# Patient Record
Sex: Female | Born: 1980
Health system: Southern US, Community
[De-identification: ages and names within clinical notes are randomized; demographics above are authoritative.]

## PROBLEM LIST (undated history)

## (undated) ENCOUNTER — Inpatient Hospital Stay (HOSPITAL_COMMUNITY): Payer: Self-pay

## (undated) DIAGNOSIS — J189 Pneumonia, unspecified organism: Secondary | ICD-10-CM

## (undated) DIAGNOSIS — T7840XA Allergy, unspecified, initial encounter: Secondary | ICD-10-CM

## (undated) DIAGNOSIS — J45909 Unspecified asthma, uncomplicated: Secondary | ICD-10-CM

## (undated) DIAGNOSIS — B009 Herpesviral infection, unspecified: Secondary | ICD-10-CM

## (undated) DIAGNOSIS — Z91030 Bee allergy status: Secondary | ICD-10-CM

## (undated) DIAGNOSIS — F419 Anxiety disorder, unspecified: Secondary | ICD-10-CM

## (undated) DIAGNOSIS — K219 Gastro-esophageal reflux disease without esophagitis: Secondary | ICD-10-CM

## (undated) DIAGNOSIS — Z8619 Personal history of other infectious and parasitic diseases: Secondary | ICD-10-CM

## (undated) HISTORY — DX: Gastro-esophageal reflux disease without esophagitis: K21.9

## (undated) HISTORY — DX: Herpesviral infection, unspecified: B00.9

## (undated) HISTORY — DX: Bee allergy status: Z91.030

## (undated) HISTORY — PX: GASTRIC BYPASS: SHX52

## (undated) HISTORY — DX: Anxiety disorder, unspecified: F41.9

## (undated) HISTORY — DX: Allergy, unspecified, initial encounter: T78.40XA

## (undated) HISTORY — PX: ABDOMINOPLASTY: SUR9

## (undated) HISTORY — DX: Unspecified asthma, uncomplicated: J45.909

## (undated) HISTORY — DX: Personal history of other infectious and parasitic diseases: Z86.19

---

## 2008-05-07 HISTORY — PX: KNEE ARTHROSCOPY: SHX127

## 2009-05-07 HISTORY — PX: ANKLE SURGERY: SHX546

## 2010-08-04 DIAGNOSIS — N926 Irregular menstruation, unspecified: Secondary | ICD-10-CM | POA: Insufficient documentation

## 2013-05-26 DIAGNOSIS — D251 Intramural leiomyoma of uterus: Secondary | ICD-10-CM | POA: Insufficient documentation

## 2015-07-06 HISTORY — PX: GASTRIC BYPASS: SHX52

## 2017-02-21 ENCOUNTER — Ambulatory Visit (INDEPENDENT_AMBULATORY_CARE_PROVIDER_SITE_OTHER): Payer: Managed Care, Other (non HMO) | Admitting: Physician Assistant

## 2017-02-21 ENCOUNTER — Encounter: Payer: Self-pay | Admitting: Physician Assistant

## 2017-02-21 DIAGNOSIS — J019 Acute sinusitis, unspecified: Secondary | ICD-10-CM | POA: Insufficient documentation

## 2017-02-21 DIAGNOSIS — B9689 Other specified bacterial agents as the cause of diseases classified elsewhere: Secondary | ICD-10-CM | POA: Insufficient documentation

## 2017-02-21 MED ORDER — AMOXICILLIN-POT CLAVULANATE 875-125 MG PO TABS: 1.0000 | ORAL_TABLET | Freq: Two times a day (BID) | ORAL | 0 refills | Status: DC

## 2017-02-21 NOTE — Progress Notes (Signed)
Pre visit review using our clinic review tool, if applicable. No additional management support is needed unless otherwise documented below in the visit note. 

## 2017-02-21 NOTE — Assessment & Plan Note (Signed)
Rx Augmentin.  Increase fluids.  Rest.  Saline nasal spray.  Probiotic.  Mucinex as directed.  Humidifier in bedroom. Allegra-D as instructed.  Call or return to clinic if symptoms are not improving.

## 2017-02-21 NOTE — Progress Notes (Signed)
Patient presents to clinic today to establish care.  Acute Concerns: Patient c/o 3 weeks of sinus pressure, nasal congestion, sinus pain, sinus headache, bilateral ear pain and occasional dizziness. Denies fever, chills, chest pain or SOB. Denies chest congestion, chest pain or SOB.  Is taking Allegra and Tylenol for sinus symptoms. Is limited due to history of Gastric Bypass.   Chronic Issues: Seasonal Allergies -- Allegra twice daily with relief of symptoms. Is followed by Allergy.   Health Maintenance: Immunizations -- Declines flu shot. States she never gets. Is unsure of last Tetanus. Will need records. Declines updated immunizations. PAP -- Patient endorses having in August of this year with GYN. Normal per patient.   Past Medical History:  Diagnosis Date  . Allergy    Seasonal  . Asthma   . Bee sting allergy   . History of chickenpox     Past Surgical History:  Procedure Laterality Date  . ANKLE SURGERY Right 2011  . GASTRIC BYPASS  07/2015  . KNEE ARTHROSCOPY Left 2010    No current outpatient prescriptions on file prior to visit.   No current facility-administered medications on file prior to visit.     Allergies  Allergen Reactions  . Naproxen Other (See Comments)    Hematuria when taken in large doses  . Tape Rash    Family History  Problem Relation Age of Onset  . Alcohol abuse Mother   . Heart attack Father 62  . Asthma Brother   . Diabetes Brother     Social History   Social History  . Marital status: Married    Spouse name: N/A  . Number of children: 2  . Years of education: 16   Occupational History  . Administration    Social History Main Topics  . Smoking status: Never Smoker  . Smokeless tobacco: Never Used  . Alcohol use Yes     Comment: Maybe 1 drink per week.  . Drug use: No  . Sexual activity: Yes    Birth control/ protection: Inserts     Comment: Nuvaring   Other Topics Concern  . Not on file   Social History  Narrative  . No narrative on file   Review of Systems  Constitutional: Negative for fever and weight loss.  HENT: Positive for congestion, nosebleeds and sinus pain. Negative for ear discharge, ear pain, hearing loss and tinnitus.   Eyes: Negative for blurred vision, double vision, photophobia and pain.  Respiratory: Positive for cough. Negative for shortness of breath.   Cardiovascular: Negative for chest pain and palpitations.  Gastrointestinal: Negative for abdominal pain, blood in stool, constipation, diarrhea, heartburn, melena, nausea and vomiting.  Genitourinary: Negative for dysuria, flank pain, frequency, hematuria and urgency.  Musculoskeletal: Negative for falls.  Neurological: Positive for dizziness and headaches. Negative for loss of consciousness.  Endo/Heme/Allergies: Negative for environmental allergies.  Psychiatric/Behavioral: Negative for depression, hallucinations, substance abuse and suicidal ideas. The patient is not nervous/anxious and does not have insomnia.    BP 120/86   Pulse 62   Temp 98.4 F (36.9 C) (Oral)   Resp 14   Ht 5' 5.75" (1.67 m)   Wt 226 lb (102.5 kg)   SpO2 98%   BMI 36.76 kg/m   Physical Exam  Constitutional: She is oriented to person, place, and time and well-developed, well-nourished, and in no distress.  HENT:  Head: Normocephalic and atraumatic.  Right Ear: A middle ear effusion is present.  Left Ear: A middle ear  effusion is present.  Nose: Mucosal edema and rhinorrhea present. Right sinus exhibits maxillary sinus tenderness. Left sinus exhibits maxillary sinus tenderness.  Mouth/Throat: Uvula is midline, oropharynx is clear and moist and mucous membranes are normal.  Neck: Neck supple.  Cardiovascular: Normal rate, regular rhythm, normal heart sounds and intact distal pulses.   Pulmonary/Chest: Effort normal and breath sounds normal. No respiratory distress. She has no wheezes. She has no rales. She exhibits no tenderness.    Lymphadenopathy:    She has no cervical adenopathy.  Neurological: She is alert and oriented to person, place, and time.  Skin: Skin is warm and dry. No rash noted.  Psychiatric: Affect normal.  Vitals reviewed.  Assessment/Plan: Acute bacterial sinusitis Rx Augmentin.  Increase fluids.  Rest.  Saline nasal spray.  Probiotic.  Mucinex as directed.  Humidifier in bedroom. Allegra-D as instructed.  Call or return to clinic if symptoms are not improving.     Leeanne Rio, PA-C

## 2017-02-21 NOTE — Patient Instructions (Signed)
Please take antibiotic as directed.  Increase fluid intake.  Use Saline nasal spray.  Take a daily multivitamin. Switch to Allegra-D for now.  Place a humidifier in the bedroom.  Please call or return clinic if symptoms are not improving.  Sinusitis Sinusitis is redness, soreness, and swelling (inflammation) of the paranasal sinuses. Paranasal sinuses are air pockets within the bones of your face (beneath the eyes, the middle of the forehead, or above the eyes). In healthy paranasal sinuses, mucus is able to drain out, and air is able to circulate through them by way of your nose. However, when your paranasal sinuses are inflamed, mucus and air can become trapped. This can allow bacteria and other germs to grow and cause infection. Sinusitis can develop quickly and last only a short time (acute) or continue over a long period (chronic). Sinusitis that lasts for more than 12 weeks is considered chronic.  CAUSES  Causes of sinusitis include:  Allergies.  Structural abnormalities, such as displacement of the cartilage that separates your nostrils (deviated septum), which can decrease the air flow through your nose and sinuses and affect sinus drainage.  Functional abnormalities, such as when the small hairs (cilia) that line your sinuses and help remove mucus do not work properly or are not present. SYMPTOMS  Symptoms of acute and chronic sinusitis are the same. The primary symptoms are pain and pressure around the affected sinuses. Other symptoms include:  Upper toothache.  Earache.  Headache.  Bad breath.  Decreased sense of smell and taste.  A cough, which worsens when you are lying flat.  Fatigue.  Fever.  Thick drainage from your nose, which often is green and may contain pus (purulent).  Swelling and warmth over the affected sinuses. DIAGNOSIS  Your caregiver will perform a physical exam. During the exam, your caregiver may:  Look in your nose for signs of abnormal growths in  your nostrils (nasal polyps).  Tap over the affected sinus to check for signs of infection.  View the inside of your sinuses (endoscopy) with a special imaging device with a light attached (endoscope), which is inserted into your sinuses. If your caregiver suspects that you have chronic sinusitis, one or more of the following tests may be recommended:  Allergy tests.  Nasal culture A sample of mucus is taken from your nose and sent to a lab and screened for bacteria.  Nasal cytology A sample of mucus is taken from your nose and examined by your caregiver to determine if your sinusitis is related to an allergy. TREATMENT  Most cases of acute sinusitis are related to a viral infection and will resolve on their own within 10 days. Sometimes medicines are prescribed to help relieve symptoms (pain medicine, decongestants, nasal steroid sprays, or saline sprays).  However, for sinusitis related to a bacterial infection, your caregiver will prescribe antibiotic medicines. These are medicines that will help kill the bacteria causing the infection.  Rarely, sinusitis is caused by a fungal infection. In theses cases, your caregiver will prescribe antifungal medicine. For some cases of chronic sinusitis, surgery is needed. Generally, these are cases in which sinusitis recurs more than 3 times per year, despite other treatments. HOME CARE INSTRUCTIONS   Drink plenty of water. Water helps thin the mucus so your sinuses can drain more easily.  Use a humidifier.  Inhale steam 3 to 4 times a day (for example, sit in the bathroom with the shower running).  Apply a warm, moist washcloth to your face 3  to 4 times a day, or as directed by your caregiver.  Use saline nasal sprays to help moisten and clean your sinuses.  Take over-the-counter or prescription medicines for pain, discomfort, or fever only as directed by your caregiver. SEEK IMMEDIATE MEDICAL CARE IF:  You have increasing pain or severe  headaches.  You have nausea, vomiting, or drowsiness.  You have swelling around your face.  You have vision problems.  You have a stiff neck.  You have difficulty breathing. MAKE SURE YOU:   Understand these instructions.  Will watch your condition.  Will get help right away if you are not doing well or get worse. Document Released: 04/23/2005 Document Revised: 07/16/2011 Document Reviewed: 05/08/2011 [facility] Patient Information 2014 [place], [place].

## 2017-04-22 ENCOUNTER — Telehealth: Payer: Self-pay | Admitting: Physician Assistant

## 2017-04-22 NOTE — Telephone Encounter (Signed)
Copied from [place] [id number]. Topic: Quick Communication - See Telephone Encounter >> Apr 22, 2017  4:42 PM Boyd Kerbs wrote: CRM for notification. See Telephone encounter for:  Patient called saying she is still having ear pain, eyes, nose pain, headaches, drainage.   [id number] - [place], [place] - [address] [place] [address] Phone: [phone] Fax: [phone]  Patient is waiting for callback.  Not sure if needs to come back in or if can call in another prescription   04/22/17.

## 2017-04-22 NOTE — Telephone Encounter (Signed)
Patient wanted to be seen tomorrow.   Scheduled her an acute appt with Dr. Jonni Sanger tomorrow since PCP is not available.

## 2017-04-23 ENCOUNTER — Ambulatory Visit: Payer: Managed Care, Other (non HMO) | Admitting: Family Medicine

## 2017-04-23 ENCOUNTER — Encounter: Payer: Self-pay | Admitting: Family Medicine

## 2017-04-23 VITALS — BP 120/80 | HR 67 | Temp 98.8°F | Ht 65.75 in | Wt 228.5 lb

## 2017-04-23 DIAGNOSIS — H1013 Acute atopic conjunctivitis, bilateral: Secondary | ICD-10-CM | POA: Diagnosis not present

## 2017-04-23 DIAGNOSIS — J329 Chronic sinusitis, unspecified: Secondary | ICD-10-CM | POA: Diagnosis not present

## 2017-04-23 DIAGNOSIS — J3089 Other allergic rhinitis: Secondary | ICD-10-CM | POA: Diagnosis not present

## 2017-04-23 MED ORDER — OLOPATADINE HCL 0.1 % OP SOLN: 1.0000 [drp] | Freq: Two times a day (BID) | OPHTHALMIC | 1 refills | Status: DC

## 2017-04-23 MED ORDER — DOXYCYCLINE HYCLATE 100 MG PO TABS
100.0000 mg | ORAL_TABLET | Freq: Two times a day (BID) | ORAL | 0 refills | Status: AC
Start: 2017-04-23 — End: 2017-04-30

## 2017-04-23 MED ORDER — PREDNISONE 10 MG PO TABS: ORAL_TABLET | ORAL | 0 refills | Status: DC

## 2017-04-23 NOTE — Progress Notes (Signed)
Subjective   CC:  Chief Complaint  Patient presents with  . Sinus Problem  . BIlateral ear pain  . Nasal Congestion    draining  . Headache    HPI: Zoe Soto is a 36 y.o. female who presents to the office today to address the problems listed above in the chief complaint.  Patient reports sinus congestion and pressure with thick drainage, mild nonproductive cough, ear pressure without pain, and mild malaise.  Symptoms have been present since oct 18th; she was treated at that time with augmentin for an acute sinus infection, however reports NO significant improvement in her sxs. She c/o right sinu pain since yesterday w/o dental pain or purulent drainage.  She denies high fevers, GI symptoms, shortness of breath. Shehas had sinus infections in the past and this feels similar. She has moderate to severe allergies; formerly had immunotherapy. Has never been evaluated by ENT and reports taking 5-6 rounds of abx per year for "sinus infections"  Patient is a non-smoker.  No active asthma sxs. Eyes are itching as well. She is on multiple allergy medications  I reviewed the patients updated PMH, FH, and SocHx.    Patient Active Problem List   Diagnosis Date Noted  . Acute bacterial sinusitis 02/21/2017  . Intramural leiomyoma of uterus 05/26/2013  . Irregular menstruation 08/04/2010   Current Meds  Medication Sig  . albuterol (PROVENTIL HFA;VENTOLIN HFA) 108 (90 Base) MCG/ACT inhaler Inhale 2 puffs into the lungs as needed.  . Biotin 1 MG CAPS Take 2 capsules by mouth daily.  . budesonide-formoterol (SYMBICORT) 160-4.5 MCG/ACT inhaler Inhale 2 puffs into the lungs as needed.   . Calcium Carbonate-Vitamin D (CALCIUM 600+D) 600-200 MG-UNIT TABS Take 1 tablet by mouth daily.  . cyanocobalamin (V-R VITAMIN B-12) 500 MCG tablet Take 1 tablet by mouth daily.  Marland Kitchen EPINEPHrine 0.3 mg/0.3 mL IJ SOAJ injection Inject 0.3 mg by intramuscular injection one time only.  . ferrous sulfate 325 (65 FE)  MG tablet Take 1 tablet by mouth daily.  . fexofenadine (ALLEGRA) 180 MG tablet TAKE 2 TABLETS BY MOUTH TWICE DAILY  . Multiple Vitamin (MULTI-VITAMINS) TABS Take 1 Tab by mouth daily.  Marland Kitchen NUVARING 0.12-0.015 MG/24HR vaginal ring INSERT 1 RING VAGINALLY FOR 3 WEEKS, THEN REMOVE FOR 1 WEEK, THEN REPEAT    Review of Systems: Cardiovascular: negative for chest pain Respiratory: negative for SOB or persistent cough Gastrointestinal: negative for abdominal pain Genitourinary: negative for dysuria or gross hematuria  Objective  Vitals: BP 120/80 (BP Location: Left Arm, Patient Position: Sitting, Cuff Size: Large)   Pulse 67   Temp 98.8 F (37.1 C) (Oral)   Ht 5' 5.75" (1.67 m)   Wt 228 lb 8 oz (103.6 kg)   LMP 03/31/2017   SpO2 97%   BMI 37.16 kg/m  General: no acute distress  Psych:  Alert and oriented, normal mood and affect HEENT:  Normocephalic, atraumatic, conjunctiva are clear bilaterally, TMs with serous effusions or retraction w/o erythema, nasal mucosa is pink without purulent drainage, tender maxillary sinus present, OP mild erythematous w/o eudate, supple neck without LAD Cardiovascular:  RRR without murmur or gallop. no peripheral edema Respiratory:  Good breath sounds bilaterally, CTAB with normal respiratory effort Skin:  Warm, no rashes   Assessment  1. Recurrent sinus infections   2. Non-seasonal allergic rhinitis, unspecified trigger   3. Acute atopic conjunctivitis of both eyes      Plan   Sinusitis: History and exam is most consistent  with allergies +/- a recurrent bacterial sinus infection.  Etiology and prognosis discussed with patient. It is difficult to discern at this point.  Recommend antibiotics as ordered below.  Patient to complete course of antibiotics, use supportive medications like mucolytics and decongestants as needed.  Add prednisone and refer to ENT for further evaluation. I recommend reconsidering allergy treatments if ENT eval is negative. May use  Tylenol or Advil if needed.    Follow up: as needed    Commons side effects, risks, benefits, and alternatives for medications and treatment plan prescribed today were discussed, and the patient expressed understanding of the given instructions. Patient is instructed to call or message via MyChart if he/she has any questions or concerns regarding our treatment plan. No barriers to understanding were identified. We discussed Red Flag symptoms and signs in detail. Patient expressed understanding regarding what to do in case of urgent or emergency type symptoms.   Medication list was reconciled, printed and provided to the patient in AVS. Patient instructions and summary information was reviewed with the patient as documented in the AVS. This note was prepared with assistance of Dragon voice recognition software. Occasional wrong-word or sound-a-like substitutions may have occurred due to the inherent limitations of voice recognition software  Orders Placed This Encounter  Procedures  . Ambulatory referral to ENT   Meds ordered this encounter  Medications  . doxycycline (VIBRA-TABS) 100 MG tablet    Sig: Take 1 tablet (100 mg total) by mouth 2 (two) times daily for 7 days.    Dispense:  14 tablet    Refill:  0  . predniSONE (DELTASONE) 10 MG tablet    Sig: Take 4 tabs qd x 2 days, 3 qd x 2 days, 2 qd x 2d, 1qd x 3 days    Dispense:  21 tablet    Refill:  0  . olopatadine (PATANOL) 0.1 % ophthalmic solution    Sig: Place 1 drop into both eyes 2 (two) times daily.    Dispense:  5 mL    Refill:  1

## 2017-04-23 NOTE — Patient Instructions (Signed)
We will call you with information regarding your referral appointment. ENT specialist  Consider getting back to an allergist as well.

## 2017-05-31 ENCOUNTER — Ambulatory Visit: Payer: Managed Care, Other (non HMO) | Admitting: Physician Assistant

## 2017-05-31 ENCOUNTER — Encounter: Payer: Self-pay | Admitting: Physician Assistant

## 2017-05-31 ENCOUNTER — Other Ambulatory Visit: Payer: Self-pay

## 2017-05-31 VITALS — BP 110/80 | HR 62 | Temp 98.3°F | Resp 14 | Ht 66.0 in | Wt 224.0 lb

## 2017-05-31 DIAGNOSIS — J069 Acute upper respiratory infection, unspecified: Secondary | ICD-10-CM | POA: Diagnosis not present

## 2017-05-31 LAB — POC INFLUENZA A&B (BINAX/QUICKVUE)
INFLUENZA A, POC: NEGATIVE
INFLUENZA B, POC: NEGATIVE

## 2017-05-31 NOTE — Progress Notes (Signed)
Patient presents to clinic today c/o 2 days of sinus pressure, nasal congestion and fatigue. Endorses dry cough. Denies fever, chills, sinus pain, ear pain or tooth pain. Denies chest pain or SOB. Has not taken anything for symptoms.  Past Medical History:  Diagnosis Date  . Allergy    Seasonal  . Asthma   . Bee sting allergy   . History of chickenpox     Current Outpatient Medications on File Prior to Visit  Medication Sig Dispense Refill  . albuterol (PROVENTIL HFA;VENTOLIN HFA) 108 (90 Base) MCG/ACT inhaler Inhale 2 puffs into the lungs as needed.    . Biotin 1 MG CAPS Take 2 capsules by mouth daily.    . budesonide-formoterol (SYMBICORT) 160-4.5 MCG/ACT inhaler Inhale 2 puffs into the lungs as needed.     . Calcium Carbonate-Vitamin D (CALCIUM 600+D) 600-200 MG-UNIT TABS Take 1 tablet by mouth daily.    . cyanocobalamin (V-R VITAMIN B-12) 500 MCG tablet Take 1 tablet by mouth daily.    Marland Kitchen EPINEPHrine 0.3 mg/0.3 mL IJ SOAJ injection Inject 0.3 mg by intramuscular injection one time only.    . ferrous sulfate 325 (65 FE) MG tablet Take 1 tablet by mouth daily.    . fexofenadine (ALLEGRA) 180 MG tablet TAKE 2 TABLETS BY MOUTH TWICE DAILY    . Multiple Vitamin (MULTI-VITAMINS) TABS Take 1 Tab by mouth daily.    Marland Kitchen NUVARING 0.12-0.015 MG/24HR vaginal ring INSERT 1 RING VAGINALLY FOR 3 WEEKS, THEN REMOVE FOR 1 WEEK, THEN REPEAT  11  . olopatadine (PATANOL) 0.1 % ophthalmic solution Place 1 drop into both eyes 2 (two) times daily. 5 mL 1   No current facility-administered medications on file prior to visit.     Allergies  Allergen Reactions  . Naproxen Other (See Comments)    Hematuria when taken in large doses  . Tape Rash    Family History  Problem Relation Age of Onset  . Alcohol abuse Mother   . Heart attack Father 3  . Asthma Brother   . Diabetes Brother     Social History   Socioeconomic History  . Marital status: Married    Spouse name: None  . Number of  children: 2  . Years of education: 65  . Highest education level: None  Social Needs  . Financial resource strain: None  . Food insecurity - worry: None  . Food insecurity - inability: None  . Transportation needs - medical: None  . Transportation needs - non-medical: None  Occupational History  . Occupation: Administration  Tobacco Use  . Smoking status: Never Smoker  . Smokeless tobacco: Never Used  Substance and Sexual Activity  . Alcohol use: Yes    Comment: Maybe 1 drink per week.  . Drug use: No  . Sexual activity: Yes    Birth control/protection: Inserts    Comment: Nuvaring  Other Topics Concern  . None  Social History Narrative  . None   Review of Systems - See HPI.  All other ROS are negative.  BP 110/80   Pulse 62   Temp 98.3 F (36.8 C) (Oral)   Resp 14   Ht 5\' 6"  (1.676 m)   Wt 224 lb (101.6 kg)   SpO2 98%   BMI 36.15 kg/m   Physical Exam  Constitutional: She is oriented to person, place, and time and well-developed, well-nourished, and in no distress.  HENT:  Head: Normocephalic and atraumatic.  Right Ear: External ear normal.  Left  Ear: External ear normal.  Nose: Nose normal.  Mouth/Throat: Oropharynx is clear and moist. No oropharyngeal exudate.  TM within normal limits bilaterally.   Eyes: Conjunctivae are normal.  Cardiovascular: Normal rate, regular rhythm, normal heart sounds and intact distal pulses.  Pulmonary/Chest: Effort normal and breath sounds normal. No respiratory distress. She has no wheezes. She has no rales. She exhibits no tenderness.  Neurological: She is alert and oriented to person, place, and time.  Skin: Skin is warm and dry. No rash noted.  Vitals reviewed.  Assessment/Plan: 1. Viral URI Flu swab negative. Reviewed supportive measures and OTC medications. Follow-up discussed. - POC Influenza A&B(BINAX/QUICKVUE)   Leeanne Rio, PA-C

## 2017-05-31 NOTE — Patient Instructions (Signed)
Please stay well-hydrated and get plenty of rest.  Continue chronic allergy medications adding on Flonase. Tylenol sinus if needed for headache and congestion.  If symptoms are not improving or if anything worsens, or new symptoms develop, please give [place] a call.

## 2017-07-12 ENCOUNTER — Other Ambulatory Visit: Payer: Self-pay

## 2017-07-12 ENCOUNTER — Encounter: Payer: Self-pay | Admitting: Physician Assistant

## 2017-07-12 ENCOUNTER — Ambulatory Visit: Payer: Managed Care, Other (non HMO) | Admitting: Physician Assistant

## 2017-07-12 ENCOUNTER — Other Ambulatory Visit: Payer: Self-pay | Admitting: Physician Assistant

## 2017-07-12 DIAGNOSIS — B9689 Other specified bacterial agents as the cause of diseases classified elsewhere: Secondary | ICD-10-CM | POA: Diagnosis not present

## 2017-07-12 DIAGNOSIS — J019 Acute sinusitis, unspecified: Secondary | ICD-10-CM | POA: Diagnosis not present

## 2017-07-12 MED ORDER — DOXYCYCLINE HYCLATE 100 MG PO CAPS
100.0000 mg | ORAL_CAPSULE | Freq: Two times a day (BID) | ORAL | 0 refills | Status: DC
Start: 2017-07-12 — End: 2017-07-16

## 2017-07-12 MED ORDER — AZELASTINE HCL 0.1 % NA SOLN: 2.0000 | Freq: Two times a day (BID) | NASAL | 12 refills | Status: DC

## 2017-07-12 NOTE — Patient Instructions (Signed)
Please take antibiotic as directed.  Increase fluid intake.  Use Saline nasal spray or Neti Pot with distilled water. Take a daily multivitamin. Restart Allegra twice daily. I have sent in a prescription for Astelin spray to use as directed.  Place a humidifier in the bedroom.  Please call or return clinic if symptoms are not improving.  Sinusitis Sinusitis is redness, soreness, and swelling (inflammation) of the paranasal sinuses. Paranasal sinuses are air pockets within the bones of your face (beneath the eyes, the middle of the forehead, or above the eyes). In healthy paranasal sinuses, mucus is able to drain out, and air is able to circulate through them by way of your nose. However, when your paranasal sinuses are inflamed, mucus and air can become trapped. This can allow bacteria and other germs to grow and cause infection. Sinusitis can develop quickly and last only a short time (acute) or continue over a long period (chronic). Sinusitis that lasts for more than 12 weeks is considered chronic.  CAUSES  Causes of sinusitis include:  Allergies.  Structural abnormalities, such as displacement of the cartilage that separates your nostrils (deviated septum), which can decrease the air flow through your nose and sinuses and affect sinus drainage.  Functional abnormalities, such as when the small hairs (cilia) that line your sinuses and help remove mucus do not work properly or are not present. SYMPTOMS  Symptoms of acute and chronic sinusitis are the same. The primary symptoms are pain and pressure around the affected sinuses. Other symptoms include:  Upper toothache.  Earache.  Headache.  Bad breath.  Decreased sense of smell and taste.  A cough, which worsens when you are lying flat.  Fatigue.  Fever.  Thick drainage from your nose, which often is green and may contain pus (purulent).  Swelling and warmth over the affected sinuses. DIAGNOSIS  Your caregiver will perform a  physical exam. During the exam, your caregiver may:  Look in your nose for signs of abnormal growths in your nostrils (nasal polyps).  Tap over the affected sinus to check for signs of infection.  View the inside of your sinuses (endoscopy) with a special imaging device with a light attached (endoscope), which is inserted into your sinuses. If your caregiver suspects that you have chronic sinusitis, one or more of the following tests may be recommended:  Allergy tests.  Nasal culture A sample of mucus is taken from your nose and sent to a lab and screened for bacteria.  Nasal cytology A sample of mucus is taken from your nose and examined by your caregiver to determine if your sinusitis is related to an allergy. TREATMENT  Most cases of acute sinusitis are related to a viral infection and will resolve on their own within 10 days. Sometimes medicines are prescribed to help relieve symptoms (pain medicine, decongestants, nasal steroid sprays, or saline sprays).  However, for sinusitis related to a bacterial infection, your caregiver will prescribe antibiotic medicines. These are medicines that will help kill the bacteria causing the infection.  Rarely, sinusitis is caused by a fungal infection. In theses cases, your caregiver will prescribe antifungal medicine. For some cases of chronic sinusitis, surgery is needed. Generally, these are cases in which sinusitis recurs more than 3 times per year, despite other treatments. HOME CARE INSTRUCTIONS   Drink plenty of water. Water helps thin the mucus so your sinuses can drain more easily.  Use a humidifier.  Inhale steam 3 to 4 times a day (for example, sit  in the bathroom with the shower running).  Apply a warm, moist washcloth to your face 3 to 4 times a day, or as directed by your caregiver.  Use saline nasal sprays to help moisten and clean your sinuses.  Take over-the-counter or prescription medicines for pain, discomfort, or fever only  as directed by your caregiver. SEEK IMMEDIATE MEDICAL CARE IF:  You have increasing pain or severe headaches.  You have nausea, vomiting, or drowsiness.  You have swelling around your face.  You have vision problems.  You have a stiff neck.  You have difficulty breathing. MAKE SURE YOU:   Understand these instructions.  Will watch your condition.  Will get help right away if you are not doing well or get worse. Document Released: 04/23/2005 Document Revised: 07/16/2011 Document Reviewed: 05/08/2011 [facility] Patient Information 2014 [place], [place].

## 2017-07-12 NOTE — Assessment & Plan Note (Signed)
Rx Doxycycline.  Increase fluids.  Rest.  Saline nasal spray.  Probiotic.  Mucinex as directed.  Humidifier in bedroom. Restart Allegra BID.  Call or return to clinic if symptoms are not improving as ENT may be needed.

## 2017-07-12 NOTE — Progress Notes (Signed)
Patient presents to clinic today c/o several weeks of sinus pressure/sinus pain, maxillary pain, sore throat, headache. Denies fever, but notes chills and some fatigue. Denies chest pain or SOB. Has year-round allergies, currently on Allegra. Is supposed to take BID but only takes once daily.   Past Medical History:  Diagnosis Date  . Allergy    Seasonal  . Asthma   . Bee sting allergy   . History of chickenpox     Current Outpatient Medications on File Prior to Visit  Medication Sig Dispense Refill  . albuterol (PROVENTIL HFA;VENTOLIN HFA) 108 (90 Base) MCG/ACT inhaler Inhale 2 puffs into the lungs as needed.    . Biotin 1 MG CAPS Take 2 capsules by mouth daily.    . budesonide-formoterol (SYMBICORT) 160-4.5 MCG/ACT inhaler Inhale 2 puffs into the lungs as needed.     . Calcium Carbonate-Vitamin D (CALCIUM 600+D) 600-200 MG-UNIT TABS Take 1 tablet by mouth daily.    . cyanocobalamin (V-R VITAMIN B-12) 500 MCG tablet Take 1 tablet by mouth daily.    Marland Kitchen EPINEPHrine 0.3 mg/0.3 mL IJ SOAJ injection Inject 0.3 mg by intramuscular injection one time only.    . ferrous sulfate 325 (65 FE) MG tablet Take 1 tablet by mouth daily.    . fexofenadine (ALLEGRA) 180 MG tablet TAKE 2 TABLETS BY MOUTH TWICE DAILY    . Multiple Vitamin (MULTI-VITAMINS) TABS Take 1 Tab by mouth daily.    Marland Kitchen NUVARING 0.12-0.015 MG/24HR vaginal ring INSERT 1 RING VAGINALLY FOR 3 WEEKS, THEN REMOVE FOR 1 WEEK, THEN REPEAT  11  . olopatadine (PATANOL) 0.1 % ophthalmic solution Place 1 drop into both eyes 2 (two) times daily. 5 mL 1   No current facility-administered medications on file prior to visit.     Allergies  Allergen Reactions  . Naproxen Other (See Comments)    Hematuria when taken in large doses  . Tape Rash    Family History  Problem Relation Age of Onset  . Alcohol abuse Mother   . Heart attack Father 86  . Asthma Brother   . Diabetes Brother     Social History   Socioeconomic History  .  Marital status: Married    Spouse name: None  . Number of children: 2  . Years of education: 63  . Highest education level: None  Social Needs  . Financial resource strain: None  . Food insecurity - worry: None  . Food insecurity - inability: None  . Transportation needs - medical: None  . Transportation needs - non-medical: None  Occupational History  . Occupation: Administration  Tobacco Use  . Smoking status: Never Smoker  . Smokeless tobacco: Never Used  Substance and Sexual Activity  . Alcohol use: Yes    Comment: Maybe 1 drink per week.  . Drug use: No  . Sexual activity: Yes    Birth control/protection: Inserts    Comment: Nuvaring  Other Topics Concern  . None  Social History Narrative  . None   Review of Systems - See HPI.  All other ROS are negative.  BP 120/84   Pulse 62   Temp 97.6 F (36.4 C) (Oral)   Resp 16   Ht 5\' 6"  (1.676 m)   Wt 229 lb (103.9 kg)   SpO2 99%   BMI 36.96 kg/m   Physical Exam  Constitutional: She is oriented to person, place, and time and well-developed, well-nourished, and in no distress.  HENT:  Head: Normocephalic and atraumatic.  Right Ear: Tympanic membrane normal.  Left Ear: Tympanic membrane normal.  Nose: Mucosal edema and rhinorrhea present. Right sinus exhibits maxillary sinus tenderness. Right sinus exhibits no frontal sinus tenderness. Left sinus exhibits maxillary sinus tenderness. Left sinus exhibits no frontal sinus tenderness.  Mouth/Throat: Uvula is midline, oropharynx is clear and moist and mucous membranes are normal.  Eyes: Conjunctivae are normal.  Neck: Neck supple.  Cardiovascular: Normal rate, regular rhythm, normal heart sounds and intact distal pulses.  Pulmonary/Chest: Effort normal and breath sounds normal. No respiratory distress. She has no wheezes. She has no rales. She exhibits no tenderness.  Lymphadenopathy:    She has no cervical adenopathy.  Neurological: She is alert and oriented to person,  place, and time.  Skin: Skin is warm and dry. No rash noted.  Psychiatric: Affect normal.  Vitals reviewed.   Recent Results (from the past 2160 hour(s))  POC Influenza A&B(BINAX/QUICKVUE)     Status: Normal   Collection Time: 05/31/17 11:47 AM  Result Value Ref Range   Influenza A, POC Negative Negative   Influenza B, POC Negative Negative    Assessment/Plan: Acute bacterial sinusitis Rx Doxycycline.  Increase fluids.  Rest.  Saline nasal spray.  Probiotic.  Mucinex as directed.  Humidifier in bedroom. Restart Allegra BID.  Call or return to clinic if symptoms are not improving as ENT may be needed.     Leeanne Rio, PA-C

## 2017-07-16 ENCOUNTER — Ambulatory Visit: Payer: Self-pay

## 2017-07-16 MED ORDER — AMOXICILLIN 875 MG PO TABS: 875.0000 mg | ORAL_TABLET | Freq: Two times a day (BID) | ORAL | 0 refills | Status: DC

## 2017-07-16 NOTE — Addendum Note (Signed)
Addended by: Katina Dung on: 07/16/2017 10:10 AM   Modules accepted: Orders

## 2017-07-16 NOTE — Telephone Encounter (Signed)
Medication has been called in. Patient is aware to stop Doxycycline and start Amoxicillin.  Aware to call back if sxs not improved.

## 2017-07-16 NOTE — Telephone Encounter (Signed)
Ret'd call to pt.  Reported she cannot tolerate Doxycycline.  C/o nausea, and abdominal cramping that goes around to her back.  Stated the cramping is severe at times.  Reported her symptoms correlate with each dose of the antibiotic.  Also, reported residual shooting pain in her abdomen.  Denied any vomiting; felt like vomiting, but has not. Reported she has taken 4 doses of Doxycycline; last dose taken PM of 3/11.  Advised will make Zoe Soto aware.  Agrees with plan.       Reason for Disposition . Caller has NON-URGENT medication question about med that PCP prescribed and triager unable to answer question  Answer Assessment - Initial Assessment Questions 1. SYMPTOMS: "Do you have any symptoms?"     C/o nausea, stomach cramps, back pain, increased stools, but not diarrhea 2. SEVERITY: If symptoms are present, ask "Are they mild, moderate or severe?"     severe  Protocols used: MEDICATION QUESTION CALL-A-AH

## 2017-07-16 NOTE — Telephone Encounter (Signed)
Stop the Doxycycline.  Start Amoxicillin 875 mg BID x 7 days.  If abdominal symptoms are not improving with cessation of the Doxycycline, she needs to be seen in office.

## 2017-08-05 DIAGNOSIS — J189 Pneumonia, unspecified organism: Secondary | ICD-10-CM

## 2017-08-05 HISTORY — DX: Pneumonia, unspecified organism: J18.9

## 2017-08-10 ENCOUNTER — Other Ambulatory Visit: Payer: Self-pay | Admitting: Physician Assistant

## 2017-08-12 ENCOUNTER — Telehealth: Payer: Self-pay | Admitting: Physician Assistant

## 2017-08-12 NOTE — Telephone Encounter (Signed)
Last OV 07/12/2017  Last filled today, #120 with 0 refills (TAKE 2 TABLETS BY MOUTH TWICE DAILY)  Please advise.

## 2017-08-12 NOTE — Telephone Encounter (Signed)
Tell [facility] to cancel Rx. Patient will have to get from her Allergist who has previously prescribed.

## 2017-08-12 NOTE — Telephone Encounter (Signed)
Called and spoke to [place] at [facility] [place] and she is cancelling the order from [place] for the [place]. Gave her message from [place].

## 2017-08-12 NOTE — Telephone Encounter (Signed)
Copied from [place] [id number]. Topic: Quick Communication - See Telephone Encounter >> Aug 12, 2017 10:00 AM Ether Griffins B wrote: CRM for notification. See Telephone encounter for: 08/12/17. [facility] calling to verify fexofenadine (ALLEGRA) 180 MG tablet. Reccommended daily dose is 180 mg and the pt would be taking 720 mg daily. Call back number for [facility] [phone]

## 2017-08-26 ENCOUNTER — Other Ambulatory Visit: Payer: Self-pay

## 2017-08-26 ENCOUNTER — Ambulatory Visit: Payer: Managed Care, Other (non HMO) | Admitting: Physician Assistant

## 2017-08-26 ENCOUNTER — Encounter: Payer: Self-pay | Admitting: Physician Assistant

## 2017-08-26 VITALS — BP 138/90 | HR 78 | Temp 98.5°F | Resp 18 | Ht 66.0 in | Wt 224.0 lb

## 2017-08-26 DIAGNOSIS — J189 Pneumonia, unspecified organism: Secondary | ICD-10-CM

## 2017-08-26 DIAGNOSIS — J181 Lobar pneumonia, unspecified organism: Secondary | ICD-10-CM | POA: Diagnosis not present

## 2017-08-26 MED ORDER — FLUTICASONE PROPIONATE 50 MCG/ACT NA SUSP: 2.0000 | Freq: Every day | NASAL | 0 refills | Status: DC

## 2017-08-26 MED ORDER — BENZONATATE 100 MG PO CAPS: 100.0000 mg | ORAL_CAPSULE | Freq: Two times a day (BID) | ORAL | 0 refills | Status: DC | PRN

## 2017-08-26 NOTE — Patient Instructions (Signed)
Please continue the Azithromycin and Prednisone as directed. Start Flonase (a prescription has been sent) to help with nasal inflammation and rhinorrhea.  Saline nasal rinses will also help. Use the Tessalon as directed for cough and start Plain Mucinex. Continue breathing medications as directed.  We will be calling you Wed morning to check up on you. Call or return to clinic if symptoms are worsening.  This will take some time to get better but it sounds like symptoms overall are starting to improve.

## 2017-08-26 NOTE — Progress Notes (Signed)
Patient presents to clinic today c/o continued cough and chest congestion. Patient was seen at [facility] two days ago where she was diagnosed with a right lower lobe pneumonia. States she was given prednisone and azithromycin to take as directed. Has taken and noted improved in congestion. Sarita Haver is still persistent but less productive. Notes resolution of fever. Notes significant fatigue. Notes sinus pressure that is new. Denies ear pain, sinus pain or tooth pain.    Past Medical History:  Diagnosis Date  . Allergy    Seasonal  . Asthma   . Bee sting allergy   . History of chickenpox     Current Outpatient Medications on File Prior to Visit  Medication Sig Dispense Refill  . albuterol (PROVENTIL HFA;VENTOLIN HFA) 108 (90 Base) MCG/ACT inhaler Inhale 2 puffs into the lungs as needed.    Marland Kitchen azelastine (ASTELIN) 0.1 % nasal spray Place 2 sprays into both nostrils 2 (two) times daily. Use in each nostril as directed 30 mL 12  . azithromycin (ZITHROMAX) 250 MG tablet Take 250 mg by mouth as directed.     . Biotin 1 MG CAPS Take 2 capsules by mouth daily.    . budesonide-formoterol (SYMBICORT) 160-4.5 MCG/ACT inhaler Inhale 2 puffs into the lungs as needed.     . Calcium Carbonate-Vitamin D (CALCIUM 600+D) 600-200 MG-UNIT TABS Take 1 tablet by mouth daily.    . cyanocobalamin (V-R VITAMIN B-12) 500 MCG tablet Take 1 tablet by mouth daily.    Marland Kitchen EPINEPHrine 0.3 mg/0.3 mL IJ SOAJ injection Inject 0.3 mg by intramuscular injection one time only.    . ferrous sulfate 325 (65 FE) MG tablet Take 1 tablet by mouth daily.    . fexofenadine (ALLEGRA) 180 MG tablet TAKE 2 TABLETS BY MOUTH TWICE DAILY 120 tablet 0  . Multiple Vitamin (MULTI-VITAMINS) TABS Take 1 Tab by mouth daily.    Marland Kitchen NUVARING 0.12-0.015 MG/24HR vaginal ring INSERT 1 RING VAGINALLY FOR 3 WEEKS, THEN REMOVE FOR 1 WEEK, THEN REPEAT  11  . olopatadine (PATANOL) 0.1 % ophthalmic solution Place 1 drop into both eyes 2 (two) times daily. 5 mL 1  .  predniSONE (DELTASONE) 5 MG tablet Take 5 mg by mouth as directed.      No current facility-administered medications on file prior to visit.     Allergies  Allergen Reactions  . Naproxen Other (See Comments)    Hematuria when taken in large doses  . Tape Rash    Family History  Problem Relation Age of Onset  . Alcohol abuse Mother   . Heart attack Father 87  . Asthma Brother   . Diabetes Brother     Social History   Socioeconomic History  . Marital status: Married    Spouse name: Not on file  . Number of children: 2  . Years of education: 75  . Highest education level: Not on file  Occupational History  . Occupation: Administration  Social Needs  . Financial resource strain: Not on file  . Food insecurity:    Worry: Not on file    Inability: Not on file  . Transportation needs:    Medical: Not on file    Non-medical: Not on file  Tobacco Use  . Smoking status: Never Smoker  . Smokeless tobacco: Never Used  Substance and Sexual Activity  . Alcohol use: Yes    Comment: Maybe 1 drink per week.  . Drug use: No  . Sexual activity: Yes  Birth control/protection: Inserts    Comment: Nuvaring  Lifestyle  . Physical activity:    Days per week: Not on file    Minutes per session: Not on file  . Stress: Not on file  Relationships  . Social connections:    Talks on phone: Not on file    Gets together: Not on file    Attends religious service: Not on file    Active member of club or organization: Not on file    Attends meetings of clubs or organizations: Not on file    Relationship status: Not on file  Other Topics Concern  . Not on file  Social History Narrative  . Not on file   Review of Systems - See HPI.  All other ROS are negative.  BP 138/90   Pulse 78   Temp 98.5 F (36.9 C) (Oral)   Resp 18   Ht 5\' 6"  (1.676 m)   Wt 224 lb (101.6 kg)   SpO2 97%   BMI 36.15 kg/m   Physical Exam  Constitutional: She appears well-developed and well-nourished.    HENT:  Head: Normocephalic and atraumatic.  Right Ear: External ear normal. A middle ear effusion (serous) is present.  Left Ear: External ear normal.  No middle ear effusion.  Nose: Mucosal edema and rhinorrhea present. Right sinus exhibits no maxillary sinus tenderness and no frontal sinus tenderness. Left sinus exhibits no maxillary sinus tenderness and no frontal sinus tenderness.  Mouth/Throat: Oropharynx is clear and moist.  Eyes: Conjunctivae are normal.  Neck: Normal range of motion.  Cardiovascular: Normal rate, regular rhythm and normal heart sounds.  Pulmonary/Chest: Effort normal. No stridor. No respiratory distress. She has no wheezes. She has no rales. She exhibits tenderness.  Decreased sounds in RLL consistent with diagnosis of CAP  Skin: Skin is warm.  Psychiatric: She has a normal mood and affect.   Recent Results (from the past 2160 hour(s))  POC Influenza A&B(BINAX/QUICKVUE)     Status: Normal   Collection Time: 05/31/17 11:47 AM  Result Value Ref Range   Influenza A, POC Negative Negative   Influenza B, POC Negative Negative    Assessment/Plan: 1. Community acquired pneumonia of right lower lobe of lung ([place]) Diagnosed 2 days ago via CXR at [facility]. Will obtain records for review. Vitals are stable today. Patient has only had 2 doses of antibiotic. She is to continue as directed along with prednisone burst. Increase fluids. Start Flonase. Rx Tessalon for cough. Close follow-up and strict return precautions discussed.    Leeanne Rio, PA-C

## 2017-08-29 ENCOUNTER — Telehealth: Payer: Self-pay

## 2017-08-29 NOTE — Telephone Encounter (Signed)
Called patient and she stated that she is not as bad as she was in the beginning but she still has a lot of chest congestion and has only taken 1 Mucinex daily. I advised to read the box to make sure she can take 2 X's daily as her chest congestion is worse at night.  Patient still has a non productive cough and has not taken much of the Tessalon because she feels like she is on too much medication but she was advised to take to help with her cough as she does still have back pain when her cough is bad. Patient stated that she has not had to use the Flonase much and she does not feel like she has a fever. Patient did state that she feels like she is not improving as fast as she thought she would and feels like she is at a stand still.

## 2017-08-29 NOTE — Telephone Encounter (Signed)
Unfortunately with a pneumonia it will take some time for things to resolve. Things should improve slowly but head in the right direction each subsequent day. Continue care discussed at visit. Follow-up early next week in office if symptoms are not much improved/resolving at that time.

## 2017-08-30 NOTE — Telephone Encounter (Signed)
Patient is aware to call us back and make an appointment if her symptoms do not improve. We will check on her next week to make sure she is having improvement.

## 2017-09-17 ENCOUNTER — Other Ambulatory Visit: Payer: Self-pay | Admitting: Physician Assistant

## 2017-11-19 ENCOUNTER — Encounter (HOSPITAL_COMMUNITY): Payer: Self-pay | Admitting: *Deleted

## 2017-11-19 ENCOUNTER — Inpatient Hospital Stay (HOSPITAL_COMMUNITY)
Admission: AD | Admit: 2017-11-19 | Discharge: 2017-11-20 | Disposition: A | Discharge status: Home / Self Care | Payer: Managed Care, Other (non HMO) | Source: Ambulatory Visit | Attending: Obstetrics and Gynecology | Admitting: Obstetrics and Gynecology

## 2017-11-19 DIAGNOSIS — Z3A14 14 weeks gestation of pregnancy: Secondary | ICD-10-CM | POA: Insufficient documentation

## 2017-11-19 DIAGNOSIS — X58XXXA Exposure to other specified factors, initial encounter: Secondary | ICD-10-CM | POA: Insufficient documentation

## 2017-11-19 DIAGNOSIS — Z888 Allergy status to other drugs, medicaments and biological substances status: Secondary | ICD-10-CM | POA: Insufficient documentation

## 2017-11-19 DIAGNOSIS — O9A212 Injury, poisoning and certain other consequences of external causes complicating pregnancy, second trimester: Secondary | ICD-10-CM | POA: Diagnosis not present

## 2017-11-19 DIAGNOSIS — Z886 Allergy status to analgesic agent status: Secondary | ICD-10-CM | POA: Insufficient documentation

## 2017-11-19 DIAGNOSIS — J45909 Unspecified asthma, uncomplicated: Secondary | ICD-10-CM | POA: Diagnosis not present

## 2017-11-19 DIAGNOSIS — Z79899 Other long term (current) drug therapy: Secondary | ICD-10-CM | POA: Insufficient documentation

## 2017-11-19 DIAGNOSIS — O9989 Other specified diseases and conditions complicating pregnancy, childbirth and the puerperium: Secondary | ICD-10-CM | POA: Diagnosis not present

## 2017-11-19 DIAGNOSIS — O99512 Diseases of the respiratory system complicating pregnancy, second trimester: Secondary | ICD-10-CM | POA: Diagnosis not present

## 2017-11-19 DIAGNOSIS — L509 Urticaria, unspecified: Secondary | ICD-10-CM

## 2017-11-19 DIAGNOSIS — Z7951 Long term (current) use of inhaled steroids: Secondary | ICD-10-CM | POA: Diagnosis not present

## 2017-11-19 DIAGNOSIS — T783XXA Angioneurotic edema, initial encounter: Secondary | ICD-10-CM | POA: Insufficient documentation

## 2017-11-19 DIAGNOSIS — O99842 Bariatric surgery status complicating pregnancy, second trimester: Secondary | ICD-10-CM | POA: Diagnosis not present

## 2017-11-19 MED ORDER — LACTATED RINGERS IV BOLUS
1000.0000 mL | Freq: Once | INTRAVENOUS | Status: AC
  Administered 2017-11-19: 1000 mL via INTRAVENOUS

## 2017-11-19 MED ORDER — DIPHENHYDRAMINE HCL 25 MG PO CAPS
25.0000 mg | ORAL_CAPSULE | Freq: Once | ORAL | Status: AC
  Administered 2017-11-20: 25 mg via ORAL
  Filled 2017-11-19: qty 1

## 2017-11-19 MED ORDER — DEXAMETHASONE SODIUM PHOSPHATE 10 MG/ML IJ SOLN
10.0000 mg | Freq: Once | INTRAMUSCULAR | Status: AC
Start: 2017-11-19 — End: 2017-11-19
  Administered 2017-11-19: 10 mg via INTRAVENOUS
  Filled 2017-11-19: qty 1

## 2017-11-19 MED ORDER — DIPHENHYDRAMINE HCL 25 MG PO CAPS
25.0000 mg | ORAL_CAPSULE | Freq: Once | ORAL | Status: AC
  Administered 2017-11-19: 25 mg via ORAL
  Filled 2017-11-19: qty 1

## 2017-11-19 NOTE — MAU Note (Signed)
Pt presents to MAU c/o an allergic reaction to a unknown substance. Pt states her reaction started around 2315 pt is unsure of what the reaction is to but was prescribed an eip pen due to a similar reaction. Pt gave herself a does of Epi at 2243. Provider at bedside 2243.

## 2017-11-19 NOTE — MAU Provider Note (Addendum)
Chief Complaint:  No chief complaint on file.   First Provider Initiated Contact with Patient 11/19/17 2254     HPI: Zoe Soto is a 37 y.o. at 109w2d who presents to maternity admissions reporting sudden onset of hives and severe itching all over body, starting about a half hour ago.  Did not call her doctor.  Came here because she didn't know if it was safe to use her EpiPen.  Has had one similar episode in past requiring EpiPen.  Was eating out, not sure what she reacted to.  Ate same thing for dinner as lunch today.  Thinks might have been an insect bite, as she felt sharp stings on leg.  . She denies LOF, vaginal bleeding, vaginal itching/burning, urinary symptoms, h/a, dizziness, n/v, diarrhea, constipation or fever/chills.   Allergic Reaction  This is a new problem. The current episode started today. The problem occurs constantly. The problem has been gradually worsening since onset. It is unknown what she was exposed to. The time of exposure is unknown. The exposure occurred at home. Associated symptoms include hyperventilation, itching and a rash. Pertinent negatives include no abdominal pain, chest pressure, coughing, diarrhea, difficulty breathing, stridor, trouble swallowing, vomiting or wheezing. Swelling is present on the lips and eyes. Past treatments include nothing (but brought EpiPen with her). Her past medical history is significant for asthma and seasonal allergies (beestings also).    RN Note: Pt presents to MAU c/o an allergic reaction to a unknown substance. Pt states her reaction started around 2315 pt is unsure of what the reaction is to but was prescribed an eip pen due to a similar reaction. Pt gave herself a does of Epi at 2243. Provider at bedside 2243.     Past Medical History: Past Medical History:  Diagnosis Date  . Allergy    Seasonal  . Asthma   . Bee sting allergy   . History of chickenpox     Past obstetric history: OB History  No data available     Past Surgical History: Past Surgical History:  Procedure Laterality Date  . ANKLE SURGERY Right 2011  . GASTRIC BYPASS  07/2015  . KNEE ARTHROSCOPY Left 2010    Family History: Family History  Problem Relation Age of Onset  . Alcohol abuse Mother   . Heart attack Father 83  . Asthma Brother   . Diabetes Brother     Social History: Social History   Tobacco Use  . Smoking status: Never Smoker  . Smokeless tobacco: Never Used  Substance Use Topics  . Alcohol use: Yes    Comment: Maybe 1 drink per week.  . Drug use: No    Allergies:  Allergies  Allergen Reactions  . Naproxen Other (See Comments)    Hematuria when taken in large doses  . Tape Rash    Meds:  Medications Prior to Admission  Medication Sig Dispense Refill Last Dose  . albuterol (PROVENTIL HFA;VENTOLIN HFA) 108 (90 Base) MCG/ACT inhaler Inhale 2 puffs into the lungs as needed.   Taking  . azelastine (ASTELIN) 0.1 % nasal spray Place 2 sprays into both nostrils 2 (two) times daily. Use in each nostril as directed 30 mL 12 Taking  . benzonatate (TESSALON) 100 MG capsule Take 1 capsule (100 mg total) by mouth 2 (two) times daily as needed for cough. 20 capsule 0   . Biotin 1 MG CAPS Take 2 capsules by mouth daily.   Taking  . budesonide-formoterol (SYMBICORT) 160-4.5 MCG/ACT inhaler Inhale  2 puffs into the lungs as needed.    Taking  . Calcium Carbonate-Vitamin D (CALCIUM 600+D) 600-200 MG-UNIT TABS Take 1 tablet by mouth daily.   Taking  . cyanocobalamin (V-R VITAMIN B-12) 500 MCG tablet Take 1 tablet by mouth daily.   Taking  . EPINEPHrine 0.3 mg/0.3 mL IJ SOAJ injection Inject 0.3 mg by intramuscular injection one time only.   Taking  . ferrous sulfate 325 (65 FE) MG tablet Take 1 tablet by mouth daily.   Taking  . fexofenadine (ALLEGRA) 180 MG tablet TAKE 2 TABLETS BY MOUTH TWICE DAILY 120 tablet 0 Taking  . fluticasone (FLONASE) 50 MCG/ACT nasal spray SPRAY 2 SPRAYS INTO EACH NOSTRIL EVERY DAY 16 g 11    . Multiple Vitamin (MULTI-VITAMINS) TABS Take 1 Tab by mouth daily.   Taking  . NUVARING 0.12-0.015 MG/24HR vaginal ring INSERT 1 RING VAGINALLY FOR 3 WEEKS, THEN REMOVE FOR 1 WEEK, THEN REPEAT  11 Taking  . olopatadine (PATANOL) 0.1 % ophthalmic solution Place 1 drop into both eyes 2 (two) times daily. 5 mL 1 Taking    I have reviewed patient's Past Medical Hx, Surgical Hx, Family Hx, Social Hx, medications and allergies.   ROS:  Review of Systems  HENT: Negative for trouble swallowing.   Respiratory: Negative for cough, wheezing and stridor.   Gastrointestinal: Negative for abdominal pain, diarrhea and vomiting.  Skin: Positive for itching and rash.   Other systems negative  Physical Exam   Vitals:   11/19/17 2257 11/20/17 0136  BP:  (!) 92/51  Pulse: 88   Resp: 20   Temp: 97.9 F (36.6 C)   TempSrc: Oral      Constitutional: Well-developed, well-nourished female in no acute distress.  Cardiovascular: normal rate and rhythm Respiratory: normal effort, clear to auscultation bilaterally  No wheezing or tightness.  Oxygen saturation remained normal throughout.  GI: Abd soft, non-tender, gravid appropriate for gestational age.   No rebound or guarding. MS: Extremities nontender, no edema, normal ROM Neurologic: Alert and oriented x 4.  GU: Neg CVAT. Skin:   Scattered erethema throughout. .  Some hives noted.    PELVIC EXAM:  deferred  FHT:   150s by bedside US because RN unable to get FHR with doppler             Fetus active, norma appearance  Labs: No results found for this or any previous visit (from the past 24 hour(s)).  Imaging:  No results found.  MAU Course/MDM: Consult Dr Corinna Capra with presentation, exam findings and test results.  Treatments in MAU included EpiPen.  We also gave her a dose of Benadryl for continued itching which patient described as severe.  She did get relief from this.  Over time, there erethema and itching improved.     Assessment: Single intrauterine pregnancy at [redacted]w[redacted]d Allergic reaction to unknown substance Full body hives - Plan: Discharge patient  Allergic angioedema, initial encounter - Plan: Discharge patient    Plan: Discharge home Benadryl PRN at home Supportive card  Follow up in Office for prenatal visits , needs to call in AM to get referral to allergy specialist  Encouraged to return here or to other Urgent Care/ED if she develops worsening of symptoms, increase in pain, fever, or other concerning symptoms.   Pt stable at time of discharge.  Hansel Feinstein CNM, MSN Certified Nurse-Midwife 11/19/2017 10:55 PM

## 2017-11-20 MED ORDER — EPINEPHRINE 0.3 MG/0.3ML IJ SOAJ: 0.3000 mg | INTRAMUSCULAR | 1 refills | Status: DC | PRN

## 2017-11-20 NOTE — Discharge Instructions (Signed)
Angioedema Angioedema is the sudden swelling of tissue in the body. Angioedema can affect any part of the body, but it most often affects the deeper parts of the skin, causing red, itchy patches (hives) to appear over the affected area. It often begins during the night and is found in the morning. Depending on the cause, angioedema may happen:  Only once.  Several times. It may come back in unpredictable patterns.  Repeatedly for several years. Over time, it may gradually stop coming back.  Angioedema can be life-threatening if it affects the air passages that you breathe through. What are the causes? This condition may be caused by:  Foods, such as milk, eggs, shellfish, wheat, or nuts.  Certain medicines, such as ACE inhibitors, antibiotics, nonsteroidal anti-inflammatory drugs, birth control pills, or dyes used in X-rays.  Insect stings.  Infections.  Angioedema can be inherited, and episodes can be triggered by:  Mild injury.  Dental work.  Surgery.  Stress.  Sudden changes in temperature.  Exercise.  In some cases, the cause of this condition is not known. What are the signs or symptoms? Symptoms of this condition depend on where the swelling happens. Symptoms may include:  Swollen skin.  Red, itchy patches of skin (hives).  Redness in the affected area.  Pain in the affected area.  Swollen lips or tongue.  Wheezing.  Breathing problems.  If your internal organs are involved, symptoms may also include:  Nausea.  Abdominal pain.  Vomiting.  Difficulty swallowing.  Difficulty passing urine.  How is this diagnosed? This condition may be diagnosed based on:  An exam of the affected area.  Your medical history.  Whether anyone in your family has had this condition before.  A review of any medicines you have been taking.  Tests, including: ? Allergy skin tests to see if the condition was caused by an allergic reaction. ? Blood tests to see  if the condition was caused by a gene. ? Tests to check for underlying diseases that could cause the condition.  How is this treated? Treatment for this condition depends on the cause. It may involve any of the following:  If something triggered the condition, making changes to keep it from triggering the condition again.  If the condition affects your breathing, having tubes placed in your airway to keep it open.  Taking medicines to treat symptoms or prevent future episodes. These may include: ? Antihistamines. ? Epinephrine injections. ? Steroids.  If your condition is severe, you may need to be treated at the hospital. Angioedema usually gets better in 24-48 hours. Follow these instructions at home:  Take over-the-counter and prescription medicines only as told by your health care provider.  If you were given medicines for emergency allergy treatment, always carry them with you.  Wear a medical bracelet as told by your health care provider.  If something triggers your condition, avoid the trigger, if possible.  If your condition is inherited and you are thinking about having children, talk to your health care provider. It is important to discuss the risks of passing on the condition to your children. Contact a health care provider if:  You have repeated episodes of angioedema.  Episodes of angioedema start to happen more often than they used to, even after you take steps to prevent them.  You have episodes of angioedema that are more severe than they have been before, even after you take steps to prevent them.  You are thinking about having children. Get  help right away if:  You have severe swelling of your mouth, tongue, or lips.  You have trouble breathing.  You have trouble swallowing. You faint.  Allergies, Adult An allergy is when your body's defense system (immune system) overreacts to an otherwise harmless substance (allergen) that you breathe in or eat or  something that touches your skin. When you come into contact with something that you are allergic to, your immune system produces certain proteins (antibodies). These proteins cause cells to release chemicals (histamines) that trigger the symptoms of an allergic reaction. Allergies often affect the nasal passages (allergic rhinitis), eyes (allergic conjunctivitis), skin (atopic dermatitis), and stomach. Allergies can be mild or severe. Allergies cannot spread from person to person (are not contagious). They can develop at any age and may be outgrown. What increases the risk? You may be at greater risk of allergies if other people in your family have allergies. What are the signs or symptoms? Symptoms depend on what type of allergy you have. They may include:  Runny, stuffy nose.  Sneezing.  Itchy mouth, ears, or throat.  Postnasal drip.  Sore throat.  Itchy, red, watery, or puffy eyes.  Skin rash or hives.  Stomach pain.  Vomiting.  Diarrhea.  Bloating.  Wheezing or coughing.  People with a severe allergy to food, medicine, or an insect bite may have a life-threatening allergic reaction (anaphylaxis). Symptoms of anaphylaxis include:  Hives.  Itching.  Flushed face.  Swollen lips, tongue, or mouth.  Tight or swollen throat.  Chest pain or tightness in the chest.  Trouble breathing or shortness of breath.  Rapid heartbeat.  Dizziness or fainting.  Vomiting.  Diarrhea.  Pain in the abdomen.  How is this diagnosed? This condition is diagnosed based on:  Your symptoms.  Your family and medical history.  A physical exam.  You may need to see a health care provider who specializes in treating allergies (allergist). You may also have tests, including:  Skin tests to see which allergens are causing your symptoms, such as: ? Skin prick test. In this test, your skin is pricked with a tiny needle and exposed to small amounts of possible allergens to see if  your skin reacts. ? Intradermal skin test. In this test, a small amount of allergen is injected under your skin to see if your skin reacts. ? Patch test. In this test, a small amount of allergen is placed on your skin and then your skin is covered with a bandage. Your health care provider will check your skin after a couple of days to see if a rash has developed.  Blood tests.  Challenges tests. In this test, you inhale a small amount of allergen by mouth to see if you have an allergic reaction.  You may also be asked to:  Keep a food diary. A food diary is a record of all the foods and drinks you have in a day and any symptoms you experience.  Practice an elimination diet. An elimination diet involves eliminating specific foods from your diet and then adding them back in one by one to find out if a certain food causes an allergic reaction.  How is this treated? Treatment for allergies depends on your symptoms. Treatment may include:  Cold compresses to soothe itching and swelling.  Eye drops.  Nasal sprays.  Using a saline spray or container (neti pot) to flush out the nose (nasal irrigation). These methods can help clear away mucus and keep the nasal passages  moist.  Using a humidifier.  Oral antihistamines or other medicines to block allergic reaction and inflammation.  Skin creams to treat rashes or itching.  Diet changes to eliminate food allergy triggers.  Repeated exposure to tiny amounts of allergens to build up a tolerance and prevent future allergic reactions (immunotherapy). These include: ? Allergy shots. ? Oral treatment. This involves taking small doses of an allergen under the tongue (sublingual immunotherapy).  Emergency epinephrine injection (auto-injector) in case of an allergic emergency. This is a self-injectable, pre-measured medicine that must be given within the first few minutes of a serious allergic reaction.  Follow these instructions at home:  Avoid  known allergens whenever possible.  If you suffer from airborne allergens, wash out your nose daily. You can do this with a saline spray or a neti pot to flush out your nose (nasal irrigation).  Take over-the-counter and prescription medicines only as told by your health care provider.  Keep all follow-up visits as told by your health care provider. This is important.  If you are at risk of a severe allergic reaction (anaphylaxis), keep your auto-injector with you at all times.  If you have ever had anaphylaxis, wear a medical alert bracelet or necklace that states you have a severe allergy. Contact a health care provider if:  Your symptoms do not improve with treatment. Get help right away if:  You have symptoms of anaphylaxis, such as: ? Swollen mouth, tongue, or throat. ? Pain or tightness in your chest. ? Trouble breathing or shortness of breath. ? Dizziness or fainting. ? Severe abdominal pain, vomiting, or diarrhea. This information is not intended to replace advice given to you by your health care provider. Make sure you discuss any questions you have with your health care provider. Document Released: 07/17/2002 Document Revised: 08/22/2016 Document Reviewed: 11/09/2015 Elsevier Interactive Patient Education  2018 [employer].  Epinephrine Injection What is epinephrine? Epinephrine is a medicine that is given as a shot (injection) to temporarily treat a life-threatening allergic reaction. It may also be used to treat severe asthma attacks, other lung problems, and other emergency conditions. Epinephrine works by relaxing the muscles in the airways and tightening the blood vessels. Epinephrine comes in many forms, including what is commonly called an auto-injector "pen" (pre-filled automatic epinephrine injection device). You may hear other names that mean the same thing, including epinephrine injection, epinephrine auto-injector pen, epinephrine pen, and automatic injection  device. Why do I need epinephrine? You need epinephrine if you experience a severe asthma attack or a life-threatening allergic reaction (anaphylaxis). This injection can be lifesaving. You should always carry an auto-injector pen with you if you are at risk for anaphylaxis. When should I use my auto-injector pen? Use your auto-injector pen as soon as you think you are experiencing anaphylaxis or a severe asthmatic attack, as told by your health care provider. Anaphylaxis is very dangerous if it is not treated right away. Signs and symptoms of anaphylaxis may include:  Nasal congestion.  Tingling in the mouth.  An itchy, red rash.  Swelling of the eyes, lips, face, or tongue.  Swelling of the back of the mouth and the throat.  Wheezing.  A hoarse voice.  Itchy, red, swollen areas of skin (hives).  Dizziness or light-headedness.  Fainting.  Anxiety or confusion.  Abdominal pain.  Difficulty breathing, speaking, or swallowing.  Chest tightness.  Fast or irregular heartbeats (palpitations).  Vomiting.  Diarrhea.  These symptoms may represent a serious problem that is an  emergency. Do not wait to see if the symptoms will go away. Use your auto-injector pen as you have been instructed, and get medical help right away. Call your local emergency services (911 in the [place] Do not drive yourself to the hospital. How do I use an auto-injector pen?  Use epinephrine exactly as told by your health care provider. Do not inject it more often or in greater or smaller doses than your health care provider prescribed. Most auto-injector pens contain one dose of epinephrine. Some contain two doses.  You may use your auto-injector pen to give an injection under your skin or into your muscle on the outer side of your thigh. Do not give yourself an injection into your buttocks or any other part of your body. ? In an emergency, you can use your auto-injector pen through your  clothing. ? After you inject a dose of epinephrine, some liquid may remain in your auto-injector pen. This is normal.  If you need to give yourself a second dose of epinephrine, give the second injection in another location on your outer thigh. Do not give two injections in exactly the same place on your body. This can lead to tissue damage.  Talk with your pharmacist or health care provider if you have questions about how to inject epinephrine correctly. When should I seek immediate medical care? Seek emergency medical treatment immediately after you inject epinephrine. You may need additional medical care, and you may be monitored for side effects of epinephrine, such as:  Difficulty breathing.  Fast or irregular heartbeat.  Nausea or vomiting.  Sweating.  Dizziness.  Nervousness or anxiety.  Weakness.  Pale skin.  Headache.  Uncontrollable shaking.  This information is not intended to replace advice given to you by your health care provider. Make sure you discuss any questions you have with your health care provider. Document Released: 04/20/2000 Document Revised: 04/06/2016 Document Reviewed: 11/10/2014 Elsevier Interactive Patient Education  2017 [employer].   This information is not intended to replace advice given to you by your health care provider. Make sure you discuss any questions you have with your health care provider. Document Released: 07/02/2001 Document Revised: 11/19/2015 Document Reviewed: 11/01/2015 Elsevier Interactive Patient Education  [employer].

## 2017-11-21 ENCOUNTER — Other Ambulatory Visit: Payer: Self-pay

## 2017-11-21 ENCOUNTER — Encounter: Payer: Self-pay | Admitting: Family Medicine

## 2017-11-21 ENCOUNTER — Ambulatory Visit: Payer: Managed Care, Other (non HMO) | Admitting: Family Medicine

## 2017-11-21 VITALS — BP 124/81 | HR 74 | Temp 98.0°F | Resp 16 | Ht 66.0 in | Wt 240.1 lb

## 2017-11-21 DIAGNOSIS — L03115 Cellulitis of right lower limb: Secondary | ICD-10-CM

## 2017-11-21 DIAGNOSIS — Z91038 Other insect allergy status: Secondary | ICD-10-CM

## 2017-11-21 MED ORDER — CEPHALEXIN 500 MG PO CAPS: 500.0000 mg | ORAL_CAPSULE | Freq: Three times a day (TID) | ORAL | 0 refills | Status: AC

## 2017-11-21 NOTE — Progress Notes (Signed)
   Subjective:    Patient ID: Zoe Soto, female    DOB: March 02, 1981, 37 y.o.   MRN: [id number]  HPI Bug bite- pt went to ER on 7/16 for allergic rxn (whole body hives).  She was given EpiPen and Benadryl.  Pt w/ area of faint redness, swelling, and TTP over R lateral lower leg (site of bite)   Review of Systems For ROS see HPI     Objective:   Physical Exam  Constitutional: She appears well-developed and well-nourished. No distress.  Cardiovascular: Intact distal pulses.  Musculoskeletal: She exhibits edema (induration of R lateral lower leg and some induration of 2nd bite on anterior R lower leg) and tenderness (TTP over lateral and anterior R lower leg bite sites).  Skin: Skin is warm and dry. There is erythema (7 cm circle of redness, induration, and TTP over R lateral lower leg and 3.5 cm circle of redness, induration and TTP).  Psychiatric: She has a normal mood and affect. Her behavior is normal. Thought content normal.  Vitals reviewed.         Assessment & Plan:  Allergic rxn- new to provider, recurrent for pt.  She has required use of EpiPen previously and required this again 2 days ago when she developed head to toe hives, lip swelling, and oral hives after an ant bite.  She previously had an allergist and is asking for a local referral.  Referral placed  Cellulitis- new. Appears pt has mild cellulitis forming around the 2 bite sites on her R lower leg.  Start Keflex (safe in pregnancy).  Encouraged daily antihistamine (claritin or Zyrtec but not allegra) and to take Benadryl as needed for itching.  Reviewed supportive care and red flags that should prompt return.  Pt expressed understanding and is in agreement w/ plan.

## 2017-11-21 NOTE — Patient Instructions (Signed)
Follow up as needed or as scheduled START the Cephalexin 3x/day w/ food Apply warm or cool compresses (whichever feels better) Benadryl for nighttime itching Claritin (Loratidine) or Zyrtec (Cetirizine) for daytime itching- don't use Fexofenadine Call with any questions or concerns Hang in there!!!

## 2018-02-21 DIAGNOSIS — B009 Herpesviral infection, unspecified: Secondary | ICD-10-CM | POA: Insufficient documentation

## 2018-05-06 MED ORDER — CARBOPROST TROMETHAMINE 250 MCG/ML IM SOLN
250.00 | INTRAMUSCULAR | Status: DC
Start: ? — End: 2018-05-06

## 2018-05-06 MED ORDER — MISOPROSTOL 200 MCG PO TABS
800.00 | ORAL_TABLET | ORAL | Status: DC
Start: ? — End: 2018-05-06

## 2018-05-06 MED ORDER — DOCUSATE SODIUM 100 MG PO CAPS
100.00 | ORAL_CAPSULE | ORAL | Status: DC
Start: 2018-05-06 — End: 2018-05-06

## 2018-05-06 MED ORDER — OXYTOCIN-SODIUM CHLORIDE 20-0.9 UT/500ML-% IV SOLN
10.00 | INTRAVENOUS | Status: DC
Start: ? — End: 2018-05-06

## 2018-05-06 MED ORDER — PNV PRENATAL PLUS MULTIVITAMIN 27-1 MG PO TABS
1.00 | ORAL_TABLET | ORAL | Status: DC
Start: 2018-05-07 — End: 2018-05-06

## 2018-05-06 MED ORDER — METHYLERGONOVINE MALEATE 0.2 MG/ML IJ SOLN
0.20 | INTRAMUSCULAR | Status: DC
Start: ? — End: 2018-05-06

## 2018-05-06 MED ORDER — BENZOCAINE 20 % EX AERO
1.00 | INHALATION_SPRAY | CUTANEOUS | Status: DC
Start: ? — End: 2018-05-06

## 2018-05-06 MED ORDER — WITCH HAZEL-GLYCERIN EX PADS
1.00 | MEDICATED_PAD | CUTANEOUS | Status: DC
Start: ? — End: 2018-05-06

## 2018-05-06 MED ORDER — ACETAMINOPHEN 325 MG PO TABS
650.00 | ORAL_TABLET | ORAL | Status: DC
Start: 2018-05-06 — End: 2018-05-06

## 2018-05-06 MED ORDER — ONDANSETRON HCL 4 MG/2ML IJ SOLN
4.00 | INTRAMUSCULAR | Status: DC
Start: ? — End: 2018-05-06

## 2018-05-06 MED ORDER — LIDOCAINE 5 % EX PTCH
1.00 | MEDICATED_PATCH | CUTANEOUS | Status: DC
Start: 2018-05-07 — End: 2018-05-06

## 2018-05-06 MED ORDER — DIPHENOXYLATE-ATROPINE 2.5-0.025 MG PO TABS
2.00 | ORAL_TABLET | ORAL | Status: DC
Start: ? — End: 2018-05-06

## 2018-05-06 MED ORDER — SIMETHICONE 80 MG PO CHEW
80.00 | CHEWABLE_TABLET | ORAL | Status: DC
Start: ? — End: 2018-05-06

## 2018-05-06 MED ORDER — ALUM & MAG HYDROXIDE-SIMETH 400-400-40 MG/5ML PO SUSP
15.00 | ORAL | Status: DC
Start: ? — End: 2018-05-06

## 2018-06-04 DIAGNOSIS — D649 Anemia, unspecified: Secondary | ICD-10-CM | POA: Insufficient documentation

## 2018-06-04 DIAGNOSIS — D509 Iron deficiency anemia, unspecified: Secondary | ICD-10-CM | POA: Insufficient documentation

## 2018-06-19 NOTE — H&P (Addendum)
Zoe Soto is an 38 y.o. female. Presenting for scheduled [facility] BTL with [place] D&C and Novasure endometrial ablation.  Pertinent Gynecological History: Menses: flow is moderate Bleeding: dysfunctional uterine bleeding outside of nuvaring use Contraception: NuvaRing vaginal inserts DES exposure: denies Blood transfusions: none Sexually transmitted diseases: HSV Previous GYN Procedures: None  Last mammogram: n/a  Last pap: normal Date: 2019 OB History: G3, P2012    Past Medical History:  Diagnosis Date  . Allergy    Seasonal  . Asthma   . Bee sting allergy   . History of chickenpox     Past Surgical History:  Procedure Laterality Date  . ANKLE SURGERY Right 2011  . GASTRIC BYPASS  07/2015  . KNEE ARTHROSCOPY Left 2010    Family History  Problem Relation Age of Onset  . Alcohol abuse Mother   . Heart attack Father 33  . Asthma Brother   . Diabetes Brother     Social History:  reports that she has never smoked. She has never used smokeless tobacco. She reports previous alcohol use. She reports that she does not use drugs.   Allergies:  Allergies  Allergen Reactions  . Naproxen Other (See Comments)    Hematuria when taken in large doses  . Tape Rash    No medications prior to admission.    ROS  Last menstrual period 08/21/2017. Physical Exam  Gen: well appearing, NAD CV: Reg rate Pulm: NWOB Abd: soft, nondistended, nontender, no masses. Obese. Prior [place] by passs scars well healed GYN: uterus 10 week size, no adnexa ttp/CMT Ext: No edema b/l   No results found for this or any previous visit (from the past 24 hour(s)).  No results found.  TVUS w/9cm uterus  Assessment/Plan: 38 yo [id number] presenting for scheduled surgical sterilization, [place] with D&C, and endometrial ablation. She has heavy, regularly timed menstrual cycles.  Risks discussed including infection, bleeding, damage to surrounding structures, need for additional procedures, postoperative DVT,  failure, regret, ectopic pregnancy, future endometrial cancer, unexpected pathology and postablation tubal syndrome. All questions answered. Consent signed in the office.    Tyson Dense 06/19/2018, 9:52 AM    No updates to above H&P. Patient arrived NPO and was consented in PACU. Risks again discussed, all questions answered, and consent signed. Proceed with above surgery.    Lucillie Garfinkel MD

## 2018-06-23 ENCOUNTER — Other Ambulatory Visit: Payer: Self-pay

## 2018-06-23 ENCOUNTER — Encounter (HOSPITAL_BASED_OUTPATIENT_CLINIC_OR_DEPARTMENT_OTHER): Payer: Self-pay | Admitting: *Deleted

## 2018-06-25 NOTE — Progress Notes (Signed)
Spoke with patient by phone all instructions left on voicemail understood, cannot come for pre op due to at [place] with sick son

## 2018-06-25 NOTE — Progress Notes (Signed)
Called patient back 06-24-2018 for her to write instructions down x 2, voicemail only Called patient back 2-19 2020 and left instructions on voicemail and asked patient to call me back and confirm instructions received Spoke with Zoe Soto on 06-23-2018 to do medical history Npo after midnight food, clear liquids from midnight until 430 am then npo No meds to take Spouse jeffrey driver Needs type and screen and urine pregnancy Has surgery orders in epic

## 2018-06-27 ENCOUNTER — Encounter (HOSPITAL_BASED_OUTPATIENT_CLINIC_OR_DEPARTMENT_OTHER): Payer: Self-pay | Admitting: *Deleted

## 2018-06-27 ENCOUNTER — Ambulatory Visit (HOSPITAL_BASED_OUTPATIENT_CLINIC_OR_DEPARTMENT_OTHER): Payer: Managed Care, Other (non HMO) | Admitting: Anesthesiology

## 2018-06-27 ENCOUNTER — Observation Stay (HOSPITAL_BASED_OUTPATIENT_CLINIC_OR_DEPARTMENT_OTHER)
Admission: RE | Admit: 2018-06-27 | Discharge: 2018-06-28 | Disposition: A | Discharge status: Home / Self Care | Payer: Managed Care, Other (non HMO) | Attending: Obstetrics and Gynecology | Admitting: Obstetrics and Gynecology

## 2018-06-27 ENCOUNTER — Other Ambulatory Visit: Payer: Self-pay

## 2018-06-27 ENCOUNTER — Encounter (HOSPITAL_BASED_OUTPATIENT_CLINIC_OR_DEPARTMENT_OTHER)
Admission: RE | Disposition: A | Discharge status: Home / Self Care | Payer: Self-pay | Source: Home / Self Care | Attending: Obstetrics and Gynecology

## 2018-06-27 DIAGNOSIS — Z9884 Bariatric surgery status: Secondary | ICD-10-CM | POA: Diagnosis not present

## 2018-06-27 DIAGNOSIS — Z886 Allergy status to analgesic agent status: Secondary | ICD-10-CM | POA: Insufficient documentation

## 2018-06-27 DIAGNOSIS — Z9889 Other specified postprocedural states: Secondary | ICD-10-CM

## 2018-06-27 DIAGNOSIS — Z302 Encounter for sterilization: Principal | ICD-10-CM | POA: Insufficient documentation

## 2018-06-27 DIAGNOSIS — N939 Abnormal uterine and vaginal bleeding, unspecified: Secondary | ICD-10-CM | POA: Insufficient documentation

## 2018-06-27 DIAGNOSIS — J45909 Unspecified asthma, uncomplicated: Secondary | ICD-10-CM | POA: Diagnosis not present

## 2018-06-27 DIAGNOSIS — Y92530 Ambulatory surgery center as the place of occurrence of the external cause: Secondary | ICD-10-CM | POA: Insufficient documentation

## 2018-06-27 DIAGNOSIS — Y763 Surgical instruments, materials and obstetric and gynecological devices (including sutures) associated with adverse incidents: Secondary | ICD-10-CM | POA: Diagnosis not present

## 2018-06-27 DIAGNOSIS — T282XXA Burn of other parts of alimentary tract, initial encounter: Secondary | ICD-10-CM | POA: Insufficient documentation

## 2018-06-27 HISTORY — DX: Pneumonia, unspecified organism: J18.9

## 2018-06-27 HISTORY — PX: LAPAROSCOPIC TUBAL LIGATION: SHX1937

## 2018-06-27 HISTORY — PX: DILITATION & CURRETTAGE/HYSTROSCOPY WITH NOVASURE ABLATION: SHX5568

## 2018-06-27 HISTORY — PX: SMALL BOWEL REPAIR: SHX6447

## 2018-06-27 LAB — TYPE AND SCREEN
ABO/RH(D): B POS
Antibody Screen: NEGATIVE

## 2018-06-27 LAB — POCT PREGNANCY, URINE: Preg Test, Ur: NEGATIVE

## 2018-06-27 LAB — ABO/RH: ABO/RH(D): B POS

## 2018-06-27 SURGERY — LIGATION, FALLOPIAN TUBE, LAPAROSCOPIC
Anesthesia: General

## 2018-06-27 MED ORDER — ONDANSETRON HCL 4 MG/2ML IJ SOLN
INTRAMUSCULAR | Status: AC
  Filled 2018-06-27: qty 2

## 2018-06-27 MED ORDER — ESMOLOL HCL 100 MG/10ML IV SOLN
INTRAVENOUS | Status: DC | PRN
  Administered 2018-06-27: 20 mg via INTRAVENOUS

## 2018-06-27 MED ORDER — CEFAZOLIN SODIUM 1 G IJ SOLR
INTRAMUSCULAR | Status: AC
  Filled 2018-06-27: qty 20

## 2018-06-27 MED ORDER — ACETAMINOPHEN 500 MG PO TABS
ORAL_TABLET | ORAL | Status: AC
  Filled 2018-06-27: qty 2

## 2018-06-27 MED ORDER — ACETAMINOPHEN 500 MG PO TABS
1000.0000 mg | ORAL_TABLET | Freq: Once | ORAL | Status: AC
  Administered 2018-06-27: 1000 mg via ORAL
  Filled 2018-06-27: qty 2

## 2018-06-27 MED ORDER — HYDROMORPHONE HCL 1 MG/ML IJ SOLN
INTRAMUSCULAR | Status: AC
  Filled 2018-06-27: qty 1

## 2018-06-27 MED ORDER — OXYCODONE HCL 5 MG PO TABS
5.0000 mg | ORAL_TABLET | ORAL | Status: DC | PRN
  Filled 2018-06-27: qty 2

## 2018-06-27 MED ORDER — DEXAMETHASONE SODIUM PHOSPHATE 10 MG/ML IJ SOLN
INTRAMUSCULAR | Status: DC | PRN
  Administered 2018-06-27: 10 mg via INTRAVENOUS

## 2018-06-27 MED ORDER — FENTANYL CITRATE (PF) 100 MCG/2ML IJ SOLN
INTRAMUSCULAR | Status: DC | PRN
  Administered 2018-06-27 (×5): 50 ug via INTRAVENOUS

## 2018-06-27 MED ORDER — FENTANYL CITRATE (PF) 100 MCG/2ML IJ SOLN
INTRAMUSCULAR | Status: AC
  Filled 2018-06-27: qty 2

## 2018-06-27 MED ORDER — SCOPOLAMINE 1 MG/3DAYS TD PT72
MEDICATED_PATCH | TRANSDERMAL | Status: AC
  Filled 2018-06-27: qty 1

## 2018-06-27 MED ORDER — DEXAMETHASONE SODIUM PHOSPHATE 10 MG/ML IJ SOLN
INTRAMUSCULAR | Status: AC
  Filled 2018-06-27: qty 1

## 2018-06-27 MED ORDER — ACETAMINOPHEN 500 MG PO TABS
1000.0000 mg | ORAL_TABLET | Freq: Four times a day (QID) | ORAL | Status: DC
  Administered 2018-06-27 – 2018-06-28 (×4): 1000 mg via ORAL
  Filled 2018-06-27: qty 2

## 2018-06-27 MED ORDER — DOCUSATE SODIUM 100 MG PO CAPS
100.0000 mg | ORAL_CAPSULE | Freq: Two times a day (BID) | ORAL | Status: DC
  Administered 2018-06-27 (×2): 100 mg via ORAL
  Filled 2018-06-27: qty 1

## 2018-06-27 MED ORDER — LIDOCAINE 2% (20 MG/ML) 5 ML SYRINGE
INTRAMUSCULAR | Status: AC
  Filled 2018-06-27: qty 5

## 2018-06-27 MED ORDER — OXYCODONE HCL 5 MG PO TABS
ORAL_TABLET | ORAL | Status: AC
  Filled 2018-06-27: qty 1

## 2018-06-27 MED ORDER — OXYCODONE HCL 5 MG/5ML PO SOLN
5.0000 mg | Freq: Once | ORAL | Status: DC | PRN
  Filled 2018-06-27: qty 5

## 2018-06-27 MED ORDER — DOCUSATE SODIUM 100 MG PO CAPS
ORAL_CAPSULE | ORAL | Status: AC
  Filled 2018-06-27: qty 1

## 2018-06-27 MED ORDER — SUGAMMADEX SODIUM 200 MG/2ML IV SOLN
INTRAVENOUS | Status: AC
  Filled 2018-06-27: qty 2

## 2018-06-27 MED ORDER — ROCURONIUM BROMIDE 100 MG/10ML IV SOLN
INTRAVENOUS | Status: AC
  Filled 2018-06-27: qty 1

## 2018-06-27 MED ORDER — ROCURONIUM BROMIDE 10 MG/ML (PF) SYRINGE
PREFILLED_SYRINGE | INTRAVENOUS | Status: DC | PRN
  Administered 2018-06-27: 40 mg via INTRAVENOUS
  Administered 2018-06-27 (×2): 10 mg via INTRAVENOUS

## 2018-06-27 MED ORDER — ALBUTEROL SULFATE HFA 108 (90 BASE) MCG/ACT IN AERS
2.0000 | INHALATION_SPRAY | Freq: Four times a day (QID) | RESPIRATORY_TRACT | Status: DC | PRN
  Filled 2018-06-27: qty 6.7

## 2018-06-27 MED ORDER — ONDANSETRON HCL 4 MG PO TABS
4.0000 mg | ORAL_TABLET | Freq: Four times a day (QID) | ORAL | Status: DC | PRN
  Filled 2018-06-27: qty 1

## 2018-06-27 MED ORDER — PROMETHAZINE HCL 25 MG/ML IJ SOLN
6.2500 mg | INTRAMUSCULAR | Status: DC | PRN
  Filled 2018-06-27: qty 1

## 2018-06-27 MED ORDER — LACTATED RINGERS IV SOLN
INTRAVENOUS | Status: DC
  Administered 2018-06-27 (×3): via INTRAVENOUS
  Filled 2018-06-27: qty 1000

## 2018-06-27 MED ORDER — PROPOFOL 10 MG/ML IV BOLUS
INTRAVENOUS | Status: AC
  Filled 2018-06-27: qty 40

## 2018-06-27 MED ORDER — LIDOCAINE 2% (20 MG/ML) 5 ML SYRINGE
INTRAMUSCULAR | Status: DC | PRN
  Administered 2018-06-27: 60 mg via INTRAVENOUS
  Administered 2018-06-27: 40 mg via INTRAVENOUS

## 2018-06-27 MED ORDER — ARTIFICIAL TEARS OPHTHALMIC OINT
TOPICAL_OINTMENT | OPHTHALMIC | Status: AC
  Filled 2018-06-27: qty 3.5

## 2018-06-27 MED ORDER — PROPOFOL 10 MG/ML IV BOLUS
INTRAVENOUS | Status: DC | PRN
  Administered 2018-06-27: 150 mg via INTRAVENOUS

## 2018-06-27 MED ORDER — HYDROMORPHONE HCL 1 MG/ML IJ SOLN
0.2000 mg | INTRAMUSCULAR | Status: DC | PRN
  Administered 2018-06-27: 0.5 mg via INTRAVENOUS
  Administered 2018-06-27: 0.6 mg via INTRAVENOUS
  Filled 2018-06-27: qty 1

## 2018-06-27 MED ORDER — ONDANSETRON HCL 4 MG/2ML IJ SOLN
4.0000 mg | Freq: Four times a day (QID) | INTRAMUSCULAR | Status: DC | PRN
  Filled 2018-06-27: qty 2

## 2018-06-27 MED ORDER — SODIUM CHLORIDE (PF) 0.9 % IJ SOLN
INTRAMUSCULAR | Status: AC
  Filled 2018-06-27: qty 10

## 2018-06-27 MED ORDER — HYDROMORPHONE HCL 1 MG/ML IJ SOLN
0.2500 mg | INTRAMUSCULAR | Status: DC | PRN
  Administered 2018-06-27 (×2): 0.25 mg via INTRAVENOUS
  Filled 2018-06-27: qty 0.5

## 2018-06-27 MED ORDER — ONDANSETRON HCL 4 MG/2ML IJ SOLN
INTRAMUSCULAR | Status: DC | PRN
  Administered 2018-06-27 (×2): 4 mg via INTRAVENOUS

## 2018-06-27 MED ORDER — SCOPOLAMINE 1 MG/3DAYS TD PT72
1.0000 | MEDICATED_PATCH | TRANSDERMAL | Status: DC
  Administered 2018-06-27: 1.5 mg via TRANSDERMAL
  Filled 2018-06-27: qty 1

## 2018-06-27 MED ORDER — FAMOTIDINE 20 MG PO TABS
ORAL_TABLET | ORAL | Status: AC
  Filled 2018-06-27: qty 1

## 2018-06-27 MED ORDER — KETOROLAC TROMETHAMINE 30 MG/ML IJ SOLN
30.0000 mg | Freq: Once | INTRAMUSCULAR | Status: DC | PRN
  Filled 2018-06-27: qty 1

## 2018-06-27 MED ORDER — LACTATED RINGERS IV SOLN
INTRAVENOUS | Status: DC
  Administered 2018-06-27 – 2018-06-28 (×3): via INTRAVENOUS
  Filled 2018-06-27 (×3): qty 1000

## 2018-06-27 MED ORDER — LIDOCAINE HCL 1 % IJ SOLN
INTRAMUSCULAR | Status: DC | PRN
  Administered 2018-06-27: 10 mL

## 2018-06-27 MED ORDER — OXYCODONE HCL 5 MG PO TABS
5.0000 mg | ORAL_TABLET | Freq: Once | ORAL | Status: DC | PRN
  Filled 2018-06-27: qty 1

## 2018-06-27 MED ORDER — CEFAZOLIN SODIUM-DEXTROSE 2-3 GM-%(50ML) IV SOLR
INTRAVENOUS | Status: DC | PRN
  Administered 2018-06-27: 2 g via INTRAVENOUS

## 2018-06-27 MED ORDER — ESMOLOL HCL 100 MG/10ML IV SOLN
INTRAVENOUS | Status: AC
  Filled 2018-06-27: qty 10

## 2018-06-27 MED ORDER — SUGAMMADEX SODIUM 200 MG/2ML IV SOLN
INTRAVENOUS | Status: DC | PRN
  Administered 2018-06-27: 400 mg via INTRAVENOUS

## 2018-06-27 MED ORDER — FAMOTIDINE 20 MG PO TABS
20.0000 mg | ORAL_TABLET | Freq: Once | ORAL | Status: AC
  Administered 2018-06-27: 20 mg via ORAL
  Filled 2018-06-27: qty 1

## 2018-06-27 MED ORDER — MIDAZOLAM HCL 2 MG/2ML IJ SOLN
INTRAMUSCULAR | Status: AC
  Filled 2018-06-27: qty 2

## 2018-06-27 MED ORDER — MIDAZOLAM HCL 2 MG/2ML IJ SOLN
INTRAMUSCULAR | Status: DC | PRN
  Administered 2018-06-27: 2 mg via INTRAVENOUS

## 2018-06-27 SURGICAL SUPPLY — 49 items
ABLATOR SURESOUND NOVASURE (ABLATOR) ×4 IMPLANT
CANISTER SUCT 3000ML PPV (MISCELLANEOUS) ×4 IMPLANT
CATH ROBINSON RED A/P 16FR (CATHETERS) ×4 IMPLANT
CLOTH BEACON ORANGE TIMEOUT ST (SAFETY) ×4 IMPLANT
COUNTER NEEDLE 1200 MAGNETIC (NEEDLE) ×4 IMPLANT
COVER MAYO STAND STRL (DRAPES) ×4 IMPLANT
COVER WAND RF STERILE (DRAPES) ×4 IMPLANT
DERMABOND ADVANCED (GAUZE/BANDAGES/DRESSINGS) ×2
DERMABOND ADVANCED .7 DNX12 (GAUZE/BANDAGES/DRESSINGS) ×2 IMPLANT
DILATOR CANAL MILEX (MISCELLANEOUS) IMPLANT
DRSG OPSITE POSTOP 3X4 (GAUZE/BANDAGES/DRESSINGS) ×4 IMPLANT
DURAPREP 26ML APPLICATOR (WOUND CARE) ×4 IMPLANT
ELECT REM PT RETURN 9FT ADLT (ELECTROSURGICAL)
ELECTRODE REM PT RTRN 9FT ADLT (ELECTROSURGICAL) IMPLANT
GAUZE 4X4 16PLY RFD (DISPOSABLE) ×4 IMPLANT
GAUZE VASELINE 1X8 (GAUZE/BANDAGES/DRESSINGS) IMPLANT
GLOVE BIO SURGEON STRL SZ 6.5 (GLOVE) ×3 IMPLANT
GLOVE BIO SURGEONS STRL SZ 6.5 (GLOVE) ×1
GLOVE BIOGEL PI IND STRL 6.5 (GLOVE) ×4 IMPLANT
GLOVE BIOGEL PI IND STRL 7.0 (GLOVE) ×4 IMPLANT
GLOVE BIOGEL PI INDICATOR 6.5 (GLOVE) ×4
GLOVE BIOGEL PI INDICATOR 7.0 (GLOVE) ×4
GOWN STRL REUS W/ TWL XL LVL3 (GOWN DISPOSABLE) ×2 IMPLANT
GOWN STRL REUS W/TWL LRG LVL3 (GOWN DISPOSABLE) ×8 IMPLANT
GOWN STRL REUS W/TWL XL LVL3 (GOWN DISPOSABLE) ×2
IV NS IRRIG 3000ML ARTHROMATIC (IV SOLUTION) ×4 IMPLANT
KIT PROCEDURE FLUENT (KITS) ×4 IMPLANT
KIT TURNOVER CYSTO (KITS) ×4 IMPLANT
LIGASURE BLUNT TIP 5 LONG 44CM (ELECTROSURGICAL) IMPLANT
NEEDLE INSUFFLATION 120MM (ENDOMECHANICALS) ×4 IMPLANT
NS IRRIG 500ML POUR BTL (IV SOLUTION) IMPLANT
PACK LAPAROSCOPY BASIN (CUSTOM PROCEDURE TRAY) ×4 IMPLANT
PACK TRENDGUARD 450 HYBRID PRO (MISCELLANEOUS) IMPLANT
PACK VAGINAL MINOR WOMEN LF (CUSTOM PROCEDURE TRAY) ×4 IMPLANT
PAD OB MATERNITY 4.3X12.25 (PERSONAL CARE ITEMS) ×4 IMPLANT
PROTECTOR NERVE ULNAR (MISCELLANEOUS) ×8 IMPLANT
SUT NOVA NAB DX-16 0-1 5-0 T12 (SUTURE) ×4 IMPLANT
SUT SILK 3 0 SH CR/8 (SUTURE) ×4 IMPLANT
SUT VIC AB 3-0 SH 8-18 (SUTURE) ×4 IMPLANT
SUT VICRYL 0 UR6 27IN ABS (SUTURE) ×4 IMPLANT
SUT VICRYL RAPIDE 4/0 PS 2 (SUTURE) ×4 IMPLANT
TOWEL OR 17X26 10 PK STRL BLUE (TOWEL DISPOSABLE) ×8 IMPLANT
TRENDGUARD 450 HYBRID PRO PACK (MISCELLANEOUS)
TROCAR XCEL BLUNT TIP 100MML (ENDOMECHANICALS) ×4 IMPLANT
TROCAR XCEL NON-BLD 11X100MML (ENDOMECHANICALS) ×4 IMPLANT
TROCAR XCEL NON-BLD 5MMX100MML (ENDOMECHANICALS) IMPLANT
TUBING EVAC SMOKE HEATED PNEUM (TUBING) ×4 IMPLANT
WARMER LAPAROSCOPE (MISCELLANEOUS) ×4 IMPLANT
WATER STERILE IRR 500ML POUR (IV SOLUTION) ×4 IMPLANT

## 2018-06-27 NOTE — Transfer of Care (Signed)
  Last Vitals:  Vitals Value Taken Time  BP    Temp    Pulse    Resp    SpO2      Last Pain:  Vitals:   06/27/18 0620  TempSrc: Oral  PainSc: 0-No pain      Patients Stated Pain Goal: 7 (06/27/18 2419) Immediate Anesthesia Transfer of Care Note  Patient: Dejai Schubach  Procedure(s) Performed: Procedure(s) (LRB): LAPAROSCOPIC TUBAL LIGATION (Bilateral) DILATATION & CURETTAGE/HYSTEROSCOPY WITH NOVASURE ABLATION (N/A)  Patient Location: PACU  Anesthesia Type: General  Level of Consciousness: awake, alert  and oriented  Airway & Oxygen Therapy: Patient Spontanous Breathing and Patient connected to nasal cannula oxygen  Post-op Assessment: Report given to PACU RN and Post -op Vital signs reviewed and stable  Post vital signs: Reviewed and stable  Complications: No apparent anesthesia complications

## 2018-06-27 NOTE — Anesthesia Postprocedure Evaluation (Signed)
Anesthesia Post Note  Patient: Zoe Soto  Procedure(s) Performed: LAPAROSCOPIC TUBAL LIGATION (Bilateral ) DILATATION & CURETTAGE/HYSTEROSCOPY WITH NOVASURE ABLATION (N/A ) SMALL BOWEL REPAIR (N/A )     Patient location during evaluation: PACU Anesthesia Type: General Level of consciousness: awake and alert Pain management: pain level controlled Vital Signs Assessment: post-procedure vital signs reviewed and stable Respiratory status: spontaneous breathing, nonlabored ventilation, respiratory function stable and patient connected to nasal cannula oxygen Cardiovascular status: blood pressure returned to baseline and stable Postop Assessment: no apparent nausea or vomiting Anesthetic complications: no    Last Vitals:  Vitals:   06/27/18 1115 06/27/18 1215  BP: (!) 142/92 (!) 140/92  Pulse: (!) 58 (!) 56  Resp: 16 18  Temp: 37.2 C 37.1 C  SpO2: 96% 96%    Last Pain:  Vitals:   06/27/18 1300  TempSrc:   PainSc: Asleep                 Ryan P Ellender

## 2018-06-27 NOTE — Anesthesia Procedure Notes (Signed)
Procedure Name: Intubation Date/Time: 06/27/2018 7:37 AM Performed by: Murvin Natal, MD Pre-anesthesia Checklist: Patient identified, Emergency Drugs available, Suction available and Patient being monitored Patient Re-evaluated:Patient Re-evaluated prior to induction Oxygen Delivery Method: Circle system utilized Preoxygenation: Pre-oxygenation with 100% oxygen Induction Type: IV induction Ventilation: Mask ventilation without difficulty Laryngoscope Size: Mac and 3 Tube type: Oral Tube size: 7.0 mm Number of attempts: 1 Airway Equipment and Method: Stylet and Oral airway Placement Confirmation: ETT inserted through vocal cords under direct vision,  positive ETCO2 and breath sounds checked- equal and bilateral Secured at: 21 cm Tube secured with: Tape Dental Injury: Teeth and Oropharynx as per pre-operative assessment

## 2018-06-27 NOTE — Op Note (Signed)
PREOPERATIVE DIAGNOSES: 1. Desires permanent sterility 2. AUB  POSTOPERATIVE DIAGNOSES: 1. Desires permanent sterility 2. AUB 3. Thermal bowel injury  PROCEDURE PERFORMED: Dilation, curretage, polypectomy, hysteroscopy, novasure endometrial ablation and bilateral tubal ligation Small bowel injury repair by Dr. Ninfa Linden  SURGEON: Dr. Gordy Councilman, MD - see separate operative report  ANESTHESIA: General  ESTIMATED BLOOD LOSS: 100cc.  COMPLICATIONS: Small bowel injury  TUBES: None.  DRAINS: None. Bladder drained immediately before procedure.   PATHOLOGY: Endometrial curretings and polyp  FINDINGS: On exam, under anesthesia, normal appearing vulva and vagina, 10 week sized uterus.   Operative findings demonstrated normal endometrial cavity  Procedure: The patient was taken to the operating room where she was properly prepped and draped in sterile manner under general anesthesia. After bimanual examination, the cervix was exposed with a weighted vaginal speculum and the anterior lip of the cervix grasped with a tenaculum.  A paracervical block was administered. The uterus was sounded to 9 mm. And the cervix noted to be approximately 4cm.The endocervical canal was then progressively dilated to 84mm. The hysteroscope was then introduced into the uterine cavity using sterile saline solution as a distending media and with attached video camera. The endometrial cavity was distended with fluids and the cavity with the b/l ostia were visualized. A normal appearing endometrial cavity was noted. Sharp curretage was then performed. The novasure device was tested outside the patient and noted to deploy properly. It was retracted and inserted into the uterus. It was easily deployed per package instructions and the cavity width measured 4cm. The cervical collar was snug. A cavity assessment was performed and passed. An endometrial ablation was done per package instructions with a time  of 30 seconds. The device was removed easily.   Attention was then turned to the abdomen.    An infraumbilical incision was made and the Veress needle was gently advanced taking care to feel for the typical sensation of penetrating the peritoneum. With CO2 infiltration, an opening pressure of 3 mmHg was noted, and following this, a pneumoperitoneum of 15 mmHg was created. A 11 mm trocar was then passed through the same incision and the laparoscope was then inserted through the trocar sleeve.  Visualization of the peritoneal cavity was then obtained and a brief inspection did not reveal any signs of complications from entry. However, the uterus was noted to have a white, blanched appearance in the left side adjacent to the cornua with some slight oozing. This raised suspicion for a full thickness thermal spread. The bowel behind the uterus was inspected and an obvious <2cm area of superficial white discoloration most c/w thermal injury was seen. The rest of the bowel was inspected - both small and large - and no further injuries were noted. GI was called at this time. In the meanwhile, the tubal ligation was performed.     Under direct observation, 90mm suprapubic port was placed taking care to respect anatomical landmarks and vessels. Once the placement of the port was complete, the actual laparoscopic procedure began. Above operative findings noted. A grasper was used to isolate the right fallopian tube following it down to its fimbriated end. Once confirmation of fallopian tube done, ligasure was used to dessicate and completely transect the fallopian tube. The same thing was done on the left fallopian tube.  Hemostasis was noted and a complete inspection confirmed proper cutting bilaterally. The small area of thermal injury to the uterus was noted to not be bleeding.  Dr. Nathaneil Canary then performed  his portion of the case.  All gas removed from abdomen and all instruments were removed. The skin closed with  4'0 vicryl. The sponge and lap counts were correct times 2 at this time.  Jacobs tenaculum was removed.    The patient's procedure was terminated. We then awakened her. She was sent to the Recovery Room in good condition.    Lucillie Garfinkel MD

## 2018-06-27 NOTE — Progress Notes (Signed)
Day of Surgery Procedure(s) (LRB): LAPAROSCOPIC TUBAL LIGATION (Bilateral) DILATATION & CURETTAGE/HYSTEROSCOPY WITH NOVASURE ABLATION (N/A) SMALL BOWEL REPAIR (N/A)  Subjective: Patient reports incisional pain.  No complaints - just mild soreness  Objective: I have reviewed patient's vital signs, intake and output and medications.  General: alert, cooperative and appears stated age Resp: clear to auscultation bilaterally Cardio: regular rate and rhythm, S1, S2 normal, no murmur, click, rub or gallop GI: soft, non-tender; bowel sounds normal; no masses,  no organomegaly and incision: clean, dry and intact Extremities: extremities normal, atraumatic, no cyanosis or edema Vaginal Bleeding: none  Assessment: s/p Procedure(s): LAPAROSCOPIC TUBAL LIGATION (Bilateral) DILATATION & CURETTAGE/HYSTEROSCOPY WITH NOVASURE ABLATION (N/A) SMALL BOWEL REPAIR (N/A): stable and progressing well  Plan: Encourage ambulation Advance to PO medication Surgery dw patient and husband in detail. All questions answered.   LOS: 0 days    Tyson Dense 06/27/2018, 2:11 PM

## 2018-06-27 NOTE — Op Note (Signed)
   Zoe Soto 06/27/2018   Pre-op Diagnosis: small bowel cautery injury     Post-op Diagnosis: same  Procedure(s): PRIMARY REPAIR OF SMALL BOWEL CAUTERY INJURY  Surgeon(s): Tyson Dense, MD Coralie Keens, MD  Anesthesia: General  Staff:  Circulator: Josem Kaufmann, RN Scrub Person: Nathen May, CST  Estimated Blood Loss: Minimal               Indications: I was asked to come urgently to the operating room for intraoperative consultation regarding a cautery injury to the small bowel during laparoscopic procedure.  Findings: There was about a 2 cm area of burn superficially to a loop of small bowel.  I primarily repair this.  There was no full-thickness injury.  Procedure: The endometrial portion of the procedure had already completed.  We could easily identify the loop of small bowel with the cautery injury.  I grasped it with a Babcock clamp.  I then pulled up to the umbilicus.  I made the fascial incision larger and then removed the trocar.  I then eviscerated the small loop of small bowel.  The injury was not full-thickness that was almost 2 cm in size.  I imbricated transversely with several interrupted silk sutures.  Again, there is no full-thickness injury.  At this point, we placed this back into the abdominal cavity.  Hemostasis appeared to be achieved.  I then closed the fascia with 2 separate #1 Novafil sutures.  At this point, Dr. Royston Sinner completed her portion of the procedure.          Coralie Keens   Date: 06/27/2018  Time: 9:03 AM

## 2018-06-27 NOTE — Brief Op Note (Signed)
06/27/2018  1:54 PM  PATIENT:  Zoe Soto  38 y.o. female  PRE-OPERATIVE DIAGNOSIS:  desires sterility  POST-OPERATIVE DIAGNOSIS:  desires sterility: Injury to small bowel from Novasure Ablation: Perforation to Uterus  PROCEDURE:  Procedure(s): LAPAROSCOPIC TUBAL LIGATION (Bilateral) DILATATION & CURETTAGE/HYSTEROSCOPY WITH NOVASURE ABLATION (N/A) SMALL BOWEL REPAIR (N/A)  SURGEON:  Surgeon(s) and Role:    * Thorsten Climer, Colin Benton, MD - Primary    * Coralie Keens, MD - Assisting  PHYSICIAN ASSISTANT:   ASSISTANTS: none   ANESTHESIA:   general  EBL:  20 mL   BLOOD ADMINISTERED:none  DRAINS: none   LOCAL MEDICATIONS USED:  NONE  SPECIMEN:  Source of Specimen:  endometrial curettings  DISPOSITION OF SPECIMEN:  PATHOLOGY  COUNTS:  YES  TOURNIQUET:  * No tourniquets in log *  DICTATION: .Note written in EPIC  PLAN OF CARE: Admit for overnight observation  PATIENT DISPOSITION:  PACU - hemodynamically stable.   Delay start of Pharmacological VTE agent (>24hrs) due to surgical blood loss or risk of bleeding: not applicable

## 2018-06-27 NOTE — Anesthesia Preprocedure Evaluation (Addendum)
Anesthesia Evaluation  Patient identified by MRN, date of birth, ID band Patient awake    Reviewed: Allergy & Precautions, NPO status , Patient's Chart, lab work & pertinent test results  Airway Mallampati: II  TM Distance: >3 FB Neck ROM: Full    Dental  (+) Chipped,    Pulmonary asthma ,    Pulmonary exam normal breath sounds clear to auscultation       Cardiovascular negative cardio ROS Normal cardiovascular exam Rhythm:Regular Rate:Normal     Neuro/Psych negative neurological ROS  negative psych ROS   GI/Hepatic negative GI ROS, Neg liver ROS,   Endo/Other  negative endocrine ROS  Renal/GU negative Renal ROS     Musculoskeletal negative musculoskeletal ROS (+)   Abdominal (+) + obese,   Peds  Hematology negative hematology ROS (+)   Anesthesia Other Findings Desires sterility  Reproductive/Obstetrics hcg negative                            Anesthesia Physical Anesthesia Plan  ASA: II  Anesthesia Plan: General   Post-op Pain Management:    Induction: Intravenous  PONV Risk Score and Plan: 4 or greater and Scopolamine patch - Pre-op, Midazolam, Dexamethasone, Ondansetron and Treatment may vary due to age or medical condition  Airway Management Planned: Oral ETT  Additional Equipment:   Intra-op Plan:   Post-operative Plan: Extubation in OR  Informed Consent: I have reviewed the patients History and Physical, chart, labs and discussed the procedure including the risks, benefits and alternatives for the proposed anesthesia with the patient or authorized representative who has indicated his/her understanding and acceptance.     Dental advisory given  Plan Discussed with: CRNA  Anesthesia Plan Comments:        Anesthesia Quick Evaluation

## 2018-06-28 DIAGNOSIS — Z302 Encounter for sterilization: Secondary | ICD-10-CM | POA: Diagnosis not present

## 2018-06-28 LAB — CBC
HCT: 35.1 % — ABNORMAL LOW (ref 36.0–46.0)
Hemoglobin: 10.9 g/dL — ABNORMAL LOW (ref 12.0–15.0)
MCH: 24.7 pg — ABNORMAL LOW (ref 26.0–34.0)
MCHC: 31.1 g/dL (ref 30.0–36.0)
MCV: 79.6 fL — ABNORMAL LOW (ref 80.0–100.0)
NRBC: 0 % (ref 0.0–0.2)
Platelets: 314 10*3/uL (ref 150–400)
RBC: 4.41 MIL/uL (ref 3.87–5.11)
RDW: 16.3 % — ABNORMAL HIGH (ref 11.5–15.5)
WBC: 9.8 10*3/uL (ref 4.0–10.5)

## 2018-06-28 MED ORDER — DOCUSATE SODIUM 100 MG PO CAPS: 100.0000 mg | ORAL_CAPSULE | Freq: Two times a day (BID) | ORAL | 0 refills | Status: DC

## 2018-06-28 MED ORDER — ACETAMINOPHEN 500 MG PO TABS
ORAL_TABLET | ORAL | Status: AC
  Filled 2018-06-28: qty 2

## 2018-06-28 MED ORDER — OXYCODONE HCL 5 MG PO TABS: 5.0000 mg | ORAL_TABLET | ORAL | 0 refills | Status: DC | PRN

## 2018-06-28 NOTE — Progress Notes (Signed)
1 Day Post-Op Procedure(s) (LRB): LAPAROSCOPIC TUBAL LIGATION (Bilateral) DILATATION & CURETTAGE/HYSTEROSCOPY WITH NOVASURE ABLATION (N/A) SMALL BOWEL REPAIR (N/A)  Subjective: Patient reports tolerating PO and no problems voiding.  Minimal pain with only taking tylenol.   Objective: I have reviewed patient's vital signs, intake and output and medications.  General: alert, cooperative and appears stated age Resp: clear to auscultation bilaterally Cardio: regular rate and rhythm, S1, S2 normal, no murmur, click, rub or gallop GI: soft, non-tender; bowel sounds normal; no masses,  no organomegaly and incision: clean, dry and intact Extremities: extremities normal, atraumatic, no cyanosis or edema Vaginal Bleeding: none  Assessment: s/p Procedure(s): LAPAROSCOPIC TUBAL LIGATION (Bilateral) DILATATION & CURETTAGE/HYSTEROSCOPY WITH NOVASURE ABLATION (N/A) SMALL BOWEL REPAIR (N/A): stable, progressing well and tolerating diet  Plan: Discharge home  LOS: 0 days    Tyson Dense 06/28/2018, 8:27 AM

## 2018-06-28 NOTE — Progress Notes (Signed)
Pt set up with breast pump to pump b/c she is breast feeding and leaking no questions or complaints.

## 2018-06-28 NOTE — Discharge Summary (Signed)
Physician Discharge Summary  Patient ID: Zoe Soto MRN: [id number] DOB/AGE: [birth year] 38 y.o.  Admit date: 06/27/2018 Discharge date: 06/28/2018  Admission Diagnoses: AUB  Discharge Diagnoses:  Active Problems:   H/O laparoscopy Thermal bowel injury  Discharged Condition: good  Hospital Course: 38yo presented for scheduled novasure endometrial ablation and LSC BTL. Intraoperative course c/b small thermal bowel injury found at time of BTL. General surgery consulted intraop and sutured the area to assist healing. She was admitted for observation. On POD#1 she was tolerating all goals and was stable for discharge. The complication and expected postoperative course were discussed in detail with the patient and her husband. Strict precautions were given and she is to follow up with both myself and general surgery.   Consults: general surgery  Significant Diagnostic Studies: labs:  CBC    Component Value Date/Time   WBC 9.8 06/28/2018 0550   RBC 4.41 06/28/2018 0550   HGB 10.9 (L) 06/28/2018 0550   HCT 35.1 (L) 06/28/2018 0550   PLT 314 06/28/2018 0550   MCV 79.6 (L) 06/28/2018 0550   MCH 24.7 (L) 06/28/2018 0550   MCHC 31.1 06/28/2018 0550   RDW 16.3 (H) 06/28/2018 0550     Treatments: surgery: [place] [facility] endometrial ablation, LSC BTL, repair of small bowel thermal injury  Discharge Exam: Blood pressure 134/78, pulse (!) 51, temperature 98.4 F (36.9 C), resp. rate 16, height 5\' 5"  (1.651 m), weight 107.6 kg, last menstrual period 08/21/2017, SpO2 100 %, currently breastfeeding. General appearance: alert, cooperative and appears stated age Resp: clear to auscultation bilaterally Cardio: regular rate and rhythm, S1, S2 normal, no murmur, click, rub or gallop GI: soft, non-tender; bowel sounds normal; no masses,  no organomegaly Extremities: extremities normal, atraumatic, no cyanosis or edema Incision/Wound: c/d/i. No vaginal bleeding.  Disposition: Discharge disposition:  01-Home or Self Care       Discharge Instructions    Call MD for:   Complete by:  As directed    Heavy vaginal bleeding or abnormal vaginal discharge   Call MD for:  difficulty breathing, headache or visual disturbances   Complete by:  As directed    Call MD for:  persistant nausea and vomiting   Complete by:  As directed    Call MD for:  redness, tenderness, or signs of infection (pain, swelling, redness, odor or green/yellow discharge around incision site)   Complete by:  As directed    Call MD for:  severe uncontrolled pain   Complete by:  As directed    Call MD for:  temperature >100.4   Complete by:  As directed    Diet general   Complete by:  As directed    Discharge instructions   Complete by:  As directed    Call with inability to pass gas or constipation.   Driving Restrictions   Complete by:  As directed    Do not drive until you are off narcotic pain medications and you feel like you can react in an emergency.   Increase activity slowly   Complete by:  As directed    Leave dressing on - Keep it clean, dry, and intact until clinic visit   Complete by:  As directed    Lifting restrictions   Complete by:  As directed    Don't lift anything more than 15-20 pounds   Sexual Activity Restrictions   Complete by:  As directed    Nothing in the vagina x 4 weeks. We will discuss at clinic  visit.     Allergies as of 06/28/2018      Reactions   Naproxen Other (See Comments)   Hematuria when taken in large doses   Tape Rash   ADHESIVE AND PAPER TAPE REMOVES SKIN      Medication List    STOP taking these medications   EPINEPHrine 0.3 mg/0.3 mL Soaj injection Commonly known as:  EPI-PEN     TAKE these medications   albuterol 108 (90 Base) MCG/ACT inhaler Commonly known as:  PROVENTIL HFA;VENTOLIN HFA Inhale 2 puffs into the lungs as needed.   docusate sodium 100 MG capsule Commonly known as:  COLACE Take 1 capsule (100 mg total) by mouth 2 (two) times daily.    oxyCODONE 5 MG immediate release tablet Commonly known as:  Oxy IR/ROXICODONE Take 1-2 tablets (5-10 mg total) by mouth every 4 (four) hours as needed for moderate pain.        Signed: Tyson Dense 06/28/2018, 8:31 AM

## 2018-06-28 NOTE — Progress Notes (Signed)
1 Day Post-Op   Subjective/Chief Complaint: Comfortable from abdominal standpoint Tolerating liquids   Objective: Vital signs in last 24 hours: Temp:  [98 F (36.7 C)-99.1 F (37.3 C)] 98.1 F (36.7 C) (02/22 0553) Pulse Rate:  [49-94] 55 (02/22 0553) Resp:  [16-20] 18 (02/22 0553) BP: (133-184)/(80-115) 133/82 (02/22 0553) SpO2:  [92 %-100 %] 97 % (02/22 0553)    Intake/Output from previous day: 02/21 0701 - 02/22 0700 In: 3735.4 [I.V.:3735.4] Out: 2820 [Urine:2800; Blood:20] Intake/Output this shift: No intake/output data recorded.  Exam: Abdomen soft, non-distended, minimally tender  Lab Results:  Recent Labs    06/28/18 0550  WBC 9.8  HGB 10.9*  HCT 35.1*  PLT 314   BMET No results for input(s): NA, K, CL, CO2, GLUCOSE, BUN, CREATININE, CALCIUM in the last 72 hours. PT/INR No results for input(s): LABPROT, INR in the last 72 hours. ABG No results for input(s): PHART, HCO3 in the last 72 hours.  Invalid input(s): PCO2, PO2  Studies/Results: No results found.  Anti-infectives: Anti-infectives (From admission, onward)   None      Assessment/Plan: s/p Procedure(s): LAPAROSCOPIC TUBAL LIGATION (Bilateral) DILATATION & CURETTAGE/HYSTEROSCOPY WITH NOVASURE ABLATION (N/A) SMALL BOWEL REPAIR (N/A)  Stable post op Ok to discharge from general surgery standpoint   LOS: 0 days    Coralie Keens 06/28/2018

## 2018-06-30 ENCOUNTER — Encounter (HOSPITAL_BASED_OUTPATIENT_CLINIC_OR_DEPARTMENT_OTHER): Payer: Self-pay | Admitting: Obstetrics and Gynecology

## 2018-09-25 ENCOUNTER — Ambulatory Visit (INDEPENDENT_AMBULATORY_CARE_PROVIDER_SITE_OTHER): Payer: Managed Care, Other (non HMO) | Admitting: Physician Assistant

## 2018-09-25 ENCOUNTER — Encounter: Payer: Self-pay | Admitting: Physician Assistant

## 2018-09-25 VITALS — Temp 98.8°F | Ht 65.0 in | Wt 228.0 lb

## 2018-09-25 DIAGNOSIS — J0110 Acute frontal sinusitis, unspecified: Secondary | ICD-10-CM

## 2018-09-25 MED ORDER — AMOXICILLIN-POT CLAVULANATE 875-125 MG PO TABS: 1.0000 | ORAL_TABLET | Freq: Two times a day (BID) | ORAL | 0 refills | Status: DC

## 2018-09-25 NOTE — Patient Instructions (Signed)
Instructions sent to MyChart.   Please take antibiotic as directed.  Increase fluid intake.  Use Saline nasal spray.  Take a daily multivitamin. Use the Fluticasone nasal spray as directed.  Place a humidifier in the bedroom.  Please call or return clinic if symptoms are not improving.  For stress and mood, consider downloading the Calm or Headspace application to work on some exercises that will help with stress and mood.   Sinusitis Sinusitis is redness, soreness, and swelling (inflammation) of the paranasal sinuses. Paranasal sinuses are air pockets within the bones of your face (beneath the eyes, the middle of the forehead, or above the eyes). In healthy paranasal sinuses, mucus is able to drain out, and air is able to circulate through them by way of your nose. However, when your paranasal sinuses are inflamed, mucus and air can become trapped. This can allow bacteria and other germs to grow and cause infection. Sinusitis can develop quickly and last only a short time (acute) or continue over a long period (chronic). Sinusitis that lasts for more than 12 weeks is considered chronic.  CAUSES  Causes of sinusitis include:  Allergies.  Structural abnormalities, such as displacement of the cartilage that separates your nostrils (deviated septum), which can decrease the air flow through your nose and sinuses and affect sinus drainage.  Functional abnormalities, such as when the small hairs (cilia) that line your sinuses and help remove mucus do not work properly or are not present. SYMPTOMS  Symptoms of acute and chronic sinusitis are the same. The primary symptoms are pain and pressure around the affected sinuses. Other symptoms include:  Upper toothache.  Earache.  Headache.  Bad breath.  Decreased sense of smell and taste.  A cough, which worsens when you are lying flat.  Fatigue.  Fever.  Thick drainage from your nose, which often is green and may contain pus (purulent).   Swelling and warmth over the affected sinuses. DIAGNOSIS  Your caregiver will perform a physical exam. During the exam, your caregiver may:  Look in your nose for signs of abnormal growths in your nostrils (nasal polyps).  Tap over the affected sinus to check for signs of infection.  View the inside of your sinuses (endoscopy) with a special imaging device with a light attached (endoscope), which is inserted into your sinuses. If your caregiver suspects that you have chronic sinusitis, one or more of the following tests may be recommended:  Allergy tests.  Nasal culture A sample of mucus is taken from your nose and sent to a lab and screened for bacteria.  Nasal cytology A sample of mucus is taken from your nose and examined by your caregiver to determine if your sinusitis is related to an allergy. TREATMENT  Most cases of acute sinusitis are related to a viral infection and will resolve on their own within 10 days. Sometimes medicines are prescribed to help relieve symptoms (pain medicine, decongestants, nasal steroid sprays, or saline sprays).  However, for sinusitis related to a bacterial infection, your caregiver will prescribe antibiotic medicines. These are medicines that will help kill the bacteria causing the infection.  Rarely, sinusitis is caused by a fungal infection. In theses cases, your caregiver will prescribe antifungal medicine. For some cases of chronic sinusitis, surgery is needed. Generally, these are cases in which sinusitis recurs more than 3 times per year, despite other treatments. HOME CARE INSTRUCTIONS   Drink plenty of water. Water helps thin the mucus so your sinuses can drain more easily.  Use a humidifier.  Inhale steam 3 to 4 times a day (for example, sit in the bathroom with the shower running).  Apply a warm, moist washcloth to your face 3 to 4 times a day, or as directed by your caregiver.  Use saline nasal sprays to help moisten and clean your  sinuses.  Take over-the-counter or prescription medicines for pain, discomfort, or fever only as directed by your caregiver. SEEK IMMEDIATE MEDICAL CARE IF:  You have increasing pain or severe headaches.  You have nausea, vomiting, or drowsiness.  You have swelling around your face.  You have vision problems.  You have a stiff neck.  You have difficulty breathing. MAKE SURE YOU:   Understand these instructions.  Will watch your condition.  Will get help right away if you are not doing well or get worse. Document Released: 04/23/2005 Document Revised: 07/16/2011 Document Reviewed: 05/08/2011 [facility] Patient Information 2014 [place], [place].

## 2018-09-25 NOTE — Progress Notes (Signed)
I have discussed the procedure for the virtual visit with the patient who has given consent to proceed with assessment and treatment.   Cortney Mckinney, CMA     

## 2018-09-25 NOTE — Progress Notes (Signed)
   Virtual Visit via Video   I connected with patient on 09/25/18 at 11:30 AM EDT by a video enabled telemedicine application and verified that I am speaking with the correct person using two identifiers.  Location patient: Home Location provider: [facility], Office Persons participating in the virtual visit: Patient, Provider, [place] ([place])  I discussed the limitations of evaluation and management by telemedicine and the availability of in person appointments. The patient expressed understanding and agreed to proceed.  Subjective:   HPI:   Patient presents via [facility] today c/o sinus congestion, sinus pressure with significant sinus infection. Notes symptoms started initially last Friday with mild nasal congestion, ear pressure/popping and sense of ear fullness with L > R.  Now with ear pain, sinus pain and headache. Denies fever, chills, chest congestion or SOB.  ROS:   See pertinent positives and negatives per HPI.  Patient Active Problem List   Diagnosis Date Noted  . H/O laparoscopy 06/27/2018  . Acute bacterial sinusitis 02/21/2017  . Intramural leiomyoma of uterus 05/26/2013  . Irregular menstruation 08/04/2010    Social History   Tobacco Use  . Smoking status: Never Smoker  . Smokeless tobacco: Never Used  Substance Use Topics  . Alcohol use: Not Currently    Current Outpatient Medications:  .  Biotin 1 MG CAPS, Take by mouth., Disp: , Rfl:  .  Cyanocobalamin (B-12) 500 MCG TABS, Take by mouth., Disp: , Rfl:  .  EPINEPHrine 0.3 mg/0.3 mL IJ SOAJ injection, epinephrine 0.3 mg/0.3 mL injection, auto-injector, Disp: , Rfl:  .  Ferrous Sulfate (IRON PO), Take by mouth., Disp: , Rfl:  .  Multiple Minerals-Vitamins (CALCIUM & VIT D3 BONE HEALTH PO), Take by mouth daily., Disp: , Rfl:  .  Multiple Vitamin (MULTI-VITAMIN PO), Take by mouth., Disp: , Rfl:  .  albuterol (PROVENTIL HFA;VENTOLIN HFA) 108 (90 Base) MCG/ACT inhaler, Inhale 2 puffs into the  lungs as needed., Disp: , Rfl:  .  docusate sodium (COLACE) 100 MG capsule, Take 1 capsule (100 mg total) by mouth 2 (two) times daily., Disp: 60 capsule, Rfl: 0 .  oxyCODONE (OXY IR/ROXICODONE) 5 MG immediate release tablet, Take 1-2 tablets (5-10 mg total) by mouth every 4 (four) hours as needed for moderate pain., Disp: 30 tablet, Rfl: 0  Allergies  Allergen Reactions  . Wasp Venom Anaphylaxis and Hives  . Naproxen Other (See Comments)    Hematuria when taken in large doses  . Beeswax   . Tape Rash    ADHESIVE AND PAPER TAPE REMOVES SKIN    Objective:   Temp 98.8 F (37.1 C)   Ht 5\' 5"  (1.651 m)   Wt 228 lb (103.4 kg)   BMI 37.94 kg/m   Patient is well-developed, well-nourished in no acute distress.  Resting comfortably at home.  Head is normocephalic, atraumatic.  No labored breathing.  Speech is clear and coherent with logical content.  Patient is alert and oriented at baseline.  + TTP of sinuses on exam (frontal only) with patient assistance  Assessment and Plan:   1. Acute non-recurrent frontal sinusitis Rx Augmentin.  Increase fluids.  Rest.  Saline nasal spray.  Probiotic.  Mucinex as directed.  Humidifier in bedroom. Flonase as directed.  Call or return to clinic if symptoms are not improving.     Leeanne Rio, PA-C 09/25/2018

## 2019-01-02 DIAGNOSIS — S92515A Nondisplaced fracture of proximal phalanx of left lesser toe(s), initial encounter for closed fracture: Secondary | ICD-10-CM | POA: Diagnosis not present

## 2019-01-14 DIAGNOSIS — M84375A Stress fracture, left foot, initial encounter for fracture: Secondary | ICD-10-CM | POA: Diagnosis not present

## 2019-01-21 ENCOUNTER — Telehealth: Payer: Self-pay | Admitting: General Practice

## 2019-01-21 DIAGNOSIS — M84375D Stress fracture, left foot, subsequent encounter for fracture with routine healing: Secondary | ICD-10-CM | POA: Diagnosis not present

## 2019-01-21 NOTE — Telephone Encounter (Signed)
Called and spoke with pt. She states that she has had palpitations since she was 16. She thinks that the stress and anxiety lately is causing them to worsen. Pt denies any SOB, chest pain, has some tightness but again she contributes it to her anxiety. Spoke with Dr. Birdie Riddle who had me advise pt that if her symptoms worsen she should be seen at ER. Pt stated an understanding. Pt was scheduled for appt on Monday at 11am with PCP.   I would use [facility] F/U on Monday for pt so that she can be seen ASAP for depression and in a 30 minute slot. Regarding her irregular heartbeat and dizziness, this needs to be triaged    Thanks!  KT  ----- Message -----  From: Wynell Balloon  Sent: 01/21/2019  2:30 PM EDT  To: Midge Minium, MD  Subject: FW: Appointment scheduled from [place]        Please advise if pt can wait until next Tuesday to be seen.   ----- Message -----  From: Zoe Soto  Sent: 01/21/2019  2:27 PM EDT  To: [facility]  Subject: Appointment scheduled from [place]          Appointment For: Zoe Soto (PO:[id number])  Visit Type: OFFICE VISIT (1004)    01/27/2019  8:00 AM 15 mins. Brunetta Jeans, PA-C  [facility]    Patient Comments:  I have an irregular heartbeat that makes me lightheaded.  Depression resulting from the death of my 25 month old son.

## 2019-01-23 DIAGNOSIS — Z20828 Contact with and (suspected) exposure to other viral communicable diseases: Secondary | ICD-10-CM | POA: Diagnosis not present

## 2019-01-26 ENCOUNTER — Ambulatory Visit: Payer: BC Managed Care – PPO | Admitting: Physician Assistant

## 2019-01-26 ENCOUNTER — Other Ambulatory Visit: Payer: Self-pay | Admitting: Emergency Medicine

## 2019-01-26 ENCOUNTER — Other Ambulatory Visit: Payer: Self-pay

## 2019-01-26 ENCOUNTER — Encounter: Payer: Self-pay | Admitting: Physician Assistant

## 2019-01-26 VITALS — BP 120/80 | HR 62 | Temp 98.1°F | Resp 16 | Ht 65.0 in | Wt 235.0 lb

## 2019-01-26 DIAGNOSIS — F419 Anxiety disorder, unspecified: Secondary | ICD-10-CM | POA: Diagnosis not present

## 2019-01-26 DIAGNOSIS — F32A Depression, unspecified: Secondary | ICD-10-CM

## 2019-01-26 DIAGNOSIS — R002 Palpitations: Secondary | ICD-10-CM | POA: Diagnosis not present

## 2019-01-26 DIAGNOSIS — F329 Major depressive disorder, single episode, unspecified: Secondary | ICD-10-CM | POA: Diagnosis not present

## 2019-01-26 LAB — CBC WITH DIFFERENTIAL/PLATELET
Basophils Absolute: 0 10*3/uL (ref 0.0–0.1)
Basophils Relative: 0.3 % (ref 0.0–3.0)
Eosinophils Absolute: 0.2 10*3/uL (ref 0.0–0.7)
Eosinophils Relative: 4 % (ref 0.0–5.0)
HCT: 37.9 % (ref 36.0–46.0)
Hemoglobin: 12.2 g/dL (ref 12.0–15.0)
Lymphocytes Relative: 44.8 % (ref 12.0–46.0)
Lymphs Abs: 2.4 10*3/uL (ref 0.7–4.0)
MCHC: 32.2 g/dL (ref 30.0–36.0)
MCV: 77 fl — ABNORMAL LOW (ref 78.0–100.0)
Monocytes Absolute: 0.3 10*3/uL (ref 0.1–1.0)
Monocytes Relative: 5.9 % (ref 3.0–12.0)
Neutro Abs: 2.4 10*3/uL (ref 1.4–7.7)
Neutrophils Relative %: 45 % (ref 43.0–77.0)
Platelets: 358 10*3/uL (ref 150.0–400.0)
RBC: 4.92 Mil/uL (ref 3.87–5.11)
RDW: 17.3 % — ABNORMAL HIGH (ref 11.5–15.5)
WBC: 5.4 10*3/uL (ref 4.0–10.5)

## 2019-01-26 LAB — BASIC METABOLIC PANEL
BUN: 14 mg/dL (ref 6–23)
CO2: 28 mEq/L (ref 19–32)
Calcium: 9.2 mg/dL (ref 8.4–10.5)
Chloride: 104 mEq/L (ref 96–112)
Creatinine, Ser: 0.95 mg/dL (ref 0.40–1.20)
GFR: 78.79 mL/min (ref >60.00)
Glucose, Bld: 81 mg/dL (ref 70–99)
Potassium: 4.2 mEq/L (ref 3.5–5.1)
Sodium: 138 mEq/L (ref 135–145)

## 2019-01-26 LAB — TSH: TSH: 1.88 u[IU]/mL (ref 0.35–4.50)

## 2019-01-26 MED ORDER — SERTRALINE HCL 25 MG PO TABS: 25.0000 mg | ORAL_TABLET | Freq: Every day | ORAL | 1 refills | Status: DC

## 2019-01-26 NOTE — Patient Instructions (Signed)
Please go to the lab today for blood work.  I will call you with your results. We will alter treatment regimen(s) if indicated by your results.   You will be contacted for a Holter Monitor study.  Please use the handout given to schedule an appointment with counseling. This will be very beneficial. Start the Sertraline 25 mg once daily as directed.  Follow-up with me in 3 weeks for repeat assessment.

## 2019-01-26 NOTE — Progress Notes (Signed)
Patient presents to clinic today to discuss multiple ongoing issues. Patient endorses episodes of palpitations described as racing heart, lasting about 2-3 minutes. Notes these started at age 38. Had a CPE and was noted to have an "irregular heartbeat" at that time. EKG at time and was told looked ok. No further testing performed. Notes this was infrequent but over past couple of months, now occurring every other day, lasting a few minutes at a time before resolving. Can be sitting and heart rate increases significantly 50/60s to 119. Occasional lightheadedness with it. No chest pain or SOB. No diaphoresis with this.   Caffeine -- 1 cup coffee Alcohol -- a glass of wine every few days.  Hydrated -- drinking water all of the time.   + hx of anemia -- taking supplements.  Patient notes feeling overwhelmed, angry, depressed with anhedonia over the past several months starting with the loss of her unborn child due to cardiac complications. Since then she and her husband are having a rough time and seeking marriage counseling. Notes that her best friend passed away in 12/04/2022. Has been talking to mother -- good support. Looking for a new counselor. Has never been on medication but feels this is needed now as symptoms are having a significant impact on her day to day. Denies SI/HI.   Past Medical History:  Diagnosis Date  . Allergy    Seasonal  . Asthma   . Bee sting allergy   . History of chickenpox   . Pneumonia 08/2017   RESOLVED    Current Outpatient Medications on File Prior to Visit  Medication Sig Dispense Refill  . albuterol (PROVENTIL HFA;VENTOLIN HFA) 108 (90 Base) MCG/ACT inhaler Inhale 2 puffs into the lungs as needed.    . Ascorbic Acid (VITAMIN C) 1000 MG tablet Take 1,000 mg by mouth daily.    . Biotin 1 MG CAPS Take by mouth.    . Calcium-Magnesium-Vitamin D (CALCIUM 1200+D3 PO) Take 1 tablet by mouth daily.    . Cyanocobalamin (B-12) 500 MCG TABS Take by mouth.    .  EPINEPHrine 0.3 mg/0.3 mL IJ SOAJ injection epinephrine 0.3 mg/0.3 mL injection, auto-injector    . Ferrous Sulfate (IRON PO) Take by mouth.    . Multiple Vitamin (MULTI-VITAMIN PO) Take by mouth.     No current facility-administered medications on file prior to visit.     Allergies  Allergen Reactions  . Wasp Venom Anaphylaxis and Hives  . Naproxen Other (See Comments)    Hematuria when taken in large doses  . Beeswax   . Tape Rash    ADHESIVE AND PAPER TAPE REMOVES SKIN    Family History  Problem Relation Age of Onset  . Alcohol abuse Mother   . Heart attack Father 37  . Asthma Brother   . Diabetes Brother     Social History   Socioeconomic History  . Marital status: Married    Spouse name: Not on file  . Number of children: 2  . Years of education: 56  . Highest education level: Not on file  Occupational History  . Occupation: Administration  Social Needs  . Financial resource strain: Not on file  . Food insecurity    Worry: Not on file    Inability: Not on file  . Transportation needs    Medical: Not on file    Non-medical: Not on file  Tobacco Use  . Smoking status: Never Smoker  . Smokeless tobacco: Never Used  Substance  and Sexual Activity  . Alcohol use: Not Currently  . Drug use: No  . Sexual activity: Not Currently    Birth control/protection: Inserts    Comment: Nuvaring  Lifestyle  . Physical activity    Days per week: Not on file    Minutes per session: Not on file  . Stress: Not on file  Relationships  . Social Herbalist on phone: Not on file    Gets together: Not on file    Attends religious service: Not on file    Active member of club or organization: Not on file    Attends meetings of clubs or organizations: Not on file    Relationship status: Not on file  Other Topics Concern  . Not on file  Social History Narrative  . Not on file   Review of Systems - See HPI.  All other ROS are negative.  BP 120/80   Pulse 62    Temp 98.1 F (36.7 C) (Skin)   Resp 16   Ht 5\' 5"  (1.651 m)   Wt 235 lb (106.6 kg)   SpO2 99%   Breastfeeding No   BMI 39.11 kg/m   Physical Exam Vitals signs reviewed.  Constitutional:      Appearance: Normal appearance.  HENT:     Head: Normocephalic and atraumatic.  Eyes:     Conjunctiva/sclera: Conjunctivae normal.     Pupils: Pupils are equal, round, and reactive to light.  Neck:     Musculoskeletal: Neck supple.  Cardiovascular:     Rate and Rhythm: Regular rhythm. Bradycardia present.     Pulses: Normal pulses.     Heart sounds: Normal heart sounds.  Pulmonary:     Effort: Pulmonary effort is normal.     Breath sounds: Normal breath sounds.  Neurological:     General: No focal deficit present.     Mental Status: She is alert and oriented to person, place, and time.  Psychiatric:        Attention and Perception: Attention normal.        Mood and Affect: Mood is anxious and depressed. Affect is flat and tearful.        Speech: Speech normal.     Assessment/Plan: 1. Palpitations EKG today with sinus bradycardia. No old tracings to compare too. Vitals stable. Will check labs today and obtain Holter Study. Limit caffeine and alcohol. Increase fluids. Strict ER precautions reviewed. - EKG 12-Lead - CBC w/Diff - TSH - Holter monitor - 48 hour; Future - Basic metabolic panel  2. Anxiety and depression Handout on our counselors given so she can set up an appointment. Feel this will be very beneficial. Discussed medications. Will start Sertraline 25 mg once daily. Close follow-up scheduled.  - TSH   Leeanne Rio, PA-C

## 2019-01-27 ENCOUNTER — Ambulatory Visit: Payer: Managed Care, Other (non HMO) | Admitting: Physician Assistant

## 2019-01-27 ENCOUNTER — Other Ambulatory Visit (INDEPENDENT_AMBULATORY_CARE_PROVIDER_SITE_OTHER): Payer: BC Managed Care – PPO

## 2019-01-27 DIAGNOSIS — D509 Iron deficiency anemia, unspecified: Secondary | ICD-10-CM

## 2019-01-27 LAB — IBC PANEL
Iron: 247 ug/dL — ABNORMAL HIGH (ref 42–145)
Saturation Ratios: 75.1 % — ABNORMAL HIGH (ref 20.0–50.0)
Transferrin: 235 mg/dL (ref 212.0–360.0)

## 2019-02-02 ENCOUNTER — Other Ambulatory Visit: Payer: Self-pay | Admitting: Emergency Medicine

## 2019-02-02 DIAGNOSIS — Z862 Personal history of diseases of the blood and blood-forming organs and certain disorders involving the immune mechanism: Secondary | ICD-10-CM

## 2019-02-02 DIAGNOSIS — M84375D Stress fracture, left foot, subsequent encounter for fracture with routine healing: Secondary | ICD-10-CM | POA: Diagnosis not present

## 2019-02-06 ENCOUNTER — Ambulatory Visit: Payer: BC Managed Care – PPO | Admitting: Physician Assistant

## 2019-02-06 ENCOUNTER — Telehealth: Payer: Self-pay | Admitting: Emergency Medicine

## 2019-02-06 NOTE — Telephone Encounter (Signed)
Advised patient of PCP recommendations. Advised to go to urgent care or ER to be seen. She is agreeable.

## 2019-02-06 NOTE — Telephone Encounter (Signed)
Patient calling today stating she is having chest pain and right arm pain. She states the pain is behind her right breast. She is having some left shoulder pain.  Denies any sob, headache She is having some abdominal cramping but her menstrual cycle is to start next week.  Advised we can see her at 3:30 p but patient states she has a meeting at that time and can't be seen

## 2019-02-06 NOTE — Telephone Encounter (Signed)
If she cannot make the appointment time we have available she will need to be seen by another provider or if symptoms started acutely today needs urgent care or ER assessment

## 2019-02-16 ENCOUNTER — Ambulatory Visit: Payer: BC Managed Care – PPO | Admitting: Physician Assistant

## 2019-02-18 ENCOUNTER — Telehealth: Payer: Self-pay | Admitting: *Deleted

## 2019-02-18 ENCOUNTER — Encounter: Payer: Self-pay | Admitting: Physician Assistant

## 2019-02-18 ENCOUNTER — Other Ambulatory Visit: Payer: Self-pay

## 2019-02-18 ENCOUNTER — Ambulatory Visit (INDEPENDENT_AMBULATORY_CARE_PROVIDER_SITE_OTHER): Payer: BC Managed Care – PPO | Admitting: Physician Assistant

## 2019-02-18 VITALS — BP 120/76 | HR 59 | Temp 97.9°F | Resp 16 | Ht 65.0 in | Wt 233.0 lb

## 2019-02-18 DIAGNOSIS — F329 Major depressive disorder, single episode, unspecified: Secondary | ICD-10-CM

## 2019-02-18 DIAGNOSIS — F419 Anxiety disorder, unspecified: Secondary | ICD-10-CM | POA: Diagnosis not present

## 2019-02-18 MED ORDER — SERTRALINE HCL 50 MG PO TABS: 50.0000 mg | ORAL_TABLET | Freq: Every day | ORAL | 3 refills | Status: DC

## 2019-02-18 MED ORDER — ALPRAZOLAM 0.25 MG PO TABS: 0.2500 mg | ORAL_TABLET | Freq: Two times a day (BID) | ORAL | 0 refills | Status: DC | PRN

## 2019-02-18 NOTE — Telephone Encounter (Signed)
Message for monitor request never forwarded to monitor staff message.  Will enroll to have monitor mailed to patients home today.

## 2019-02-18 NOTE — Telephone Encounter (Signed)
New Message:    Zoe Soto from Dr Einar Pheasant [facility]'s office called. She wanted to know if pt had been scheduled for her Monitor?

## 2019-02-18 NOTE — Patient Instructions (Signed)
Please increase to the new dose of Sertraline daily.  I have sent in a script for a few tablets of Xanax to use as directed if needed for severe anxiety/panic.  Do not drive if taking one of these.   Please speak with Romelle Starcher at the front desk for an update regarding your Holter Study.   Get a night guard to wear while sleeping to help prevent tooth grinding -- this is from the stress.  We will follow-up in 4 weeks. Sooner if needed.

## 2019-02-18 NOTE — Telephone Encounter (Signed)
Apologized to patient for delay in contacting her to set up holter monitor requested by Brunetta Jeans, PA-C on 01/26/19.  Monitor request message from [facility] was inadvertently not forwarded to monitor staff message box. A 3 day ZIO XT long term holter monitor will be mailed to the patients home.  Instructions reviewed briefly as they are included in the monitor kit.  Patient is aware she needs to wear the monitor at least 48 hours.

## 2019-02-18 NOTE — Telephone Encounter (Signed)
Dr. Carlye Grippe office closed.  Will attempt to contact office again 02/19/19.

## 2019-02-18 NOTE — Progress Notes (Signed)
Patient presents to clinic today for follow-up of anxiety and depression. At last visit, patient was started on Sertraline 25 mg QD. Endorses taking as directed and tolerating well. Not noticing significant improvement yet. Having to fly to [facility] tomorrow for her brother's funeral. Feels shaky on the inside. Still having occasional palpitations -- has yet been contacted to set up Holter Study. Denies SI/HI. Feels overwhelmed with everything going on in life at the moment.    Past Medical History:  Diagnosis Date  . Allergy    Seasonal  . Asthma   . Bee sting allergy   . History of chickenpox   . Pneumonia 08/2017   RESOLVED    Current Outpatient Medications on File Prior to Visit  Medication Sig Dispense Refill  . albuterol (PROVENTIL HFA;VENTOLIN HFA) 108 (90 Base) MCG/ACT inhaler Inhale 2 puffs into the lungs as needed.    . Ascorbic Acid (VITAMIN C) 1000 MG tablet Take 1,000 mg by mouth daily.    . Biotin 1 MG CAPS Take by mouth.    . Calcium-Magnesium-Vitamin D (CALCIUM 1200+D3 PO) Take 1 tablet by mouth daily.    . Cyanocobalamin (B-12) 500 MCG TABS Take by mouth.    . EPINEPHrine 0.3 mg/0.3 mL IJ SOAJ injection epinephrine 0.3 mg/0.3 mL injection, auto-injector    . Ferrous Sulfate (IRON PO) Take by mouth.    . Multiple Vitamin (MULTI-VITAMIN PO) Take by mouth.    . sertraline (ZOLOFT) 25 MG tablet Take 1 tablet (25 mg total) by mouth daily. 30 tablet 1   No current facility-administered medications on file prior to visit.     Allergies  Allergen Reactions  . Wasp Venom Anaphylaxis and Hives  . Naproxen Other (See Comments)    Hematuria when taken in large doses  . Beeswax   . Tape Rash    ADHESIVE AND PAPER TAPE REMOVES SKIN    Family History  Problem Relation Age of Onset  . Alcohol abuse Mother   . Heart attack Father 73  . Asthma Brother   . Diabetes Brother     Social History   Socioeconomic History  . Marital status: Married    Spouse name: Not on  file  . Number of children: 2  . Years of education: 6  . Highest education level: Not on file  Occupational History  . Occupation: Administration  Social Needs  . Financial resource strain: Not on file  . Food insecurity    Worry: Not on file    Inability: Not on file  . Transportation needs    Medical: Not on file    Non-medical: Not on file  Tobacco Use  . Smoking status: Never Smoker  . Smokeless tobacco: Never Used  Substance and Sexual Activity  . Alcohol use: Not Currently  . Drug use: No  . Sexual activity: Not Currently    Birth control/protection: Inserts    Comment: Nuvaring  Lifestyle  . Physical activity    Days per week: Not on file    Minutes per session: Not on file  . Stress: Not on file  Relationships  . Social Herbalist on phone: Not on file    Gets together: Not on file    Attends religious service: Not on file    Active member of club or organization: Not on file    Attends meetings of clubs or organizations: Not on file    Relationship status: Not on file  Other Topics  Concern  . Not on file  Social History Narrative  . Not on file    Review of Systems - See HPI.  All other ROS are negative.  Wt 233 lb (105.7 kg)   BMI 38.77 kg/m   Physical Exam Vitals signs reviewed.  Constitutional:      Appearance: Normal appearance.  HENT:     Head: Normocephalic and atraumatic.  Neck:     Musculoskeletal: Neck supple.  Cardiovascular:     Rate and Rhythm: Normal rate and regular rhythm.     Pulses: Normal pulses.     Heart sounds: Normal heart sounds.  Pulmonary:     Effort: Pulmonary effort is normal.  Abdominal:     Palpations: Abdomen is soft.  Neurological:     Mental Status: She is alert.     Recent Results (from the past 2160 hour(s))  CBC w/Diff     Status: Abnormal   Collection Time: 01/26/19 11:49 AM  Result Value Ref Range   WBC 5.4 4.0 - 10.5 K/uL   RBC 4.92 3.87 - 5.11 Mil/uL   Hemoglobin 12.2 12.0 - 15.0  g/dL   HCT 37.9 36.0 - 46.0 %   MCV 77.0 (L) 78.0 - 100.0 fl   MCHC 32.2 30.0 - 36.0 g/dL   RDW 17.3 (H) 11.5 - 15.5 %   Platelets 358.0 150.0 - 400.0 K/uL   Neutrophils Relative % 45.0 43.0 - 77.0 %   Lymphocytes Relative 44.8 12.0 - 46.0 %   Monocytes Relative 5.9 3.0 - 12.0 %   Eosinophils Relative 4.0 0.0 - 5.0 %   Basophils Relative 0.3 0.0 - 3.0 %   Neutro Abs 2.4 1.4 - 7.7 K/uL   Lymphs Abs 2.4 0.7 - 4.0 K/uL   Monocytes Absolute 0.3 0.1 - 1.0 K/uL   Eosinophils Absolute 0.2 0.0 - 0.7 K/uL   Basophils Absolute 0.0 0.0 - 0.1 K/uL  TSH     Status: None   Collection Time: 01/26/19 11:49 AM  Result Value Ref Range   TSH 1.88 0.35 - 4.50 uIU/mL  Basic metabolic panel     Status: None   Collection Time: 01/26/19 11:49 AM  Result Value Ref Range   Sodium 138 135 - 145 mEq/L   Potassium 4.2 3.5 - 5.1 mEq/L   Chloride 104 96 - 112 mEq/L   CO2 28 19 - 32 mEq/L   Glucose, Bld 81 70 - 99 mg/dL   BUN 14 6 - 23 mg/dL   Creatinine, Ser 0.95 0.40 - 1.20 mg/dL   Calcium 9.2 8.4 - 10.5 mg/dL   GFR 78.79 >60.00 mL/min  IBC panel([facility])     Status: Abnormal   Collection Time: 01/27/19  3:34 PM  Result Value Ref Range   Iron 247 (H) 42 - 145 ug/dL   Transferrin 235.0 212.0 - 360.0 mg/dL   Saturation Ratios 75.1 (H) 20.0 - 50.0 %    Assessment/Plan: 1. Anxiety and depression Increase Sertraline to 50 mg QD. Start Alprazolam 0.25 mg PRN for acute anxiety. Counseling again recommended. Follow-up 3-4 weeks.  - sertraline (ZOLOFT) 50 MG tablet; Take 1 tablet (50 mg total) by mouth daily.  Dispense: 30 tablet; Refill: 3 - ALPRAZolam (XANAX) 0.25 MG tablet; Take 1 tablet (0.25 mg total) by mouth 2 (two) times daily as needed for anxiety.  Dispense: 15 tablet; Refill: 0   Leeanne Rio, PA-C

## 2019-02-23 ENCOUNTER — Ambulatory Visit: Payer: BC Managed Care – PPO | Admitting: Physician Assistant

## 2019-02-27 ENCOUNTER — Other Ambulatory Visit (INDEPENDENT_AMBULATORY_CARE_PROVIDER_SITE_OTHER): Payer: BC Managed Care – PPO

## 2019-02-27 DIAGNOSIS — R002 Palpitations: Secondary | ICD-10-CM | POA: Diagnosis not present

## 2019-03-10 DIAGNOSIS — R002 Palpitations: Secondary | ICD-10-CM | POA: Diagnosis not present

## 2019-03-16 ENCOUNTER — Other Ambulatory Visit: Payer: Self-pay | Admitting: Physician Assistant

## 2019-03-16 DIAGNOSIS — R002 Palpitations: Secondary | ICD-10-CM

## 2019-03-18 ENCOUNTER — Ambulatory Visit: Payer: BC Managed Care – PPO | Admitting: Physician Assistant

## 2019-03-18 ENCOUNTER — Encounter: Payer: Self-pay | Admitting: Physician Assistant

## 2019-03-18 ENCOUNTER — Other Ambulatory Visit: Payer: Self-pay

## 2019-03-18 VITALS — BP 120/70 | HR 64 | Temp 97.9°F | Resp 16 | Ht 65.0 in | Wt 230.0 lb

## 2019-03-18 DIAGNOSIS — F329 Major depressive disorder, single episode, unspecified: Secondary | ICD-10-CM

## 2019-03-18 DIAGNOSIS — Z862 Personal history of diseases of the blood and blood-forming organs and certain disorders involving the immune mechanism: Secondary | ICD-10-CM | POA: Diagnosis not present

## 2019-03-18 DIAGNOSIS — F419 Anxiety disorder, unspecified: Secondary | ICD-10-CM | POA: Diagnosis not present

## 2019-03-18 DIAGNOSIS — R002 Palpitations: Secondary | ICD-10-CM | POA: Diagnosis not present

## 2019-03-18 DIAGNOSIS — Z23 Encounter for immunization: Secondary | ICD-10-CM | POA: Diagnosis not present

## 2019-03-18 LAB — CBC WITH DIFFERENTIAL/PLATELET
Basophils Absolute: 0.1 10*3/uL (ref 0.0–0.1)
Basophils Relative: 1.3 % (ref 0.0–3.0)
Eosinophils Absolute: 0.2 10*3/uL (ref 0.0–0.7)
Eosinophils Relative: 3.8 % (ref 0.0–5.0)
HCT: 37.7 % (ref 36.0–46.0)
Hemoglobin: 12.2 g/dL (ref 12.0–15.0)
Lymphocytes Relative: 47.1 % — ABNORMAL HIGH (ref 12.0–46.0)
Lymphs Abs: 2.5 10*3/uL (ref 0.7–4.0)
MCHC: 32.2 g/dL (ref 30.0–36.0)
MCV: 77.5 fl — ABNORMAL LOW (ref 78.0–100.0)
Monocytes Absolute: 0.3 10*3/uL (ref 0.1–1.0)
Monocytes Relative: 6.3 % (ref 3.0–12.0)
Neutro Abs: 2.2 10*3/uL (ref 1.4–7.7)
Neutrophils Relative %: 41.5 % — ABNORMAL LOW (ref 43.0–77.0)
Platelets: 345 10*3/uL (ref 150.0–400.0)
RBC: 4.87 Mil/uL (ref 3.87–5.11)
RDW: 16.4 % — ABNORMAL HIGH (ref 11.5–15.5)
WBC: 5.3 10*3/uL (ref 4.0–10.5)

## 2019-03-18 LAB — IBC PANEL
Iron: 57 ug/dL (ref 42–145)
Saturation Ratios: 14.9 % — ABNORMAL LOW (ref 20.0–50.0)
Transferrin: 274 mg/dL (ref 212.0–360.0)

## 2019-03-18 MED ORDER — SERTRALINE HCL 100 MG PO TABS: 100.0000 mg | ORAL_TABLET | Freq: Every day | ORAL | 3 refills | Status: DC

## 2019-03-18 NOTE — Progress Notes (Signed)
Patient presents to clinic today c/o anxiety follow-up. At time of last visit Sertraline was increased from 25 mg to 50 mg QD, added Alprazolam 0.25 mg BID as needed. Tolerating increase in sertraline well with improvement in overall mood.  States still having difficult days. Endorses lightheadedness, dizziness, shakiness, heart palpitations. Just turned in her holter monitor. I have not received results for this yet.  Notes feeling exhausted, fatigued with alternating episodes of racing thoughts. Denies manic behavior. Notes she tries to force herself to do things. Sleep is increased in duration but not always restful sleep.   Past Medical History:  Diagnosis Date  . Allergy    Seasonal  . Asthma   . Bee sting allergy   . History of chickenpox   . Pneumonia 08/2017   RESOLVED    Current Outpatient Medications on File Prior to Visit  Medication Sig Dispense Refill  . albuterol (PROVENTIL HFA;VENTOLIN HFA) 108 (90 Base) MCG/ACT inhaler Inhale 2 puffs into the lungs as needed.    . Ascorbic Acid (VITAMIN C) 1000 MG tablet Take 1,000 mg by mouth daily.    . Biotin 1 MG CAPS Take by mouth.    . Calcium-Magnesium-Vitamin D (CALCIUM 1200+D3 PO) Take 1 tablet by mouth daily.    . Cyanocobalamin (B-12) 500 MCG TABS Take by mouth.    . EPINEPHrine 0.3 mg/0.3 mL IJ SOAJ injection epinephrine 0.3 mg/0.3 mL injection, auto-injector    . Ferrous Sulfate (IRON PO) Take by mouth.    . Multiple Vitamin (MULTI-VITAMIN PO) Take by mouth.     No current facility-administered medications on file prior to visit.     Allergies  Allergen Reactions  . Wasp Venom Anaphylaxis and Hives  . Naproxen Other (See Comments)    Hematuria when taken in large doses  . Beeswax   . Tape Rash    ADHESIVE AND PAPER TAPE REMOVES SKIN    Family History  Problem Relation Age of Onset  . Alcohol abuse Mother   . Heart attack Father 51  . Asthma Brother   . Diabetes Brother     Social History   Socioeconomic  History  . Marital status: Married    Spouse name: Not on file  . Number of children: 2  . Years of education: 28  . Highest education level: Not on file  Occupational History  . Occupation: Administration  Social Needs  . Financial resource strain: Not on file  . Food insecurity    Worry: Not on file    Inability: Not on file  . Transportation needs    Medical: Not on file    Non-medical: Not on file  Tobacco Use  . Smoking status: Never Smoker  . Smokeless tobacco: Never Used  Substance and Sexual Activity  . Alcohol use: Not Currently  . Drug use: No  . Sexual activity: Not Currently    Birth control/protection: Inserts    Comment: Nuvaring  Lifestyle  . Physical activity    Days per week: Not on file    Minutes per session: Not on file  . Stress: Not on file  Relationships  . Social Herbalist on phone: Not on file    Gets together: Not on file    Attends religious service: Not on file    Active member of club or organization: Not on file    Attends meetings of clubs or organizations: Not on file    Relationship status: Not on file  Other Topics Concern  . Not on file  Social History Narrative  . Not on file   Review of Systems - See HPI.  All other ROS are negative.  BP 120/70   Pulse 64   Temp 97.9 F (36.6 C) (Temporal)   Resp 16   Ht 5\' 5"  (1.651 m)   Wt 230 lb (104.3 kg)   SpO2 98%   BMI 38.27 kg/m   Physical Exam Vitals signs reviewed.  Constitutional:      Appearance: Normal appearance.  HENT:     Head: Normocephalic and atraumatic.  Eyes:     Conjunctiva/sclera: Conjunctivae normal.  Neck:     Musculoskeletal: Neck supple.  Cardiovascular:     Rate and Rhythm: Normal rate and regular rhythm.     Pulses: Normal pulses.     Heart sounds: Normal heart sounds.  Neurological:     General: No focal deficit present.     Mental Status: She is alert and oriented to person, place, and time.  Psychiatric:        Mood and Affect:  Mood normal.     Recent Results (from the past 2160 hour(s))  CBC w/Diff     Status: Abnormal   Collection Time: 01/26/19 11:49 AM  Result Value Ref Range   WBC 5.4 4.0 - 10.5 K/uL   RBC 4.92 3.87 - 5.11 Mil/uL   Hemoglobin 12.2 12.0 - 15.0 g/dL   HCT 37.9 36.0 - 46.0 %   MCV 77.0 (L) 78.0 - 100.0 fl   MCHC 32.2 30.0 - 36.0 g/dL   RDW 17.3 (H) 11.5 - 15.5 %   Platelets 358.0 150.0 - 400.0 K/uL   Neutrophils Relative % 45.0 43.0 - 77.0 %   Lymphocytes Relative 44.8 12.0 - 46.0 %   Monocytes Relative 5.9 3.0 - 12.0 %   Eosinophils Relative 4.0 0.0 - 5.0 %   Basophils Relative 0.3 0.0 - 3.0 %   Neutro Abs 2.4 1.4 - 7.7 K/uL   Lymphs Abs 2.4 0.7 - 4.0 K/uL   Monocytes Absolute 0.3 0.1 - 1.0 K/uL   Eosinophils Absolute 0.2 0.0 - 0.7 K/uL   Basophils Absolute 0.0 0.0 - 0.1 K/uL  TSH     Status: None   Collection Time: 01/26/19 11:49 AM  Result Value Ref Range   TSH 1.88 0.35 - 4.50 uIU/mL  Basic metabolic panel     Status: None   Collection Time: 01/26/19 11:49 AM  Result Value Ref Range   Sodium 138 135 - 145 mEq/L   Potassium 4.2 3.5 - 5.1 mEq/L   Chloride 104 96 - 112 mEq/L   CO2 28 19 - 32 mEq/L   Glucose, Bld 81 70 - 99 mg/dL   BUN 14 6 - 23 mg/dL   Creatinine, Ser 0.95 0.40 - 1.20 mg/dL   Calcium 9.2 8.4 - 10.5 mg/dL   GFR 78.79 >60.00 mL/min  IBC panel([facility])     Status: Abnormal   Collection Time: 01/27/19  3:34 PM  Result Value Ref Range   Iron 247 (H) 42 - 145 ug/dL   Transferrin 235.0 212.0 - 360.0 mg/dL   Saturation Ratios 75.1 (H) 20.0 - 50.0 %   Assessment/Plan: 1. Anxiety and depression Increase Sertraline to 100 mg daily. Counseling again recommended. She will give thought to this. Awaiting holter study results -- will likely be adding BB to regimen which would also help with anxiety. Sleep hygiene practices reviewed.  - sertraline (ZOLOFT) 100 MG  tablet; Take 1 tablet (100 mg total) by mouth daily.  Dispense: 30 tablet; Refill: 3  2. Palpitations No  final read on her Holter monitor as of yet. See one episode of SVT lasting 4-5 beats. Would consider BB for symptomatic SVT/palpitations as long as no other arrhthymias are noted. Will message EP provider.   3. History of anemia Due for recheck today.  - IBC panel - CBC with Differential/Platelet  4. Need for immunization against influenza - Flu Vaccine QUAD 36+ mos IM  Leeanne Rio, PA-C

## 2019-03-18 NOTE — Patient Instructions (Addendum)
Please increase the Sertraline the new dose at 100 mg.  I am working on getting a report from the Cardiologist so we can start additional medication for the palpitations.   I will call you as soon as I have this.  Please go to the lab today for blood work.  I will call you with your results. We will alter treatment regimen(s) if indicated by your results.

## 2019-03-19 ENCOUNTER — Other Ambulatory Visit: Payer: Self-pay | Admitting: Emergency Medicine

## 2019-03-19 DIAGNOSIS — Z862 Personal history of diseases of the blood and blood-forming organs and certain disorders involving the immune mechanism: Secondary | ICD-10-CM

## 2019-03-19 MED ORDER — TAB-A-VITE/IRON PO TABS: 1.0000 | ORAL_TABLET | Freq: Every day | ORAL | 0 refills | Status: DC

## 2019-03-20 ENCOUNTER — Other Ambulatory Visit: Payer: Self-pay | Admitting: Emergency Medicine

## 2019-03-20 ENCOUNTER — Other Ambulatory Visit: Payer: Self-pay | Admitting: Physician Assistant

## 2019-03-20 ENCOUNTER — Encounter: Payer: Self-pay | Admitting: Physician Assistant

## 2019-03-20 MED ORDER — METOPROLOL TARTRATE 25 MG PO TABS: 12.5000 mg | ORAL_TABLET | Freq: Two times a day (BID) | ORAL | 1 refills | Status: DC

## 2019-04-06 ENCOUNTER — Encounter: Payer: Self-pay | Admitting: Physician Assistant

## 2019-04-08 DIAGNOSIS — Z113 Encounter for screening for infections with a predominantly sexual mode of transmission: Secondary | ICD-10-CM | POA: Diagnosis not present

## 2019-04-08 DIAGNOSIS — N920 Excessive and frequent menstruation with regular cycle: Secondary | ICD-10-CM | POA: Diagnosis not present

## 2019-04-08 DIAGNOSIS — N898 Other specified noninflammatory disorders of vagina: Secondary | ICD-10-CM | POA: Diagnosis not present

## 2019-04-23 ENCOUNTER — Telehealth: Payer: Self-pay | Admitting: Emergency Medicine

## 2019-04-23 DIAGNOSIS — R002 Palpitations: Secondary | ICD-10-CM

## 2019-04-23 MED ORDER — ALBUTEROL SULFATE HFA 108 (90 BASE) MCG/ACT IN AERS: 2.0000 | INHALATION_SPRAY | RESPIRATORY_TRACT | 1 refills | Status: DC | PRN

## 2019-04-23 MED ORDER — METOPROLOL TARTRATE 25 MG PO TABS: 25.0000 mg | ORAL_TABLET | Freq: Two times a day (BID) | ORAL | 1 refills | Status: DC

## 2019-04-23 NOTE — Telephone Encounter (Signed)
Patient called today On 04/08/19 advised PCP of continued palpitations. Metoprolol increased to 1 tablet bid Her smart watch checking her heart rate continuous check the last few days Today feeling asthmatic-refilled albuterol inhaler Today her heart rate running 38-106 Yesterday heart rate was 46-128 She denies any back pain, sob. Does become a little winded.   Per my chart message that if heart rate drops below 50 will need to change medications or have a Cardiology assessment.   Patient haven't taken her 9 pm dose of Metoprolol. Advised patient not to take this evening, to pick up inhaler as needed for winded.  If sxs worsens or start having chest pains to go to the ER. Patient is agreeable

## 2019-04-24 NOTE — Telephone Encounter (Signed)
Cut back metoprolol to half a tablet twice daily.  Please place referral to cardiology for symptomatic palpitations.

## 2019-04-24 NOTE — Telephone Encounter (Signed)
Advised patient of PCP recommendation to decrease Metoprolol to 12.5 bid And referral placed for Cardiology. Patient is agreeable.

## 2019-04-30 DIAGNOSIS — Z20828 Contact with and (suspected) exposure to other viral communicable diseases: Secondary | ICD-10-CM | POA: Diagnosis not present

## 2019-05-12 ENCOUNTER — Other Ambulatory Visit: Payer: Self-pay | Admitting: Emergency Medicine

## 2019-05-12 DIAGNOSIS — R002 Palpitations: Secondary | ICD-10-CM

## 2019-05-12 MED ORDER — METOPROLOL TARTRATE 25 MG PO TABS: 12.5000 mg | ORAL_TABLET | Freq: Two times a day (BID) | ORAL | 3 refills | Status: DC

## 2019-05-13 ENCOUNTER — Encounter: Payer: Self-pay | Admitting: Cardiology

## 2019-05-13 ENCOUNTER — Other Ambulatory Visit: Payer: Self-pay

## 2019-05-13 ENCOUNTER — Ambulatory Visit: Payer: BC Managed Care – PPO | Admitting: Cardiology

## 2019-05-13 VITALS — BP 124/89 | HR 67 | Temp 96.8°F | Ht 65.0 in | Wt 230.8 lb

## 2019-05-13 DIAGNOSIS — I491 Atrial premature depolarization: Secondary | ICD-10-CM | POA: Diagnosis not present

## 2019-05-13 DIAGNOSIS — I471 Supraventricular tachycardia: Secondary | ICD-10-CM | POA: Diagnosis not present

## 2019-05-13 DIAGNOSIS — I493 Ventricular premature depolarization: Secondary | ICD-10-CM

## 2019-05-13 DIAGNOSIS — R002 Palpitations: Secondary | ICD-10-CM | POA: Insufficient documentation

## 2019-05-13 DIAGNOSIS — Z7189 Other specified counseling: Secondary | ICD-10-CM

## 2019-05-13 DIAGNOSIS — Z712 Person consulting for explanation of examination or test findings: Secondary | ICD-10-CM

## 2019-05-13 NOTE — Patient Instructions (Addendum)
Medication Instructions:  Stop Metoprolol 25 mg twice a day  *If you need a refill on your cardiac medications before your next appointment, please call your pharmacy*  Lab Work: None  Testing/Procedures: None  Follow-Up: At [facility], you and your health needs are our priority.  As part of our continuing mission to provide you with exceptional heart care, we have created designated Provider Care Teams.  These Care Teams include your primary Cardiologist (physician) and Advanced Practice Providers (APPs -  Physician Assistants and Nurse Practitioners) who all work together to provide you with the care you need, when you need it.  Your next appointment:   As needed  The format for your next appointment:   Either In Person or Virtual  Provider:   Buford Dresser, MD  Other Instructions If interested, consider [facility].

## 2019-05-13 NOTE — Progress Notes (Signed)
Cardiology Office Note:    Date:  05/13/2019   ID:  Zoe Soto, DOB 1980-08-08, MRN [id number]  PCP:  Brunetta Jeans, PA-C  Cardiologist:  Buford Dresser, MD  Referring MD: Brunetta Jeans, PA-C   CC: new patient evaluation for palpitations, with PACs, PVCs, and paroxysmal brief SVT  History of Present Illness:    Zoe Soto is a 39 y.o. female with a hx of anxiety who is seen as a new consult at the request of Zoe Soto for the evaluation and management of palpitations.  Reviewed most recent notes from [facility], [place]. Her anxiety medications have been titrated, and she Soto ordered for a Holter monitor. Results reviewed below, notable for only 4 beat run of SVT, occasional PACs and rare PVCs. She Soto started on PRN metoprolol for this.  Tachycardia/palpitations: -Initial onset: First told she had an irregular heart beat when she Soto 39 years old. Had a physical for college, found to have irregular heart beat. Soto asymptomatic at the time. No symptoms until around 08-04-03, when she started also have anxiety attacks. Leveled off again until around 08/03/16, when she started having dizziness, sweating, shoulder pain/pressure with the events. Worsened when she Soto pregnant last year, felt heart pounding/flutters became severe. Had nausea with these.  -Frequency/Duration: happening every day, but very brief. Uptitrated metoprolol, but now has resting bradycardia.  -Associated symptoms: unrelated but frequent bilateral shoulder pain. -Aggravating/alleviating factors: not better on metoprolol, actually feels worse/more tired -Syncope/near syncope: none -Prior cardiac history: none other above -Prior ECG:  -Prior workup: holter as below -Prior treatment: metoprolol -Possible medication interactions: -Caffeine: 1 cup of coffee/day -Alcohol: 1 glass wine/once a week -Tobacco: smoker in college occasionally, none in a long time -OTC supplements: none with stimulants.  -Comorbidities: none -Exercise level: used to ride bikes, walk until weather got cold. -Labs: TSH, kidney function/electrolytes, CBC reviewed. History of microcytic anemia, stable to improved on recent blood work -Cardiac ROS: no chest pain, no shortness of breath, no PND, no orthopnea, no LE edema.  -Family history:  father had an MI, passed away age 31. He Soto adopted, no other family history known on his side.   MGF had pacemaker. MGM without heart issues (passed from cancer). Maternal great aunt with unclear heart issues. 2 brothers, both with diabetes, one deceased from heart failure and other organ failure, passed away age 39 in March 06, 2019. Had been ill most of his life, Soto paralyzed/in a wheelchair, had frequent hospitalisations. Other brother has SVT also. Two twin siblings died shortly after birth. Two half-sisters, both without heart issues. Son passed away 08/04/18 (Soto three months old) when his heart stopped. Had pulmonary atresia. Has a daughter who is healthy. Has two cousins with history of heart conditions (?artery outside the heart that had to be burned).   Past Medical History:  Diagnosis Date  . Allergy    Seasonal  . Asthma   . Bee sting allergy   . History of chickenpox   . Pneumonia 08/2017   RESOLVED    Past Surgical History:  Procedure Laterality Date  . ANKLE SURGERY Right 2009-08-03  . DILITATION & CURRETTAGE/HYSTROSCOPY WITH NOVASURE ABLATION N/A 06/27/2018   Procedure: DILATATION & CURETTAGE/HYSTEROSCOPY WITH NOVASURE ABLATION;  Surgeon: Tyson Dense, MD;  Location: [facility];  Service: Gynecology;  Laterality: N/A;  . GASTRIC BYPASS  August 04, 2015  . KNEE ARTHROSCOPY Left August 03, 2008  . LAPAROSCOPIC TUBAL LIGATION Bilateral 06/27/2018   Procedure: LAPAROSCOPIC TUBAL LIGATION;  Surgeon: Tyson Dense, MD;  Location: [facility];  Service: Gynecology;  Laterality: Bilateral;  . SMALL BOWEL REPAIR N/A 06/27/2018   Procedure: SMALL  BOWEL REPAIR;  Surgeon: Tyson Dense, MD;  Location: [facility];  Service: Gynecology;  Laterality: N/A;    Current Medications: Current Outpatient Medications on File Prior to Visit  Medication Sig  . albuterol (VENTOLIN HFA) 108 (90 Base) MCG/ACT inhaler Inhale 2 puffs into the lungs as needed.  . ALPRAZolam (XANAX) 0.25 MG tablet Take 0.25 mg by mouth. As needed for anxiety attacks  . Ascorbic Acid (VITAMIN C) 1000 MG tablet Take 1,000 mg by mouth daily.  . Biotin 1 MG CAPS Take by mouth.  . Calcium Carbonate-Vitamin D 600-200 MG-UNIT TABS Take by mouth.  . Calcium-Magnesium-Vitamin D (CALCIUM 1200+D3 PO) Take 1 tablet by mouth daily.  . Cyanocobalamin (B-12) 500 MCG TABS Take by mouth.  . EPINEPHrine 0.3 mg/0.3 mL IJ SOAJ injection epinephrine 0.3 mg/0.3 mL injection, auto-injector  . Ferrous Sulfate (IRON PO) Take by mouth.  . metoprolol tartrate (LOPRESSOR) 25 MG tablet Take 0.5 tablets (12.5 mg total) by mouth 2 (two) times daily.  . Multiple Vitamin (MULTI-VITAMIN PO) Take by mouth.  . Multiple Vitamins-Iron (MULTIVITAMINS WITH IRON) TABS tablet Take 1 tablet by mouth daily.  . sertraline (ZOLOFT) 100 MG tablet Take 1 tablet (100 mg total) by mouth daily.   No current facility-administered medications on file prior to visit.     Allergies:   Wasp venom, Naproxen, Beeswax, Other, and Tape   Social History   Tobacco Use  . Smoking status: Never Smoker  . Smokeless tobacco: Never Used  Substance Use Topics  . Alcohol use: Not Currently  . Drug use: No    Family History: family history includes Alcohol abuse in her mother; Asthma in her brother; Diabetes in her brother; Heart attack (age of onset: 86) in her father.  ROS:   Please see the history of present illness.  Additional pertinent ROS: Constitutional: Negative for chills, fever, night sweats, unintentional weight loss  HENT: Negative for ear pain and hearing loss.   Eyes: Negative for loss  of vision and eye pain.  Respiratory: Negative for cough, sputum, wheezing.   Cardiovascular: See HPI. Gastrointestinal: Negative for abdominal pain, melena, and hematochezia.  Genitourinary: Negative for dysuria and hematuria.  Musculoskeletal: Negative for falls and myalgias.  Skin: Negative for itching and rash.  Neurological: Negative for focal weakness, focal sensory changes and loss of consciousness.  Endo/Heme/Allergies: Does not bruise/bleed easily.     EKGs/Labs/Other Studies Reviewed:    The following studies were reviewed today: Monitor 03/19/19  Normal sinus rhythm  PAC's and brief SVT correlate with symptoms  Longest SVT 4 beats  Rare PVC's  No atrial fibrillation or VT  EKG:  EKG is personally reviewed.  The ekg ordered today demonstrates sinus bradycardia at 57 bpm, borderline normal axis.  Recent Labs: 01/26/2019: BUN 14; Creatinine, Ser 0.95; Potassium 4.2; Sodium 138; TSH 1.88 03/18/2019: Hemoglobin 12.2; Platelets 345.0  Recent Lipid Panel No results found for: CHOL, TRIG, HDL, CHOLHDL, VLDL, LDLCALC, LDLDIRECT  Physical Exam:    VS:  BP 124/89   Pulse 67   Temp (!) 96.8 F (36 C)   Ht 5\' 5"  (1.651 m)   Wt 230 lb 12.8 oz (104.7 kg)   SpO2 98%   BMI 38.41 kg/m     Wt Readings from Last 3 Encounters:  05/13/19 230 lb 12.8  oz (104.7 kg)  03/18/19 230 lb (104.3 kg)  02/18/19 233 lb (105.7 kg)    GEN: Well nourished, well developed in no acute distress HEENT: Normal, moist mucous membranes NECK: No JVD CARDIAC: regular rhythm, normal S1 and S2, no rubs or gallops. No murmurs. VASCULAR: Radial and DP pulses 2+ bilaterally. No carotid bruits RESPIRATORY:  Clear to auscultation without rales, wheezing or rhonchi  ABDOMEN: Soft, non-tender, non-distended MUSCULOSKELETAL:  Ambulates independently SKIN: Warm and dry, no edema NEUROLOGIC:  Alert and oriented x 3. No focal neuro deficits noted. PSYCHIATRIC:  Normal affect    ASSESSMENT:    1.  Heart palpitations   2. Paroxysmal SVT (supraventricular tachycardia) ([place])   3. PAC (premature atrial contraction)   4. PVC (premature ventricular contraction)   5. Encounter to discuss test results   6. Cardiac risk counseling   7. Counseling on health promotion and disease prevention    PLAN:    Palpitations, with paroxysmal SVT, PACs, and PVCs seen on monitor: Discussed at length today. We reviewed the actual strips from her monitor report together. -these events are very brief, and she feels worse on the metoprolol. We discussed options, including changing medical management vs. Monitoring symptoms. She would like to stop the metoprolol and monitor her symptoms -if symptoms worse, she will call me, and we will discuss alternative treatment options -counseled on red flag warning signs that need immediate medical attention.  Cardiac risk counseling and prevention recommendations: -recommend heart healthy/Mediterranean diet, with whole grains, fruits, vegetable, fish, lean meats, nuts, and olive oil. Limit salt. -recommend moderate walking, 3-5 times/week for 30-50 minutes each session. Aim for at least 150 minutes.week. Goal should be pace of 3 miles/hours, or walking 1.5 miles in 30 minutes -recommend avoidance of tobacco products. Avoid excess alcohol. -Additional risk factor control:  -Diabetes risk: A1c is not available, denies history  -Lipids: not available  -Blood pressure control: improved on recheck  -Weight: BMI 38, would benefit from weight loss -ASCVD risk score: would draw lipids at next PCP visit for screening  Plan for follow up: she is welcome to call me if symptoms worse, and I would be happy to see her as needed.  Total time of encounter: 62 minutes total time of encounter, including 42 minutes spent in face-to-face patient care. This time includes coordination of care and counseling regarding test results, discussion of findings, options for management. Remainder  of non-face-to-face time involved reviewing chart documents/testing relevant to the patient encounter (including reviewing the monitor ordered by another provider) and documentation in the medical record.  Buford Dresser, MD, PhD [place]  [facility]    Medication Adjustments/Labs and Tests Ordered: Current medicines are reviewed at length with the patient today.  Concerns regarding medicines are outlined above.  Orders Placed This Encounter  Procedures  . EKG 12-Lead   No orders of the defined types were placed in this encounter.   Patient Instructions  Medication Instructions:  Stop Metoprolol 25 mg twice a day  *If you need a refill on your cardiac medications before your next appointment, please call your pharmacy*  Lab Work: None  Testing/Procedures: None  Follow-Up: At [facility], you and your health needs are our priority.  As part of our continuing mission to provide you with exceptional heart care, we have created designated Provider Care Teams.  These Care Teams include your primary Cardiologist (physician) and Advanced Practice Providers (APPs -  Physician Assistants and Nurse Practitioners) who all work together to  provide you with the care you need, when you need it.  Your next appointment:   As needed  The format for your next appointment:   Either In Person or Virtual  Provider:   Buford Dresser, MD  Other Instructions If interested, consider [facility].   Signed, Buford Dresser, MD PhD 05/13/2019     [place]

## 2019-06-08 DIAGNOSIS — M5411 Radiculopathy, occipito-atlanto-axial region: Secondary | ICD-10-CM | POA: Diagnosis not present

## 2019-06-08 DIAGNOSIS — M9901 Segmental and somatic dysfunction of cervical region: Secondary | ICD-10-CM | POA: Diagnosis not present

## 2019-06-09 DIAGNOSIS — Z03818 Encounter for observation for suspected exposure to other biological agents ruled out: Secondary | ICD-10-CM | POA: Diagnosis not present

## 2019-06-09 DIAGNOSIS — M9901 Segmental and somatic dysfunction of cervical region: Secondary | ICD-10-CM | POA: Diagnosis not present

## 2019-06-09 DIAGNOSIS — M5411 Radiculopathy, occipito-atlanto-axial region: Secondary | ICD-10-CM | POA: Diagnosis not present

## 2019-06-09 DIAGNOSIS — Z20828 Contact with and (suspected) exposure to other viral communicable diseases: Secondary | ICD-10-CM | POA: Diagnosis not present

## 2019-06-10 ENCOUNTER — Ambulatory Visit (INDEPENDENT_AMBULATORY_CARE_PROVIDER_SITE_OTHER): Payer: BC Managed Care – PPO | Admitting: Physician Assistant

## 2019-06-10 ENCOUNTER — Encounter: Payer: Self-pay | Admitting: Physician Assistant

## 2019-06-10 ENCOUNTER — Other Ambulatory Visit: Payer: Self-pay

## 2019-06-10 DIAGNOSIS — B9689 Other specified bacterial agents as the cause of diseases classified elsewhere: Secondary | ICD-10-CM | POA: Diagnosis not present

## 2019-06-10 DIAGNOSIS — J019 Acute sinusitis, unspecified: Secondary | ICD-10-CM | POA: Diagnosis not present

## 2019-06-10 MED ORDER — AMOXICILLIN-POT CLAVULANATE 875-125 MG PO TABS: 1.0000 | ORAL_TABLET | Freq: Two times a day (BID) | ORAL | 0 refills | Status: DC

## 2019-06-10 NOTE — Progress Notes (Signed)
Virtual Visit via Video   I connected with patient on 06/10/19 at  4:00 PM EST by a video enabled telemedicine application and verified that I am speaking with the correct person using two identifiers.  Location patient: Home Location provider: [facility], Office Persons participating in the virtual visit: Patient, Provider, [place] (Patina Moore)  I discussed the limitations of evaluation and management by telemedicine and the availability of in person appointments. The patient expressed understanding and agreed to proceed.  Subjective:   HPI:   Patient presents via doxy.May complaining of 1 week of nasal congestion, headache, sinus pressure and sinus pain.  Patient denies any fever, aches, chills, chest congestion or cough. Denies loss of taste or smell.  Denies recent travel or sick contact. Notes she had a rapid test for Covid yesterday which was negative.  ROS:   See pertinent positives and negatives per HPI.  Patient Active Problem List   Diagnosis Date Noted  . Heart palpitations 05/13/2019  . Paroxysmal SVT (supraventricular tachycardia) ([place]) 05/13/2019  . PAC (premature atrial contraction) 05/13/2019  . PVC (premature ventricular contraction) 05/13/2019  . H/O laparoscopy 06/27/2018  . Anemia 06/04/2018  . Herpes simplex virus type 2 (HSV-2) infection affecting pregnancy in third trimester 02/21/2018  . Acute bacterial sinusitis 02/21/2017  . Intramural leiomyoma of uterus 05/26/2013  . Irregular menstruation 08/04/2010    Social History   Tobacco Use  . Smoking status: Never Smoker  . Smokeless tobacco: Never Used  Substance Use Topics  . Alcohol use: Not Currently    Current Outpatient Medications:  .  albuterol (VENTOLIN HFA) 108 (90 Base) MCG/ACT inhaler, Inhale 2 puffs into the lungs as needed., Disp: 18 g, Rfl: 1 .  ALPRAZolam (XANAX) 0.25 MG tablet, Take 0.25 mg by mouth. As needed for anxiety attacks, Disp: , Rfl:  .  Ascorbic Acid (VITAMIN C)  1000 MG tablet, Take 1,000 mg by mouth daily., Disp: , Rfl:  .  Biotin 1 MG CAPS, Take by mouth., Disp: , Rfl:  .  Calcium Carbonate-Vitamin D 600-200 MG-UNIT TABS, Take by mouth., Disp: , Rfl:  .  Calcium-Magnesium-Vitamin D (CALCIUM 1200+D3 PO), Take 1 tablet by mouth daily., Disp: , Rfl:  .  Cyanocobalamin (B-12) 500 MCG TABS, Take by mouth., Disp: , Rfl:  .  EPINEPHrine 0.3 mg/0.3 mL IJ SOAJ injection, epinephrine 0.3 mg/0.3 mL injection, auto-injector, Disp: , Rfl:  .  Ferrous Sulfate (IRON PO), Take by mouth., Disp: , Rfl:  .  Multiple Vitamin (MULTI-VITAMIN PO), Take by mouth., Disp: , Rfl:  .  Multiple Vitamins-Iron (MULTIVITAMINS WITH IRON) TABS tablet, Take 1 tablet by mouth daily., Disp: 30 tablet, Rfl: 0 .  sertraline (ZOLOFT) 100 MG tablet, Take 1 tablet (100 mg total) by mouth daily., Disp: 30 tablet, Rfl: 3  Allergies  Allergen Reactions  . Wasp Venom Anaphylaxis and Hives  . Naproxen Other (See Comments)    Hematuria when taken in large doses  . Beeswax   . Other     Ants  . Tape Rash    ADHESIVE AND PAPER TAPE REMOVES SKIN    Objective:   There were no vitals taken for this visit.  Patient is well-developed, well-nourished in no acute distress.  Resting comfortably at home.  Head is normocephalic, atraumatic.  No labored breathing.  Speech is clear and coherent with logical content.  Patient is alert and oriented at baseline.   Assessment and Plan:   1. Ac.ute bacterial sinusitis Rx Augmentin.  Increase fluids.  Rest.  Saline nasal spray.  Probiotic.  Mucinex as directed.  Humidifier in bedroom.  Call or return to clinic if symptoms are not improving.     Leeanne Rio, [place] 06/10/2019

## 2019-06-10 NOTE — Patient Instructions (Signed)
Please take antibiotic as directed.  Increase fluid intake.  Use Saline nasal spray.  Take a daily multivitamin.  Place a humidifier in the bedroom.  Please call or return clinic if symptoms are not improving.  Sinusitis Sinusitis is redness, soreness, and swelling (inflammation) of the paranasal sinuses. Paranasal sinuses are air pockets within the bones of your face (beneath the eyes, the middle of the forehead, or above the eyes). In healthy paranasal sinuses, mucus is able to drain out, and air is able to circulate through them by way of your nose. However, when your paranasal sinuses are inflamed, mucus and air can become trapped. This can allow bacteria and other germs to grow and cause infection. Sinusitis can develop quickly and last only a short time (acute) or continue over a long period (chronic). Sinusitis that lasts for more than 12 weeks is considered chronic.  CAUSES  Causes of sinusitis include:  Allergies.  Structural abnormalities, such as displacement of the cartilage that separates your nostrils (deviated septum), which can decrease the air flow through your nose and sinuses and affect sinus drainage.  Functional abnormalities, such as when the small hairs (cilia) that line your sinuses and help remove mucus do not work properly or are not present. SYMPTOMS  Symptoms of acute and chronic sinusitis are the same. The primary symptoms are pain and pressure around the affected sinuses. Other symptoms include:  Upper toothache.  Earache.  Headache.  Bad breath.  Decreased sense of smell and taste.  A cough, which worsens when you are lying flat.  Fatigue.  Fever.  Thick drainage from your nose, which often is green and may contain pus (purulent).  Swelling and warmth over the affected sinuses. DIAGNOSIS  Your caregiver will perform a physical exam. During the exam, your caregiver may:  Look in your nose for signs of abnormal growths in your nostrils (nasal  polyps).  Tap over the affected sinus to check for signs of infection.  View the inside of your sinuses (endoscopy) with a special imaging device with a light attached (endoscope), which is inserted into your sinuses. If your caregiver suspects that you have chronic sinusitis, one or more of the following tests may be recommended:  Allergy tests.  Nasal culture A sample of mucus is taken from your nose and sent to a lab and screened for bacteria.  Nasal cytology A sample of mucus is taken from your nose and examined by your caregiver to determine if your sinusitis is related to an allergy. TREATMENT  Most cases of acute sinusitis are related to a viral infection and will resolve on their own within 10 days. Sometimes medicines are prescribed to help relieve symptoms (pain medicine, decongestants, nasal steroid sprays, or saline sprays).  However, for sinusitis related to a bacterial infection, your caregiver will prescribe antibiotic medicines. These are medicines that will help kill the bacteria causing the infection.  Rarely, sinusitis is caused by a fungal infection. In theses cases, your caregiver will prescribe antifungal medicine. For some cases of chronic sinusitis, surgery is needed. Generally, these are cases in which sinusitis recurs more than 3 times per year, despite other treatments. HOME CARE INSTRUCTIONS   Drink plenty of water. Water helps thin the mucus so your sinuses can drain more easily.  Use a humidifier.  Inhale steam 3 to 4 times a day (for example, sit in the bathroom with the shower running).  Apply a warm, moist washcloth to your face 3 to 4 times a day,  or as directed by your caregiver.  Use saline nasal sprays to help moisten and clean your sinuses.  Take over-the-counter or prescription medicines for pain, discomfort, or fever only as directed by your caregiver. SEEK IMMEDIATE MEDICAL CARE IF:  You have increasing pain or severe headaches.  You have  nausea, vomiting, or drowsiness.  You have swelling around your face.  You have vision problems.  You have a stiff neck.  You have difficulty breathing. MAKE SURE YOU:   Understand these instructions.  Will watch your condition.  Will get help right away if you are not doing well or get worse. Document Released: 04/23/2005 Document Revised: 07/16/2011 Document Reviewed: 05/08/2011 [facility] Patient Information 2014 [place], [place].

## 2019-06-10 NOTE — Progress Notes (Signed)
I have discussed the procedure for the virtual visit with the patient who has given consent to proceed with assessment and treatment.   Annika Selke S Danell Verno, CMA     

## 2019-06-11 DIAGNOSIS — M5411 Radiculopathy, occipito-atlanto-axial region: Secondary | ICD-10-CM | POA: Diagnosis not present

## 2019-06-11 DIAGNOSIS — M9901 Segmental and somatic dysfunction of cervical region: Secondary | ICD-10-CM | POA: Diagnosis not present

## 2019-06-23 DIAGNOSIS — Z1231 Encounter for screening mammogram for malignant neoplasm of breast: Secondary | ICD-10-CM | POA: Diagnosis not present

## 2019-06-23 DIAGNOSIS — Z01419 Encounter for gynecological examination (general) (routine) without abnormal findings: Secondary | ICD-10-CM | POA: Diagnosis not present

## 2019-06-23 DIAGNOSIS — Z6838 Body mass index (BMI) 38.0-38.9, adult: Secondary | ICD-10-CM | POA: Diagnosis not present

## 2019-06-23 DIAGNOSIS — Z1151 Encounter for screening for human papillomavirus (HPV): Secondary | ICD-10-CM | POA: Diagnosis not present

## 2019-10-12 DIAGNOSIS — F411 Generalized anxiety disorder: Secondary | ICD-10-CM | POA: Diagnosis not present

## 2019-10-16 ENCOUNTER — Ambulatory Visit: Payer: BC Managed Care – PPO | Admitting: Physician Assistant

## 2019-10-16 DIAGNOSIS — Z0289 Encounter for other administrative examinations: Secondary | ICD-10-CM

## 2019-10-19 ENCOUNTER — Encounter: Payer: Self-pay | Admitting: Physician Assistant

## 2019-10-19 ENCOUNTER — Ambulatory Visit: Payer: BC Managed Care – PPO | Admitting: Physician Assistant

## 2019-10-19 ENCOUNTER — Other Ambulatory Visit: Payer: Self-pay

## 2019-10-19 VITALS — BP 112/80 | HR 64 | Temp 98.1°F | Ht 65.0 in | Wt 240.0 lb

## 2019-10-19 DIAGNOSIS — H6983 Other specified disorders of Eustachian tube, bilateral: Secondary | ICD-10-CM

## 2019-10-19 DIAGNOSIS — H6121 Impacted cerumen, right ear: Secondary | ICD-10-CM | POA: Diagnosis not present

## 2019-10-19 MED ORDER — TRIAMCINOLONE ACETONIDE 55 MCG/ACT NA AERO: 2.0000 | INHALATION_SPRAY | Freq: Every day | NASAL | 1 refills | Status: DC

## 2019-10-19 MED ORDER — CLARITIN-D 24 HOUR 10-240 MG PO TB24: 1.0000 | ORAL_TABLET | Freq: Every day | ORAL | 1 refills | Status: DC

## 2019-10-19 NOTE — Progress Notes (Signed)
Patient presents to clinic today c/o several weeks of pressure in her right ear associated with muffled hearing.  Denies ear pain, drainage from the ear or tinnitus.  Denies symptoms of left ear.  Does have history of chronic rhinitis, taking her OTC Claritin daily.  Is not taking her Flonase as she does not tolerate it well (flowery smell).  Notes a.m. nasal congestion and rhinorrhea.  Improves throughout the day once her medication gets in her system.  Denies any sinus pain, ear tooth pain, fever, chills, chest congestion or cough.  Denies malaise or fatigue.  Past Medical History:  Diagnosis Date  . Allergy    Seasonal  . Asthma   . Bee sting allergy   . History of chickenpox   . Pneumonia 08/2017   RESOLVED    Current Outpatient Medications on File Prior to Visit  Medication Sig Dispense Refill  . albuterol (VENTOLIN HFA) 108 (90 Base) MCG/ACT inhaler Inhale 2 puffs into the lungs as needed. 18 g 1  . ALPRAZolam (XANAX) 0.25 MG tablet Take 0.25 mg by mouth. As needed for anxiety attacks    . EPINEPHrine 0.3 mg/0.3 mL IJ SOAJ injection epinephrine 0.3 mg/0.3 mL injection, auto-injector (Patient not taking: Reported on 10/19/2019)    . sertraline (ZOLOFT) 100 MG tablet Take 1 tablet (100 mg total) by mouth daily. (Patient not taking: Reported on 10/19/2019) 30 tablet 3   No current facility-administered medications on file prior to visit.    Allergies  Allergen Reactions  . Wasp Venom Anaphylaxis and Hives  . Naproxen Other (See Comments)    Hematuria when taken in large doses  . Beeswax   . Other     Ants  . Tape Rash    ADHESIVE AND PAPER TAPE REMOVES SKIN    Family History  Problem Relation Age of Onset  . Alcohol abuse Mother   . Heart attack Father 64  . Asthma Brother   . Diabetes Brother     Social History   Socioeconomic History  . Marital status: Married    Spouse name: Not on file  . Number of children: 2  . Years of education: 77  . Highest education  level: Not on file  Occupational History  . Occupation: Administration  Tobacco Use  . Smoking status: Never Smoker  . Smokeless tobacco: Never Used  Vaping Use  . Vaping Use: Never used  Substance and Sexual Activity  . Alcohol use: Not Currently  . Drug use: No  . Sexual activity: Not Currently    Birth control/protection: Inserts    Comment: Nuvaring  Other Topics Concern  . Not on file  Social History Narrative  . Not on file   Social Determinants of Health   Financial Resource Strain:   . Difficulty of Paying Living Expenses:   Food Insecurity:   . Worried About Charity fundraiser in the Last Year:   . Arboriculturist in the Last Year:   Transportation Needs:   . Film/video editor (Medical):   Marland Kitchen Lack of Transportation (Non-Medical):   Physical Activity:   . Days of Exercise per Week:   . Minutes of Exercise per Session:   Stress:   . Feeling of Stress :   Social Connections:   . Frequency of Communication with Friends and Family:   . Frequency of Social Gatherings with Friends and Family:   . Attends Religious Services:   . Active Member of Clubs or Organizations:   .  Attends Archivist Meetings:   Marland Kitchen Marital Status:    Review of Systems - See HPI.  All other ROS are negative.  BP 112/80   Pulse 64   Temp 98.1 F (36.7 C)   Ht 5\' 5"  (1.651 m)   Wt 240 lb (108.9 kg)   SpO2 99%   BMI 39.94 kg/m   Physical Exam Vitals reviewed.  HENT:     Right Ear: There is impacted cerumen.     Left Ear: Ear canal and external ear normal. A middle ear effusion (Scant serous fluid noted) is present.  Eyes:     Conjunctiva/sclera: Conjunctivae normal.     Pupils: Pupils are equal, round, and reactive to light.  Cardiovascular:     Rate and Rhythm: Normal rate and regular rhythm.     Pulses: Normal pulses.     Heart sounds: Normal heart sounds.  Pulmonary:     Effort: Pulmonary effort is normal.  Musculoskeletal:     Cervical back: Neck supple.    Neurological:     General: No focal deficit present.     Mental Status: Mental status is at baseline.  Psychiatric:        Mood and Affect: Mood normal.    Assessment/Plan: 1. Hearing loss of right ear due to cerumen impaction 2. Eustachian tube dysfunction, bilateral Station tube dysfunction bilaterally with right greater than left.  Also cerumen impaction noted right ear canal.  Unable to remove with curette.  Successfully removed via irrigation with some improvement in hearing.  Repeat examination reveals significant amount of serous fluid behind the right tympanic membrane with some retraction noted.  We will switch her to Claritin-D.  Also start Nasacort 2 sprays each nostril once daily.  Will monitor over the next 1 to 2 weeks as we discussed with patient that it can take some time to resolve.  If no improvement will need to consider addition of steroid taper versus assessment by ear nose and throat.  This visit occurred during the SARS-CoV-2 public health emergency.  Safety protocols were in place, including screening questions prior to the visit, additional usage of staff PPE, and extensive cleaning of exam room while observing appropriate contact time as indicated for disinfecting solutions.     Leeanne Rio, PA-C

## 2019-10-19 NOTE — Patient Instructions (Addendum)
Please start Claritin-D once daily over the next week. I have sent in a prescription for you. If you note any increase in heart rate or palpitations with this medication, stop and call me.  Also start the Nasacort once daily as directed. Consider starting an saline nasal rinse 1-2 times daily.  Close watch symptoms over the next 1 to 2 weeks.  If not noting a substantial improvement/resolution in that timeframe, please let us know. We would then need to consider steroid taper versus ENT referral.  Let us know immediately if anything worsens or new symptoms develop.  Hang in there!

## 2019-10-19 NOTE — Addendum Note (Signed)
Addended by: Otilio Miu on: 10/19/2019 02:41 PM   Modules accepted: Orders

## 2019-11-17 DIAGNOSIS — F411 Generalized anxiety disorder: Secondary | ICD-10-CM | POA: Diagnosis not present

## 2019-12-03 ENCOUNTER — Telehealth: Payer: Self-pay | Admitting: Physician Assistant

## 2019-12-03 NOTE — Telephone Encounter (Signed)
Pt called in still complaining of ear pain. She states it's not getting any better. She still like she has an ear infection and her ears feels full. ELEA

## 2019-12-03 NOTE — Telephone Encounter (Signed)
Needs reassessment. See she has appt tomorrow.

## 2019-12-04 ENCOUNTER — Other Ambulatory Visit: Payer: Self-pay

## 2019-12-04 ENCOUNTER — Encounter: Payer: Self-pay | Admitting: Family Medicine

## 2019-12-04 ENCOUNTER — Ambulatory Visit (INDEPENDENT_AMBULATORY_CARE_PROVIDER_SITE_OTHER): Payer: BC Managed Care – PPO | Admitting: Family Medicine

## 2019-12-04 VITALS — BP 124/92 | HR 82 | Temp 99.8°F | Resp 16 | Ht 65.0 in | Wt 242.0 lb

## 2019-12-04 DIAGNOSIS — J020 Streptococcal pharyngitis: Secondary | ICD-10-CM | POA: Diagnosis not present

## 2019-12-04 MED ORDER — AMOXICILLIN 875 MG PO TABS: 875.0000 mg | ORAL_TABLET | Freq: Two times a day (BID) | ORAL | 0 refills | Status: DC

## 2019-12-04 NOTE — Progress Notes (Signed)
   Subjective:    Patient ID: Zoe Soto, female    DOB: [birth year], 39 y.o.   MRN: [id number]  HPI URI- sxs started Monday w/ a headache.  R ear started hurting on Wednesday.  Had fever to 102.  Yesterday developed sore throat- painful to swallow.  + HA.  Now having L ear pain.  Tuesday 'my stomach was hurting real bad'.  Continues to have 'stomachache'.  No N/V/D.  Took Tylenol and Nyquil yesterday w/ some relief.  Denies nasal congestion, cough.  Yesterday did cough up 'a bloody clump of phlegm'.  Had maxillary and frontal sinus pain on Monday.  Had COVID test on Wednesday (at home test) and Thursday (at [facility]).  Is vaccinated.  + tooth pain when brushing.  No known sick contacts.  Not currently taking allergy medication- 'I usually do but I haven't'.     Review of Systems For ROS see HPI   This visit occurred during the SARS-CoV-2 public health emergency.  Safety protocols were in place, including screening questions prior to the visit, additional usage of staff PPE, and extensive cleaning of exam room while observing appropriate contact time as indicated for disinfecting solutions.       Objective:   Physical Exam Vitals reviewed.  Constitutional:      General: She is not in acute distress.    Appearance: She is well-developed. She is ill-appearing.  HENT:     Head: Normocephalic and atraumatic.     Right Ear: Tympanic membrane and ear canal normal.     Left Ear: Tympanic membrane and ear canal normal.     Nose: No congestion.     Mouth/Throat:     Pharynx: Pharyngeal swelling and oropharyngeal exudate present.     Comments: Bilateral tonsillar enlargement w/ exudate Cardiovascular:     Rate and Rhythm: Normal rate and regular rhythm.     Heart sounds: Normal heart sounds. No murmur heard.   Pulmonary:     Effort: Pulmonary effort is normal. No respiratory distress.     Breath sounds: Normal breath sounds. No wheezing.  Lymphadenopathy:     Cervical: Cervical adenopathy  present.  Skin:    General: Skin is warm and dry.  Neurological:     General: No focal deficit present.     Mental Status: She is alert and oriented to person, place, and time.  Psychiatric:        Mood and Affect: Mood normal.        Behavior: Behavior normal.        Thought Content: Thought content normal.           Assessment & Plan:  Strep throat- new.  Pt's sore throat, HA, abd pain, and fever are consistent w/ dx.  Start Amoxicillin.  Reviewed supportive care and red flags that should prompt return.  Pt expressed understanding and is in agreement w/ plan.

## 2019-12-04 NOTE — Patient Instructions (Signed)
Follow up as needed or as scheduled START the Amoxicillin twice daily- take w/ food Drink LOTS of fluids REST! Alternate tylenol and ibuprofen for pain/fever Call with any questions or concerns Hang in there!

## 2019-12-07 ENCOUNTER — Ambulatory Visit: Payer: BC Managed Care – PPO | Admitting: Physician Assistant

## 2019-12-17 ENCOUNTER — Other Ambulatory Visit: Payer: Self-pay

## 2019-12-17 ENCOUNTER — Ambulatory Visit (INDEPENDENT_AMBULATORY_CARE_PROVIDER_SITE_OTHER): Payer: BC Managed Care – PPO | Admitting: Physician Assistant

## 2019-12-17 ENCOUNTER — Encounter: Payer: Self-pay | Admitting: Physician Assistant

## 2019-12-17 VITALS — BP 118/78 | HR 73 | Temp 98.6°F | Resp 16

## 2019-12-17 DIAGNOSIS — J4530 Mild persistent asthma, uncomplicated: Secondary | ICD-10-CM | POA: Diagnosis not present

## 2019-12-17 DIAGNOSIS — H6982 Other specified disorders of Eustachian tube, left ear: Secondary | ICD-10-CM

## 2019-12-17 DIAGNOSIS — J45909 Unspecified asthma, uncomplicated: Secondary | ICD-10-CM | POA: Insufficient documentation

## 2019-12-17 MED ORDER — AZELASTINE HCL 0.05 % OP SOLN: 1.0000 [drp] | Freq: Two times a day (BID) | OPHTHALMIC | 1 refills | Status: DC

## 2019-12-17 MED ORDER — QVAR REDIHALER 80 MCG/ACT IN AERB
1.0000 | INHALATION_SPRAY | Freq: Two times a day (BID) | RESPIRATORY_TRACT | 0 refills | Status: DC
Start: 2019-12-17 — End: 2020-10-07

## 2019-12-17 NOTE — Progress Notes (Signed)
Patient presents to clinic today for follow-up after treatment for strep throat. Patient endorses completing entire course of antibiotic. Notes sore throat has resolved. No residual fever or fatigue. Is having L ear pressure and popping with occasional pain. Notes some increase in allergy symptoms -- rhinorrhea and watery, itchy eyes. Also with her asthma -- chest tightness and wheezing with increased use of her inhaler. Denies nighttime awakenings. Denies any chest pain or true SOB. Had prior assessment for COVID at beginning of her symptioms with three negative tests. ..   Past Medical History:  Diagnosis Date  . Allergy    Seasonal  . Asthma   . Bee sting allergy   . History of chickenpox   . Pneumonia 08/2017   RESOLVED    Current Outpatient Medications on File Prior to Visit  Medication Sig Dispense Refill  . albuterol (VENTOLIN HFA) 108 (90 Base) MCG/ACT inhaler Inhale 2 puffs into the lungs as needed. (Patient not taking: Reported on 12/04/2019) 18 g 1  . ALPRAZolam (XANAX) 0.25 MG tablet Take 0.25 mg by mouth. As needed for anxiety attacks    . amoxicillin (AMOXIL) 875 MG tablet Take 1 tablet (875 mg total) by mouth 2 (two) times daily. 20 tablet 0  . EPINEPHrine 0.3 mg/0.3 mL IJ SOAJ injection epinephrine 0.3 mg/0.3 mL injection, auto-injector (Patient not taking: Reported on 10/19/2019)    . loratadine-pseudoephedrine (CLARITIN-D 24 HOUR) 10-240 MG 24 hr tablet Take 1 tablet by mouth daily. (Patient not taking: Reported on 12/04/2019) 30 tablet 1  . sertraline (ZOLOFT) 100 MG tablet Take 1 tablet (100 mg total) by mouth daily. (Patient not taking: Reported on 10/19/2019) 30 tablet 3  . triamcinolone (NASACORT) 55 MCG/ACT AERO nasal inhaler Place 2 sprays into the nose daily. (Patient not taking: Reported on 12/04/2019) 1 Inhaler 1   No current facility-administered medications on file prior to visit.    Allergies  Allergen Reactions  . Wasp Venom Anaphylaxis and Hives  .  Naproxen Other (See Comments)    Hematuria when taken in large doses  . Beeswax   . Other     Ants  . Tape Rash    ADHESIVE AND PAPER TAPE REMOVES SKIN    Family History  Problem Relation Age of Onset  . Alcohol abuse Mother   . Heart attack Father 25  . Asthma Brother   . Diabetes Brother     Social History   Socioeconomic History  . Marital status: Married    Spouse name: Not on file  . Number of children: 2  . Years of education: 89  . Highest education level: Not on file  Occupational History  . Occupation: Administration  Tobacco Use  . Smoking status: Never Smoker  . Smokeless tobacco: Never Used  Vaping Use  . Vaping Use: Never used  Substance and Sexual Activity  . Alcohol use: Not Currently  . Drug use: No  . Sexual activity: Not Currently    Birth control/protection: Inserts    Comment: Nuvaring  Other Topics Concern  . Not on file  Social History Narrative  . Not on file   Social Determinants of Health   Financial Resource Strain:   . Difficulty of Paying Living Expenses:   Food Insecurity:   . Worried About Charity fundraiser in the Last Year:   . Arboriculturist in the Last Year:   Transportation Needs:   . Film/video editor (Medical):   Marland Kitchen Lack of Transportation (  Non-Medical):   Physical Activity:   . Days of Exercise per Week:   . Minutes of Exercise per Session:   Stress:   . Feeling of Stress :   Social Connections:   . Frequency of Communication with Friends and Family:   . Frequency of Social Gatherings with Friends and Family:   . Attends Religious Services:   . Active Member of Clubs or Organizations:   . Attends Archivist Meetings:   Marland Kitchen Marital Status:    Review of Systems - See HPI.  All other ROS are negative.  There were no vitals taken for this visit.  Physical Exam Vitals reviewed.  Constitutional:      Appearance: Normal appearance.  HENT:     Head: Normocephalic and atraumatic.     Right Ear:  Tympanic membrane, ear canal and external ear normal. There is no impacted cerumen.     Left Ear: Ear canal and external ear normal. A middle ear effusion (serous) is present. There is no impacted cerumen.     Nose: Nose normal.     Mouth/Throat:     Mouth: Mucous membranes are moist.     Pharynx: Oropharynx is clear. No oropharyngeal exudate.  Eyes:     General: Lids are normal.     Conjunctiva/sclera: Conjunctivae normal.     Pupils: Pupils are equal, round, and reactive to light.  Cardiovascular:     Rate and Rhythm: Normal rate and regular rhythm.     Pulses: Normal pulses.     Heart sounds: Normal heart sounds.  Pulmonary:     Effort: Pulmonary effort is normal.     Breath sounds: Wheezing (faint, lung bases bilaterally) present.  Musculoskeletal:     Cervical back: Neck supple.  Neurological:     General: No focal deficit present.     Mental Status: She is alert and oriented to person, place, and time.  Psychiatric:        Mood and Affect: Mood normal.     Assessment/Plan: 1. Eustachian tube dysfunction, left 2/2 allergic inflammation. Start Claritin daily. Start Nasacort.  Will start Optivar drops as well. Supportive measures reviewed. Return precautions reviewed.   2. Mild persistent asthma without complication Start Qvar. Continue albuterol on as needed.  Basis. Follow-up discussed.  This visit occurred during the SARS-CoV-2 public health emergency.  Safety protocols were in place, including screening questions prior to the visit, additional usage of staff PPE, and extensive cleaning of exam room while observing appropriate contact time as indicated for disinfecting solutions.     Leeanne Rio, PA-C

## 2019-12-17 NOTE — Patient Instructions (Signed)
Please keep well-hydrated and get plenty of rest.  Start a saline nasal rinse 1-2 times daily. Restart your Nasacort nasal spray once daily. Continue your Claritin. I am adding on Qvar once daily as a maintenance inhaler. This should help with chest tightness, coughing and cut down on need for your albuterol inhaler. Okay to use the albuterol inhaler as directed if still needed. Use the Optivar eyedrops daily as directed.  Please let me know if the symptoms do not continue to improve/resolve.  I encourage you to increase hydration and the amount of fiber in your diet.  Start a daily probiotic (Align, Culturelle, Digestive Advantage, etc.). If no bowel movement within 24 hours, take 2 Tbs of Milk of Magnesia in a 4 oz glass of warmed prune juice every 2-3 days to help promote bowel movement. If no results within 24 hours, then repeat above regimen, adding a Dulcolax stool softener to regimen. If this does not promote a bowel movement, please call the office.  You will be contacted for an assessment by gastroenterology.  We are  Hang in there!

## 2019-12-26 DIAGNOSIS — Z20822 Contact with and (suspected) exposure to covid-19: Secondary | ICD-10-CM | POA: Diagnosis not present

## 2019-12-27 DIAGNOSIS — U071 COVID-19: Secondary | ICD-10-CM | POA: Diagnosis not present

## 2019-12-28 ENCOUNTER — Encounter: Payer: Self-pay | Admitting: Physician Assistant

## 2019-12-28 ENCOUNTER — Telehealth: Payer: Self-pay | Admitting: Physician Assistant

## 2019-12-28 ENCOUNTER — Other Ambulatory Visit: Payer: Self-pay | Admitting: Physician Assistant

## 2019-12-28 DIAGNOSIS — U071 COVID-19: Secondary | ICD-10-CM

## 2019-12-28 DIAGNOSIS — I471 Supraventricular tachycardia: Secondary | ICD-10-CM

## 2019-12-28 DIAGNOSIS — J4520 Mild intermittent asthma, uncomplicated: Secondary | ICD-10-CM

## 2019-12-28 NOTE — Progress Notes (Signed)
I connected by phone with Zoe Soto on 12/28/2019 at 3:56 PM to discuss the potential use of a new treatment for mild to moderate COVID-19 viral infection in non-hospitalized patients.  This patient is a 39 y.o. female that meets the FDA criteria for Emergency Use Authorization of COVID monoclonal antibody casirivimab/imdevimab.  Has a (+) direct SARS-CoV-2 viral test result  Has mild or moderate COVID-19   Is NOT hospitalized due to COVID-19  Is within 10 days of symptom onset  Has at least one of the high risk factor(s) for progression to severe COVID-19 and/or hospitalization as defined in EUA.  Specific high risk criteria : Chronic Lung Disease. PSVT and obesity   I have spoken and communicated the following to the patient or parent/caregiver regarding COVID monoclonal antibody treatment:  1. FDA has authorized the emergency use for the treatment of mild to moderate COVID-19 in adults and pediatric patients with positive results of direct SARS-CoV-2 viral testing who are 35 years of age and older weighing at least 40 kg, and who are at high risk for progressing to severe COVID-19 and/or hospitalization.  2. The significant known and potential risks and benefits of COVID monoclonal antibody, and the extent to which such potential risks and benefits are unknown.  3. Information on available alternative treatments and the risks and benefits of those alternatives, including clinical trials.  4. Patients treated with COVID monoclonal antibody should continue to self-isolate and use infection control measures (e.g., wear mask, isolate, social distance, avoid sharing personal items, clean and disinfect "high touch" surfaces, and frequent handwashing) according to CDC guidelines.   5. The patient or parent/caregiver has the option to accept or refuse COVID monoclonal antibody treatment.  After reviewing this information with the patient, The patient agreed to proceed with receiving  casirivimab\imdevimab infusion and will be provided a copy of the Fact sheet prior to receiving the infusion.   Zoe Soto 12/28/2019 3:56 PM

## 2019-12-28 NOTE — Telephone Encounter (Signed)
  Called to discuss with patient about Covid symptoms and the use of casirivimab/imdevimab, a monoclonal antibody infusion for those with mild to moderate Covid symptoms and at a high risk of hospitalization.  Pt is qualified for this infusion at the [facility] due  Asthma and obesity.    Message left to call back.   Leanor Kail, PA - C

## 2019-12-29 ENCOUNTER — Ambulatory Visit (HOSPITAL_COMMUNITY)
Admission: RE | Admit: 2019-12-29 | Discharge: 2019-12-29 | Disposition: A | Discharge status: Home / Self Care | Payer: BC Managed Care – PPO | Source: Ambulatory Visit | Attending: Pulmonary Disease | Admitting: Pulmonary Disease

## 2019-12-29 DIAGNOSIS — U071 COVID-19: Secondary | ICD-10-CM | POA: Diagnosis not present

## 2019-12-29 MED ORDER — DIPHENHYDRAMINE HCL 50 MG/ML IJ SOLN: 50.0000 mg | Freq: Once | INTRAMUSCULAR | Status: DC | PRN

## 2019-12-29 MED ORDER — SODIUM CHLORIDE 0.9 % IV SOLN: INTRAVENOUS | Status: DC | PRN

## 2019-12-29 MED ORDER — EPINEPHRINE 0.3 MG/0.3ML IJ SOAJ: 0.3000 mg | Freq: Once | INTRAMUSCULAR | Status: DC | PRN

## 2019-12-29 MED ORDER — SODIUM CHLORIDE 0.9 % IV SOLN
1200.0000 mg | Freq: Once | INTRAVENOUS | Status: AC
  Administered 2019-12-29: 1200 mg via INTRAVENOUS
  Filled 2019-12-29: qty 10

## 2019-12-29 MED ORDER — ALBUTEROL SULFATE HFA 108 (90 BASE) MCG/ACT IN AERS: 2.0000 | INHALATION_SPRAY | Freq: Once | RESPIRATORY_TRACT | Status: DC | PRN

## 2019-12-29 MED ORDER — FAMOTIDINE IN NACL 20-0.9 MG/50ML-% IV SOLN: 20.0000 mg | Freq: Once | INTRAVENOUS | Status: DC | PRN

## 2019-12-29 MED ORDER — METHYLPREDNISOLONE SODIUM SUCC 125 MG IJ SOLR: 125.0000 mg | Freq: Once | INTRAMUSCULAR | Status: DC | PRN

## 2019-12-29 NOTE — Discharge Instructions (Signed)

## 2019-12-29 NOTE — Progress Notes (Signed)
  Diagnosis: COVID-19  Physician:Dr Joya Gaskins  Procedure: Covid Infusion Clinic Med: casirivimab\imdevimab infusion - Provided patient with casirivimab\imdevimab fact sheet for patients, parents and caregivers prior to infusion.  Complications: No immediate complications noted.  Discharge: Discharged home   Zoe Soto, Zoe Soto

## 2020-01-01 ENCOUNTER — Encounter: Payer: Self-pay | Admitting: Physician Assistant

## 2020-01-12 ENCOUNTER — Encounter: Payer: Self-pay | Admitting: Physician Assistant

## 2020-01-12 DIAGNOSIS — R195 Other fecal abnormalities: Secondary | ICD-10-CM

## 2020-01-25 DIAGNOSIS — N920 Excessive and frequent menstruation with regular cycle: Secondary | ICD-10-CM | POA: Diagnosis not present

## 2020-01-28 ENCOUNTER — Telehealth: Payer: Self-pay | Admitting: Physician Assistant

## 2020-01-28 DIAGNOSIS — Z0184 Encounter for antibody response examination: Secondary | ICD-10-CM

## 2020-01-28 NOTE — Telephone Encounter (Signed)
Called pt and left a detailed message to advise. Quantiferon and Varicella titers already ordered. Ok for Tdap.

## 2020-01-28 NOTE — Telephone Encounter (Signed)
She can have Tdap (last recorded was at age 39 unless she has other documentation), no documentation of either varicella vaccine so she will need titers prior to vaccination (as she may be immune from having had chicken pox).  OK for TB test.  She can have nurse visit tomorrow for Tdap and TB and titers

## 2020-01-28 NOTE — Telephone Encounter (Signed)
Patient is request the following vaccines - Varicella,TDAP, and a TB test. Patient is needing them before Monday but patient is aware it takes around 3 days to be TB test results. Please advise on scheduling  Thank you!

## 2020-02-17 ENCOUNTER — Encounter: Payer: Self-pay | Admitting: Gastroenterology

## 2020-04-02 ENCOUNTER — Other Ambulatory Visit: Payer: Self-pay | Admitting: Physician Assistant

## 2020-04-02 DIAGNOSIS — F419 Anxiety disorder, unspecified: Secondary | ICD-10-CM

## 2020-04-11 ENCOUNTER — Ambulatory Visit: Payer: BC Managed Care – PPO | Admitting: Gastroenterology

## 2020-05-10 ENCOUNTER — Encounter: Payer: Self-pay | Admitting: Physician Assistant

## 2020-05-10 ENCOUNTER — Other Ambulatory Visit: Payer: Self-pay

## 2020-05-10 ENCOUNTER — Telehealth (INDEPENDENT_AMBULATORY_CARE_PROVIDER_SITE_OTHER): Payer: BC Managed Care – PPO | Admitting: Physician Assistant

## 2020-05-10 DIAGNOSIS — K2900 Acute gastritis without bleeding: Secondary | ICD-10-CM | POA: Diagnosis not present

## 2020-05-10 MED ORDER — PANTOPRAZOLE SODIUM 40 MG PO TBEC: 40.0000 mg | DELAYED_RELEASE_TABLET | Freq: Every day | ORAL | 3 refills | Status: DC

## 2020-05-10 NOTE — Patient Instructions (Signed)
Instructions sent to patients MyChart.

## 2020-05-10 NOTE — Progress Notes (Signed)
Virtual Visit via Video   I connected with patient on 05/10/20 at  4:00 PM EST by a video enabled telemedicine application and verified that I am speaking with the correct person using two identifiers.  Location patient: Home Location provider: [facility] Office Persons participating in the virtual visit: Patient, Provider, Martelle (Patina Moore)  I discussed the limitations of evaluation and management by telemedicine and the availability of in person appointments. The patient expressed understanding and agreed to proceed.  Subjective:   HPI:   Patient presents via [facility] today c/o 2 weeks of episodic upper abdominal/epigastric pain worse after meals or drinking beverages.  Notes occasional burning and soreness at the bottom of her esophagus.  Notes pain pretty quickly after eating, usually resolves after 30 minutes.  Has noted some increased gas and belching along with mild bloating.  Denies any notation of true heartburn itself.  Denies melena, hematochezia or tenesmus.  Has history of chronic constipation, unchanged.  Has not taken anything for symptoms.  He is scheduled for an appointment gastroenterology on 05/20/2019.  ROS:   See pertinent positives and negatives per HPI.  Patient Active Problem List   Diagnosis Date Noted  . Asthma   . Heart palpitations 05/13/2019  . Paroxysmal SVT (supraventricular tachycardia) ([place]) 05/13/2019  . PAC (premature atrial contraction) 05/13/2019  . PVC (premature ventricular contraction) 05/13/2019  . H/O laparoscopy 06/27/2018  . Anemia 06/04/2018  . Herpes simplex virus type 2 (HSV-2) infection affecting pregnancy in third trimester 02/21/2018  . Intramural leiomyoma of uterus 05/26/2013  . Irregular menstruation 08/04/2010    Social History   Tobacco Use  . Smoking status: Never Smoker  . Smokeless tobacco: Never Used  Substance Use Topics  . Alcohol use: Not Currently    Current Outpatient Medications:  .  albuterol  (VENTOLIN HFA) 108 (90 Base) MCG/ACT inhaler, Inhale 2 puffs into the lungs as needed. (Patient not taking: Reported on 12/04/2019), Disp: 18 g, Rfl: 1 .  ALPRAZolam (XANAX) 0.25 MG tablet, Take 0.25 mg by mouth. As needed for anxiety attacks, Disp: , Rfl:  .  amoxicillin (AMOXIL) 875 MG tablet, Take 1 tablet (875 mg total) by mouth 2 (two) times daily., Disp: 20 tablet, Rfl: 0 .  azelastine (OPTIVAR) 0.05 % ophthalmic solution, Place 1 drop into both eyes 2 (two) times daily., Disp: 6 mL, Rfl: 1 .  beclomethasone (QVAR REDIHALER) 80 MCG/ACT inhaler, Inhale 1 puff into the lungs 2 (two) times daily., Disp: 10.6 g, Rfl: 0 .  EPINEPHrine 0.3 mg/0.3 mL IJ SOAJ injection, epinephrine 0.3 mg/0.3 mL injection, auto-injector (Patient not taking: Reported on 10/19/2019), Disp: , Rfl:  .  loratadine-pseudoephedrine (CLARITIN-D 24 HOUR) 10-240 MG 24 hr tablet, Take 1 tablet by mouth daily. (Patient not taking: Reported on 12/04/2019), Disp: 30 tablet, Rfl: 1 .  sertraline (ZOLOFT) 100 MG tablet, TAKE 1 TABLET(100 MG) BY MOUTH DAILY, Disp: 90 tablet, Rfl: 1 .  triamcinolone (NASACORT) 55 MCG/ACT AERO nasal inhaler, Place 2 sprays into the nose daily. (Patient not taking: Reported on 12/04/2019), Disp: 1 Inhaler, Rfl: 1  Allergies  Allergen Reactions  . Wasp Venom Anaphylaxis and Hives  . Naproxen Other (See Comments)    Hematuria when taken in large doses  . Beeswax   . Other     Ants  . Tape Rash    ADHESIVE AND PAPER TAPE REMOVES SKIN    Objective:   There were no vitals taken for this visit.  Patient is well-developed, well-nourished  in no acute distress.  Resting comfortably at home.  Head is normocephalic, atraumatic.  No labored breathing.  Speech is clear and coherent with logical content.  Patient is alert and oriented at baseline.   Assessment and Plan:   1. Acute gastritis without hemorrhage, unspecified gastritis type GERD diet reviewed.  We'll add on pantoprazole 40 mg once daily.   We'll have her utilize OTC famotidine nightly for the first couple days while PPI is building up in her system.  Avoid NSAIDs or alcohol.  Follow-up with gastroenterology as scheduled.  Strict return precautions discussed with patient who voiced understanding and agreement with plan.    Piedad Climes, PA-C 05/10/2020

## 2020-05-19 ENCOUNTER — Encounter: Payer: Self-pay | Admitting: Gastroenterology

## 2020-05-19 ENCOUNTER — Telehealth: Payer: Self-pay

## 2020-05-19 ENCOUNTER — Ambulatory Visit (INDEPENDENT_AMBULATORY_CARE_PROVIDER_SITE_OTHER): Payer: BC Managed Care – PPO | Admitting: Gastroenterology

## 2020-05-19 VITALS — BP 122/80 | HR 72 | Ht 65.0 in | Wt 253.2 lb

## 2020-05-19 DIAGNOSIS — R194 Change in bowel habit: Secondary | ICD-10-CM | POA: Diagnosis not present

## 2020-05-19 DIAGNOSIS — K5900 Constipation, unspecified: Secondary | ICD-10-CM | POA: Diagnosis not present

## 2020-05-19 DIAGNOSIS — K6389 Other specified diseases of intestine: Secondary | ICD-10-CM | POA: Diagnosis not present

## 2020-05-19 DIAGNOSIS — R140 Abdominal distension (gaseous): Secondary | ICD-10-CM | POA: Diagnosis not present

## 2020-05-19 MED ORDER — RIFAXIMIN 550 MG PO TABS: 550.0000 mg | ORAL_TABLET | Freq: Three times a day (TID) | ORAL | 0 refills | Status: AC

## 2020-05-19 MED ORDER — OMEPRAZOLE 40 MG PO CPDR: 40.0000 mg | DELAYED_RELEASE_CAPSULE | Freq: Every morning | ORAL | 3 refills | Status: DC

## 2020-05-19 NOTE — Patient Instructions (Addendum)
I am recommending a FODMAP diet.  Some dietary tips that may help relief symptoms include: - Drink plenty of water and avoid carbonated beverages, which may cause gas - Don't chew gum or eat too quickly; these behaviors can cause you to swallow air, which can cause gas. - Try eating smaller meals more frequently and avoid large meals. - Try increasing fiber in your diet gradually. Fiber can help reduce both constipation and diarrhea, but it can also make gas and cramping worse. I recommended slowing increasing the amount of fiber that you eat. - Avoid foods and beverages that make you fee worse. For some people these include alcohol, drinks with caffeine, dairy products, beans, cauliflower, cabbage, and broccoli.   I recommend trying a low-FODMAP diet to identify the foods that trigger your gas. This stands for "fermentable oligodi-monosaccharides and polyols," or, more simply, certain types of carbohydrates found in foods that are hard to digest. By following FODMAP, also known as a low-FODMAP diet, you avoid or limit these particular carbohydrates. Some of the foods that contain FODMAPs include: - Fruits such as apples, apricots, blackberries, cherries, mango, nectarines, pears, plums, and watermelon, or juice containing any of these fruits - Canned fruit in natural fruit juice, or large quantities of fruit juice or dried fruit - Vegentables such as artichokes, asparagus, beans, cabbage, cauliflower, garlic and garlic salts, lentils, mushrooms, onions, and sugar snap or snow peas - Dairy products such as milk, milk products, soft cheese, yogurt, custard, and ice cream - Wheat and rye products - Honey and foods with high-fructose corn syrup  - Products, including candy and gum, with sweeteners ending in "-ol" (for example, sorbitol, mannitol, xylitol, and maltitol)  My favorite websites for more information include: RelicTreasures.se  I have recommended a trial of antibiotics for  bacterial overgrowth that occurred after your surgery. Although this may improve your gas, it may not treatment/prevent the ultimate problem of scarring.   Continue to take your omeprazole although I would increase the dose to 40 mg every morning.   PRESCRIPTION MEDICATION(S): We have sent the following medication(s) to your pharmacy:  . Omeprazole - Please take 40mg  by mouth every morning . Xifaxan - Please take 550mg  by mouth x14 days. NOTE - THIS MEDICATION WILL LIKELY REQUIRE A PRIOR AUTHORIZATION WITH YOUR INSURANCE. WE WILL SUBMIT FOR APPROVAL.  I have recommended a colonoscopy and an upper endoscopy.  COLONOSCOPY AND ENDOSCOPY: You have been scheduled for an endoscopy and colonoscopy. Please follow the written instructions given to you at your visit today.  PREP: Please pick up your prep supplies at the pharmacy within the next 1-3 days.  INHALERS: If you use inhalers (even only as needed), please bring them with you on the day of your procedure.  If you are age 6 or younger, your body mass index should be between 19-25. Your Body mass index is 42.14 kg/m. If this is out of the aformentioned range listed, please consider follow up with your Primary Care Provider.   Thank you for trusting me with your gastrointestinal care!    Thornton Park, MD, MPH

## 2020-05-19 NOTE — Progress Notes (Signed)
Referring Provider: Delorse Limber Primary Care Physician:  Brunetta Jeans, PA-C  Reason for Consultation: Change in stool   IMPRESSION:  Change in bowel habits that developed after BTL with small bowel injury repair 06/27/18. Not improved by laxatives, magnesium, elderberry or daily probiotic.Likely adhesions +/- SIBO +/- functional overlay. Must exclude other organic etiologies.    PLAN: Increase omeprazole to 40 mg QAM Xifaxan 550 mg TID x 14 days Consider trial of Linzess EGD Colonoscopy after a 2 day bowel prep given her history of constipation Discussed lowFODMAP diet Cross-sectional imaging if endoscopy evaluation is negative  Please see the "Patient Instructions" section for addition details about the plan.  HPI: Zoe Soto is a 40 y.o. female referred by PA Hassell Done for further evaluation of severe constipation and gas. The history is obtained through the patient and review of her electronic health record. 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  She had Covid in August 2021 receiving casirivimab\imdevimab infusion.   She reports constipation with baseline bowel habits of a bowel movement every 3-4 days that developed after bilateral tubal ligation 2/40/97 that was complicated by concurrent small bowel injury repair. Recovery took a lot of of her physically and emotionally.  Since that time, her bowel habits have not been normal. She will go1-2 days to even 3 weeks between bowel movements. Doesn't find laxatives helpful, magnesium may provide relief. Started elderberry and probiotic.  Significant gas with upper abdominal discomfort, bloating, eructation, and flatus. Worse after meals.  Will use Tums when it's really bad. Wants her bowel habits to return to normal.  Worsened by eats vegetables. Can tolerate meats.   Started pantoprazole 40 mg daily earlier this month using famotidine as a bridge.   Has regained  weight with all of this going on - as she is eating non-healthy foods to minimize her symptoms.   No known family history of colon cancer or polyps. No family history of uterine/endometrial cancer, pancreatic cancer or gastric/stomach cancer.   Past Medical History:  Diagnosis Date  . Allergy    Seasonal  . Asthma   . Bee sting allergy   . History of chickenpox   . Pneumonia 08/2017   RESOLVED    Past Surgical History:  Procedure Laterality Date  . ANKLE SURGERY Right 2011  . DILITATION & CURRETTAGE/HYSTROSCOPY WITH NOVASURE ABLATION N/A 06/27/2018   Procedure: DILATATION & CURETTAGE/HYSTEROSCOPY WITH NOVASURE ABLATION;  Surgeon: Tyson Dense, MD;  Location: [facility];  Service: Gynecology;  Laterality: N/A;  . GASTRIC BYPASS  07/2015  . KNEE ARTHROSCOPY Left 2010  . LAPAROSCOPIC TUBAL LIGATION Bilateral 06/27/2018   Procedure: LAPAROSCOPIC TUBAL LIGATION;  Surgeon: Tyson Dense, MD;  Location: [facility];  Service: Gynecology;  Laterality: Bilateral;  . SMALL BOWEL REPAIR N/A 06/27/2018   Procedure: SMALL BOWEL REPAIR;  Surgeon: Tyson Dense, MD;  Location: [facility];  Service: Gynecology;  Laterality: N/A;    Current Outpatient Medications  Medication Sig Dispense Refill  . beclomethasone (QVAR REDIHALER) 80 MCG/ACT inhaler Inhale 1 puff into the lungs 2 (two) times daily. 10.6 g 0   No current facility-administered medications for this visit.    Allergies as of 05/19/2020 - Review Complete 05/10/2020  Allergen Reaction Noted  . Wasp venom Anaphylaxis and Hives 03/27/2018  . Naproxen Other (See Comments) 08/01/2011  . Beeswax  09/25/2018  . Other  05/13/2019  . Tape Rash 01/28/2015  Family History  Problem Relation Age of Onset  . Alcohol abuse Mother   . Heart attack Father 38  . Asthma Brother   . Diabetes Brother     Social History   Socioeconomic History  . Marital status: Married     Spouse name: Not on file  . Number of children: 2  . Years of education: 56  . Highest education level: Not on file  Occupational History  . Occupation: Administration  Tobacco Use  . Smoking status: Never Smoker  . Smokeless tobacco: Never Used  Vaping Use  . Vaping Use: Never used  Substance and Sexual Activity  . Alcohol use: Not Currently  . Drug use: No  . Sexual activity: Not Currently    Birth control/protection: Inserts    Comment: Nuvaring  Other Topics Concern  . Not on file  Social History Narrative  . Not on file   Social Determinants of Health   Financial Resource Strain: Not on file  Food Insecurity: Not on file  Transportation Needs: Not on file  Physical Activity: Not on file  Stress: Not on file  Social Connections: Not on file  Intimate Partner Violence: Not on file    Review of Systems: 12 system ROS is negative except as noted above.   Physical Exam: General:   Alert,  well-nourished, pleasant and cooperative in NAD Head:  Normocephalic and atraumatic. Eyes:  Sclera clear, no icterus.   Conjunctiva pink. Ears:  Normal auditory acuity. Nose:  No deformity, discharge,  or lesions. Mouth:  No deformity or lesions.   Neck:  Supple; no masses or thyromegaly. Lungs:  Clear throughout to auscultation.   No wheezes. Heart:  Regular rate and rhythm; no murmurs. Abdomen:  Soft,nontender, nondistended, normal bowel sounds, no rebound or guarding. No hepatosplenomegaly.   Rectal:  Deferred  Msk:  Symmetrical. No boney deformities LAD: No inguinal or umbilical LAD Extremities:  No clubbing or edema. Neurologic:  Alert and  oriented x4;  grossly nonfocal Skin:  Intact without significant lesions or rashes. Psych:  Alert and cooperative. Normal mood and affect.   Brittnee Gaetano L. Tarri Glenn, MD, MPH 05/19/2020, 10:16 AM

## 2020-05-19 NOTE — Telephone Encounter (Signed)
PRIOR AUTHORIZATION  PA initiation date: 05/19/20  Medication: [place]: [place] completed electronically through [employer] My Meds: Yes  Will await insurance response re: approval/denial.  Ernest Haber ([place]: [place])  Your information has been submitted to [place]. [employer] [place] will review the request and notify you of the determination decision directly, typically within 72 hours of receiving all information.  You will also receive your request decision electronically. To check for an update later, open this request again from your dashboard.  If [employer] [place] has not responded within the specified timeframe or if you have any questions about your PA submission, contact [facility] [place] directly at [phone].

## 2020-05-19 NOTE — Telephone Encounter (Signed)
APPROVAL  Medication: [employer]: [employer]BS PA response: APPROVED Approval dates: 05/19/20 through 05/18/21  Zoe Soto ([place]: [facility])  This request has received a Favorable outcome from [place].  Please keep in mind this is not a guarantee of payment. Eligibility and Benefit determinations will be made at the time of service.  Please note any additional information provided by [facility] [place] at the bottom of the screen.

## 2020-05-31 ENCOUNTER — Ambulatory Visit (AMBULATORY_SURGERY_CENTER): Payer: BC Managed Care – PPO | Admitting: Gastroenterology

## 2020-05-31 ENCOUNTER — Encounter: Payer: Self-pay | Admitting: Gastroenterology

## 2020-05-31 ENCOUNTER — Other Ambulatory Visit: Payer: Self-pay

## 2020-05-31 ENCOUNTER — Other Ambulatory Visit: Payer: Self-pay | Admitting: Gastroenterology

## 2020-05-31 VITALS — BP 155/93 | HR 60 | Temp 97.5°F | Resp 15 | Ht 65.0 in | Wt 253.0 lb

## 2020-05-31 DIAGNOSIS — R194 Change in bowel habit: Secondary | ICD-10-CM

## 2020-05-31 DIAGNOSIS — R1031 Right lower quadrant pain: Secondary | ICD-10-CM

## 2020-05-31 DIAGNOSIS — K5900 Constipation, unspecified: Secondary | ICD-10-CM

## 2020-05-31 DIAGNOSIS — K648 Other hemorrhoids: Secondary | ICD-10-CM

## 2020-05-31 DIAGNOSIS — K6389 Other specified diseases of intestine: Secondary | ICD-10-CM

## 2020-05-31 DIAGNOSIS — D120 Benign neoplasm of cecum: Secondary | ICD-10-CM | POA: Diagnosis not present

## 2020-05-31 MED ORDER — SODIUM CHLORIDE 0.9 % IV SOLN: 500.0000 mL | Freq: Once | INTRAVENOUS | Status: DC

## 2020-05-31 NOTE — Progress Notes (Signed)
Called to room to assist during endoscopic procedure.  Patient ID and intended procedure confirmed with present staff. Received instructions for my participation in the procedure from the performing physician.  

## 2020-05-31 NOTE — Op Note (Signed)
[place] Patient Name: Zoe Soto Procedure Date: 05/31/2020 9:46 AM MRN: [id number] Endoscopist: Thornton Park MD, MD Age: 40 Referring MD:  Date of Birth: 1981-02-11 Gender: Female Account #: [id number] Procedure:                Colonoscopy Indications:              Upper abdominal pain, Change in bowel habits Medicines:                Monitored Anesthesia Care Procedure:                Pre-Anesthesia Assessment:                           - Prior to the procedure, a History and Physical                            was performed, and patient medications and                            allergies were reviewed. The patient's tolerance of                            previous anesthesia was also reviewed. The risks                            and benefits of the procedure and the sedation                            options and risks were discussed with the patient.                            All questions were answered, and informed consent                            was obtained. Prior Anticoagulants: The patient has                            taken no previous anticoagulant or antiplatelet                            agents. ASA Grade Assessment: II - A patient with                            mild systemic disease. After reviewing the risks                            and benefits, the patient was deemed in                            satisfactory condition to undergo the procedure.                           After obtaining informed consent, the colonoscope  was passed under direct vision. Throughout the                            procedure, the patient's blood pressure, pulse, and                            oxygen saturations were monitored continuously. The                            Olympus PCF-H190DL (#[id number]) Colonoscope was                            introduced through the anus and advanced to the 5                            cm into the  ileum. The colonoscopy was performed                            without difficulty. The patient tolerated the                            procedure well. The quality of the bowel                            preparation was good. The terminal ileum, ileocecal                            valve, appendiceal orifice, and rectum were                            photographed. Photographs were not saved in                            Provation. They have been uploaded into EPIC. Scope In: 9:59:32 AM Scope Out: 10:16:19 AM Scope Withdrawal Time: 0 hours 13 minutes 27 seconds  Total Procedure Duration: 0 hours 16 minutes 47 seconds  Findings:                 The perianal and digital rectal examinations were                            normal.                           A 3 mm polyp was found in the cecum. The polyp was                            sessile. The polyp was removed with a cold snare.                            Resection and retrieval were complete. Estimated                            blood loss was minimal.  Non-bleeding internal hemorrhoids were found.                           The examined terminal ileum appeared normal. The                            exam was otherwise without abnormality on direct                            and retroflexion views. Complications:            No immediate complications. Estimated blood loss:                            Minimal. Estimated Blood Loss:     Estimated blood loss was minimal. Impression:               - One 3 mm polyp in the cecum, removed with a cold                            snare. Resected and retrieved.                           - Non-bleeding internal hemorrhoids.                           - The examination was otherwise normal on direct                            and retroflexion views. Recommendation:           - Patient has a contact number available for                            emergencies. The signs and  symptoms of potential                            delayed complications were discussed with the                            patient. Return to normal activities tomorrow.                            Written discharge instructions were provided to the                            patient.                           - High fiber diet.                           - Continue present medications.                           - Await pathology results.                           -  Repeat colonoscopy date to be determined after                            pending pathology results are reviewed for                            surveillance.                           - Emerging evidence supports eating a diet of                            fruits, vegetables, grains, calcium, and yogurt                            while reducing red meat and alcohol may reduce the                            risk of colon cancer.                           - Thank you for allowing me to be involved in your                            colon cancer prevention. Tressia Danas MD, MD 05/31/2020 10:26:43 AM This report has been signed electronically.

## 2020-05-31 NOTE — Progress Notes (Signed)
Vs by CW in adm 

## 2020-05-31 NOTE — Op Note (Signed)
[facility] Patient Name: Zoe Soto Procedure Date: 05/31/2020 9:47 AM MRN: [id number] Endoscopist: Tressia Danas MD, MD Age: 40 Referring MD:  Date of Birth: April 16, 1981 Gender: Female Account #: [id number] Procedure:                Upper GI endoscopy Indications:              Upper abdominal pain, Abdominal bloating, Eructation Medicines:                Monitored Anesthesia Care Procedure:                Pre-Anesthesia Assessment:                           - Prior to the procedure, a History and Physical                            was performed, and patient medications and                            allergies were reviewed. The patient's tolerance of                            previous anesthesia was also reviewed. The risks                            and benefits of the procedure and the sedation                            options and risks were discussed with the patient.                            All questions were answered, and informed consent                            was obtained. Prior Anticoagulants: The patient has                            taken no previous anticoagulant or antiplatelet                            agents. ASA Grade Assessment: II - A patient with                            mild systemic disease. After reviewing the risks                            and benefits, the patient was deemed in                            satisfactory condition to undergo the procedure.                           After obtaining informed consent, the endoscope was  passed under direct vision. Throughout the                            procedure, the patient's blood pressure, pulse, and                            oxygen saturations were monitored continuously. The                            Endoscope was introduced through the mouth, and                            advanced to the jejunum. The upper GI endoscopy was                             accomplished without difficulty. The patient                            tolerated the procedure well. Photographs were                            captured but not saved in Provation. Instead, the                            images were captured separately and uploaded into                            EPIC. Scope In: Scope Out: Findings:                 The examined esophagus was normal.                           The gastric mucosa appeared normal. Evidence of a                            Roux-en-Y gastrojejunostomy was found. The                            gastrojejunal anastomosis was characterized by                            healthy appearing mucosa, although there was an                            area of scarring suggesting prior ulcer. 4 sutures                            were present. This was traversed. The                            pouch-to-jejunum limb was characterized by healthy                            appearing mucosa. The duodenum-to-jejunum limb was  not examined as it could not be found.                           The examined jejunum was normal. This was biopsied                            with a cold forceps for histology. Estimated blood                            loss was minimal. Complications:            No immediate complications. Estimated blood loss:                            Minimal. Estimated Blood Loss:     Estimated blood loss was minimal. Impression:               - Normal esophagus.                           - Roux-en-Y gastrojejunostomy with gastrojejunal                            anastomosis characterized by healthy appearing                            mucosa.                           - Normal examined jejunum. Biopsied. Recommendation:           - Patient has a contact number available for                            emergencies. The signs and symptoms of potential                            delayed complications were  discussed with the                            patient. Return to normal activities tomorrow.                            Written discharge instructions were provided to the                            patient.                           - Resume previous diet.                           - Continue present medications.                           - Await pathology results.                           -  Proceed with colonoscopy as previously planned. Thornton Park MD, MD 05/31/2020 10:23:45 AM This report has been signed electronically.

## 2020-05-31 NOTE — Patient Instructions (Signed)
Handouts Provided:  Polyps  YOU HAD AN ENDOSCOPIC PROCEDURE TODAY AT THE [facility]:   Refer to the procedure report that was given to you for any specific questions about what was found during the examination.  If the procedure report does not answer your questions, please call your gastroenterologist to clarify.  If you requested that your care partner not be given the details of your procedure findings, then the procedure report has been included in a sealed envelope for you to review at your convenience later.  YOU SHOULD EXPECT: Some feelings of bloating in the abdomen. Passage of more gas than usual.  Walking can help get rid of the air that was put into your GI tract during the procedure and reduce the bloating. If you had a lower endoscopy (such as a colonoscopy or flexible sigmoidoscopy) you may notice spotting of blood in your stool or on the toilet paper. If you underwent a bowel prep for your procedure, you may not have a normal bowel movement for a few days.  Please Note:  You might notice some irritation and congestion in your nose or some drainage.  This is from the oxygen used during your procedure.  There is no need for concern and it should clear up in a day or so.  SYMPTOMS TO REPORT IMMEDIATELY:  Following lower endoscopy (colonoscopy or flexible sigmoidoscopy):  Excessive amounts of blood in the stool  Significant tenderness or worsening of abdominal pains  Swelling of the abdomen that is new, acute  Fever of 100F or higher  Following upper endoscopy (EGD)  Vomiting of blood or coffee ground material  New chest pain or pain under the shoulder blades  Painful or persistently difficult swallowing  New shortness of breath  Fever of 100F or higher  Black, tarry-looking stools  For urgent or emergent issues, a gastroenterologist can be reached at any hour by calling [phone]. Do not use MyChart messaging for urgent concerns.    DIET:  We do recommend  a small meal at first, but then you may proceed to your regular diet.  Drink plenty of fluids but you should avoid alcoholic beverages for 24 hours.  ACTIVITY:  You should plan to take it easy for the rest of today and you should NOT DRIVE or use heavy machinery until tomorrow (because of the sedation medicines used during the test).    FOLLOW UP: Our staff will call the number listed on your records 48-72 hours following your procedure to check on you and address any questions or concerns that you may have regarding the information given to you following your procedure. If we do not reach you, we will leave a message.  We will attempt to reach you two times.  During this call, we will ask if you have developed any symptoms of COVID 19. If you develop any symptoms (ie: fever, flu-like symptoms, shortness of breath, cough etc.) before then, please call [phone].  If you test positive for Covid 19 in the 2 weeks post procedure, please call and report this information to us.    If any biopsies were taken you will be contacted by phone or by letter within the next 1-3 weeks.  Please call us at [phone] if you have not heard about the biopsies in 3 weeks.    SIGNATURES/CONFIDENTIALITY: You and/or your care partner have signed paperwork which will be entered into your electronic medical record.  These signatures attest to the fact that that the   that the information above on your After Visit Summary has been reviewed and is understood.  Full responsibility of the confidentiality of this discharge information lies with you and/or your care-partner.

## 2020-05-31 NOTE — Progress Notes (Signed)
To PACU, VSS. Report to [email or web address] 

## 2020-06-02 ENCOUNTER — Telehealth: Payer: Self-pay

## 2020-06-02 ENCOUNTER — Telehealth: Payer: Self-pay | Admitting: *Deleted

## 2020-06-02 NOTE — Telephone Encounter (Signed)
Left message on follow up call. 

## 2020-06-02 NOTE — Telephone Encounter (Signed)
  Follow up Call-  Call back number 05/31/2020  Post procedure Call Back phone  # [phone]  Permission to leave phone message Yes  Some recent data might be hidden     Patient questions:  Do you have a fever, pain , or abdominal swelling? No. Pain Score  0 *  Have you tolerated food without any problems? Yes.    Have you been able to return to your normal activities? Yes.    Do you have any questions about your discharge instructions: Diet   No. Medications  No. Follow up visit  No.  Do you have questions or concerns about your Care? No.  Actions: * If pain score is 4 or above: 1. No action needed, pain <4.Have you developed a fever since your procedure? no  2.   Have you had an respiratory symptoms (SOB or cough) since your procedure? no  3.   Have you tested positive for COVID 19 since your procedure no  4.   Have you had any family members/close contacts diagnosed with the COVID 19 since your procedure?  no   If yes to any of these questions please route to Joylene John, RN and Joella Prince, RN

## 2020-06-09 ENCOUNTER — Encounter: Payer: Self-pay | Admitting: Physician Assistant

## 2020-06-09 DIAGNOSIS — E65 Localized adiposity: Secondary | ICD-10-CM

## 2020-07-13 ENCOUNTER — Institutional Professional Consult (permissible substitution): Payer: BC Managed Care – PPO | Admitting: Plastic Surgery

## 2020-07-20 ENCOUNTER — Other Ambulatory Visit: Payer: Self-pay

## 2020-07-20 ENCOUNTER — Ambulatory Visit (INDEPENDENT_AMBULATORY_CARE_PROVIDER_SITE_OTHER): Payer: 59 | Admitting: Family Medicine

## 2020-07-20 ENCOUNTER — Encounter (HOSPITAL_BASED_OUTPATIENT_CLINIC_OR_DEPARTMENT_OTHER): Payer: Self-pay | Admitting: Family Medicine

## 2020-07-20 VITALS — BP 118/84 | HR 67 | Ht 65.0 in | Wt 256.0 lb

## 2020-07-20 DIAGNOSIS — H6983 Other specified disorders of Eustachian tube, bilateral: Secondary | ICD-10-CM

## 2020-07-20 DIAGNOSIS — H6980 Other specified disorders of Eustachian tube, unspecified ear: Secondary | ICD-10-CM | POA: Insufficient documentation

## 2020-07-20 DIAGNOSIS — J3089 Other allergic rhinitis: Secondary | ICD-10-CM

## 2020-07-20 DIAGNOSIS — Z91030 Bee allergy status: Secondary | ICD-10-CM

## 2020-07-20 MED ORDER — EPINEPHRINE 0.3 MG/0.3ML IJ SOAJ: 0.3000 mg | Freq: Once | INTRAMUSCULAR | Status: DC

## 2020-07-20 NOTE — Assessment & Plan Note (Addendum)
Treatment of underlying symptoms as below If continuing to have ear pain, vertigo, consider evaluation with ENT

## 2020-07-20 NOTE — Assessment & Plan Note (Signed)
Suspect etiology of ongoing symptoms are related to environmental allergies Recommend regular use of allergy medications including Claritin, Flonase, and azelastine Also recommend using Qvar as prescribed given current mild shortness of breath, albuterol as needed Expect gradual improvement and resolution of symptoms If not improving as expected, return to clinic for further evaluation

## 2020-07-20 NOTE — Patient Instructions (Signed)
   Medication Instructions:  Your physician recommends that you continue on your current medications as directed. Please refer to the Current Medication list given to you today. --If you need a refill on any your medications before your next appointment, please call your pharmacy first. If no refills are authorized on file call the office.--  Lab Work: If you have labs (blood work) drawn today and your tests are completely normal, you will receive your results only by: Marland Kitchen MyChart Message (if you have MyChart) OR . A phone call from our staff. Please ensure you check your voicemail in the event that you authorized detailed messages to be left on a delegated number. If you have any lab test that is abnormal or we need to change your treatment, we will call you to review the results.  Follow-Up: Your next appointment:   Your physician recommends that you schedule a follow-up appointment in: as Needed with Dr. De [place]am  We recommend signing up for the patient portal called "MyChart".  Sign up information is provided on this After Visit Summary.  MyChart is used to connect with patients for Virtual Visits (Telemedicine).  Patients are able to view lab/test results, encounter notes, upcoming appointments, etc.  Non-urgent messages can be sent to your provider as well.   To learn more about what you can do with MyChart, go to [email or web address].    Thanks for letting us be apart of your health journey!!  Primary Care and Sports Medicine    Dr. de Guam and Worthy Keeler, DNP, AGNP

## 2020-07-20 NOTE — Progress Notes (Signed)
New Patient Office Visit  Subjective:  Patient ID: Zoe Soto, female    DOB: 1980/11/27  Age: 40 y.o. MRN: [id number]  CC:  Chief Complaint  Patient presents with  . Dizziness    Pt states dizziness presented yesterday. She has a hx of inner ear issues that are exacerbated by seasonal allergies  . Headache  . Shortness of Breath    Dyspnea on exertion. Pt states it started yesterday when she noticed the headache and dizziness. Pt has known cardiac arrhythmia but states she feels this DOE is unrelated.    HPI Zoe Soto is a 40 year old female presenting to establish in clinic.  She has current issues regarding dizziness, headache, bilateral ear pain, mild shortness of breath.  Past medical history significant for seasonal allergies, asthma, GERD.  Also being seen by GI for ongoing bowel issues, evaluation of SIBO, colonoscopy and upper GI endoscopy performed in January 2022.  She reports experiencing dizziness over the past 4 weeks.  Ear pain, headache, mild shortness of breath has been over about the past 1 day.  Headache is primarily frontal.  Did take Tylenol yesterday without significant improvement.  Reports that she tends to have headaches with her allergies.  Otherwise no history of significant headaches.  She denies any nausea, vomiting, numbness, tingling, vision changes.  Presently prescribed albuterol inhaler and Qvar inhaler.  Reports that she does not take the Qvar regularly.  Feels that she uses the albuterol primarily when allergies are affecting her breathing status.  Uses Flonase occasionally, also has Claritin which she uses infrequently.  Requesting refill of EpiPen which she keeps on hand due to her allergy to bees  Past Medical History:  Diagnosis Date  . Allergy    Seasonal  . Asthma   . Bee sting allergy   . GERD (gastroesophageal reflux disease)   . History of chickenpox   . Pneumonia 08/2017   RESOLVED    Past Surgical History:  Procedure  Laterality Date  . ANKLE SURGERY Right 2011  . DILITATION & CURRETTAGE/HYSTROSCOPY WITH NOVASURE ABLATION N/A 06/27/2018   Procedure: DILATATION & CURETTAGE/HYSTEROSCOPY WITH NOVASURE ABLATION;  Surgeon: Tyson Dense, MD;  Location: [facility];  Service: Gynecology;  Laterality: N/A;  . GASTRIC BYPASS  07/2015  . GASTRIC BYPASS    . KNEE ARTHROSCOPY Left 2010  . LAPAROSCOPIC TUBAL LIGATION Bilateral 06/27/2018   Procedure: LAPAROSCOPIC TUBAL LIGATION;  Surgeon: Tyson Dense, MD;  Location: [facility];  Service: Gynecology;  Laterality: Bilateral;  . SMALL BOWEL REPAIR N/A 06/27/2018   Procedure: SMALL BOWEL REPAIR;  Surgeon: Tyson Dense, MD;  Location: [facility];  Service: Gynecology;  Laterality: N/A;    Family History  Problem Relation Age of Onset  . Alcohol abuse Mother   . Heart attack Father 57  . Heart disease Father   . Asthma Brother   . Diabetes Brother   . Pancreatic cancer Maternal Grandmother   . Diabetes Maternal Aunt   . Congenital heart disease Son   . Esophageal cancer Maternal Uncle   . Esophageal cancer Maternal Uncle   . Colon cancer Neg Hx   . Stomach cancer Neg Hx     Social History   Socioeconomic History  . Marital status: Married    Spouse name: Not on file  . Number of children: 2  . Years of education: 67  . Highest education level: Not on file  Occupational History  . Occupation: Administration  Tobacco Use  . Smoking status: Former Research scientist (life sciences)  . Smokeless tobacco: Never Used  Vaping Use  . Vaping Use: Never used  Substance and Sexual Activity  . Alcohol use: Yes    Alcohol/week: 4.0 standard drinks    Types: 4 Glasses of wine per week    Comment: 4 glasses of wine per week   . Drug use: No  . Sexual activity: Not Currently    Birth control/protection: Inserts    Comment: Nuvaring  Other Topics Concern  . Not on file  Social History Narrative  . Not on file    Social Determinants of Health   Financial Resource Strain: Not on file  Food Insecurity: Not on file  Transportation Needs: Not on file  Physical Activity: Not on file  Stress: Not on file  Social Connections: Not on file  Intimate Partner Violence: Not on file    Objective:   Today's Vitals: BP 118/84   Pulse 67   Ht 5\' 5"  (1.651 m)   Wt 256 lb (116.1 kg)   SpO2 98%   BMI 42.60 kg/m   Physical Exam  Pleasant 40 year old female in no acute distress Cardiovascular exam with regular rate and rhythm, no murmurs appreciated Lungs clear to auscultation bilaterally Otoscopic exam reveals clear ear canals, serous fluid behind TMs bilaterally, no significant cerumen, no erythema  Assessment & Plan:   Problem List Items Addressed This Visit      Nervous and Auditory   Eustachian tube dysfunction    Treatment of underlying symptoms as below If continuing to have ear pain, vertigo, consider evaluation with ENT        Other   Environmental and seasonal allergies    Suspect etiology of ongoing symptoms are related to environmental allergies Recommend regular use of allergy medications including Claritin, Flonase, and azelastine Also recommend using Qvar as prescribed given current mild shortness of breath, albuterol as needed Expect gradual improvement and resolution of symptoms If not improving as expected, return to clinic for further evaluation        Other Visit Diagnoses    Bee sting allergy    -  Primary   Relevant Medications   EPINEPHrine (EPI-PEN) injection 0.3 mg (Start on 07/20/2020  2:30 PM)      Outpatient Encounter Medications as of 07/20/2020  Medication Sig  . AMBULATORY NON FORMULARY MEDICATION Probiotic Liquid daily  . azelastine (OPTIVAR) 0.05 % ophthalmic solution 1 drop 2 (two) times daily.  . beclomethasone (QVAR REDIHALER) 80 MCG/ACT inhaler Inhale 1 puff into the lungs 2 (two) times daily.  Marland Kitchen ELDERBERRY PO Take 1 tablet by mouth daily.  Marland Kitchen  FLUTICASONE PROPIONATE, NASAL, NA Place 1-2 sprays into the nose as needed (for allergies).  Marland Kitchen omeprazole (PRILOSEC) 40 MG capsule Take 1 capsule (40 mg total) by mouth in the morning.   Facility-Administered Encounter Medications as of 07/20/2020  Medication  . EPINEPHrine (EPI-PEN) injection 0.3 mg   Spent 45 minutes on this patient encounter, including preparation, chart review, face-to-face counseling with patient and coordination of care, and documentation of encounter  Follow-up: Return if symptoms worsen or fail to improve.   Luvina Poirier J De [place]am, MD

## 2020-09-26 DIAGNOSIS — F32A Depression, unspecified: Secondary | ICD-10-CM | POA: Insufficient documentation

## 2020-09-26 DIAGNOSIS — F419 Anxiety disorder, unspecified: Secondary | ICD-10-CM | POA: Insufficient documentation

## 2020-09-27 DIAGNOSIS — N898 Other specified noninflammatory disorders of vagina: Secondary | ICD-10-CM | POA: Insufficient documentation

## 2020-10-06 ENCOUNTER — Encounter (HOSPITAL_BASED_OUTPATIENT_CLINIC_OR_DEPARTMENT_OTHER): Payer: Self-pay | Admitting: Family Medicine

## 2020-10-06 DIAGNOSIS — J4530 Mild persistent asthma, uncomplicated: Secondary | ICD-10-CM

## 2020-10-07 MED ORDER — EPINEPHRINE 0.3 MG/0.3ML IJ SOAJ: 0.3000 mg | INTRAMUSCULAR | 1 refills | Status: DC | PRN

## 2020-10-07 MED ORDER — QVAR REDIHALER 80 MCG/ACT IN AERB: 1.0000 | INHALATION_SPRAY | Freq: Two times a day (BID) | RESPIRATORY_TRACT | 0 refills | Status: DC

## 2020-10-07 NOTE — Telephone Encounter (Signed)
Received a fax stating that QVAR is not covered by the patients insurance The insurance lists 4 alternatives: 1. Arnuity Elipta, 2. Flovent HFA, 3. Pulmicort Flex, and 4. Flovent Discus I will forward to Dr. de Guam for advisement

## 2020-10-13 MED ORDER — FLUTICASONE PROPIONATE HFA 110 MCG/ACT IN AERO: 1.0000 | INHALATION_SPRAY | Freq: Two times a day (BID) | RESPIRATORY_TRACT | 12 refills | Status: DC

## 2020-10-13 NOTE — Addendum Note (Signed)
Addended by: DE Guam, Kyung Rudd J on: 10/13/2020 05:08 PM   Modules accepted: Orders

## 2020-10-14 ENCOUNTER — Other Ambulatory Visit (HOSPITAL_BASED_OUTPATIENT_CLINIC_OR_DEPARTMENT_OTHER): Payer: Self-pay | Admitting: Family Medicine

## 2020-10-14 MED ORDER — ALBUTEROL SULFATE HFA 108 (90 BASE) MCG/ACT IN AERS: 2.0000 | INHALATION_SPRAY | Freq: Four times a day (QID) | RESPIRATORY_TRACT | 0 refills | Status: DC | PRN

## 2020-10-14 NOTE — Addendum Note (Signed)
Addended by: Campbell Riches on: 10/14/2020 11:42 AM   Modules accepted: Orders

## 2020-10-14 NOTE — Telephone Encounter (Signed)
Contacted patient to make her aware that QVAR RX was denied by her insurance Per Dr. de Guam Flovent HFA inhaler sent in replace of QVAR in compliance with insurance Patient stated she also needed a refill on Albuterol 108, I informed patient that Albuterol was not listed as active medication Per Dr. de [place]am ok to send in Albuterol Patient is aware and agreeable to medication changes and refills being sent to [facility] on file.

## 2020-10-19 MED ORDER — FLOVENT HFA 110 MCG/ACT IN AERO: 2.0000 | INHALATION_SPRAY | Freq: Every day | RESPIRATORY_TRACT | 12 refills | Status: DC | PRN

## 2020-10-19 MED ORDER — PULMICORT FLEXHALER 180 MCG/ACT IN AEPB: 2.0000 | INHALATION_SPRAY | Freq: Every day | RESPIRATORY_TRACT | 12 refills | Status: DC | PRN

## 2020-10-19 NOTE — Addendum Note (Signed)
Addended by: Bobby Rumpf C on: 10/19/2020 11:12 AM   Modules accepted: Orders

## 2020-10-19 NOTE — Telephone Encounter (Signed)
Received a denial for the PA for fluticasone HFA (generic)  Called [employer] to verify pharmacy benefits for the patient and was told to send Brand name Flovent and/or Pulmicort Sent RX for both with a note to pharmacy to dispense whatever is covered.  Reference number [id number] - Candice

## 2020-12-08 ENCOUNTER — Encounter (HOSPITAL_BASED_OUTPATIENT_CLINIC_OR_DEPARTMENT_OTHER): Payer: Self-pay | Admitting: Family Medicine

## 2020-12-21 ENCOUNTER — Ambulatory Visit: Payer: 59 | Admitting: Registered Nurse

## 2020-12-21 ENCOUNTER — Emergency Department (HOSPITAL_BASED_OUTPATIENT_CLINIC_OR_DEPARTMENT_OTHER): Payer: 59 | Admitting: Radiology

## 2020-12-21 ENCOUNTER — Encounter (HOSPITAL_BASED_OUTPATIENT_CLINIC_OR_DEPARTMENT_OTHER): Payer: Self-pay | Admitting: *Deleted

## 2020-12-21 ENCOUNTER — Other Ambulatory Visit: Payer: Self-pay

## 2020-12-21 ENCOUNTER — Emergency Department (HOSPITAL_BASED_OUTPATIENT_CLINIC_OR_DEPARTMENT_OTHER)
Admission: EM | Admit: 2020-12-21 | Discharge: 2020-12-21 | Disposition: A | Discharge status: Home / Self Care | Payer: 59 | Attending: Emergency Medicine | Admitting: Emergency Medicine

## 2020-12-21 DIAGNOSIS — R0789 Other chest pain: Secondary | ICD-10-CM | POA: Insufficient documentation

## 2020-12-21 DIAGNOSIS — J45909 Unspecified asthma, uncomplicated: Secondary | ICD-10-CM | POA: Diagnosis not present

## 2020-12-21 DIAGNOSIS — Z7982 Long term (current) use of aspirin: Secondary | ICD-10-CM | POA: Diagnosis not present

## 2020-12-21 DIAGNOSIS — Z87891 Personal history of nicotine dependence: Secondary | ICD-10-CM | POA: Diagnosis not present

## 2020-12-21 DIAGNOSIS — K219 Gastro-esophageal reflux disease without esophagitis: Secondary | ICD-10-CM | POA: Insufficient documentation

## 2020-12-21 LAB — BASIC METABOLIC PANEL
Anion gap: 12 (ref 5–15)
BUN: 10 mg/dL (ref 6–20)
CO2: 24 mmol/L (ref 22–32)
Calcium: 9.3 mg/dL (ref 8.9–10.3)
Chloride: 105 mmol/L (ref 98–111)
Creatinine, Ser: 0.99 mg/dL (ref 0.44–1.00)
GFR, Estimated: >60 mL/min (ref >60)
Glucose, Bld: 83 mg/dL (ref 70–99)
Potassium: 3.5 mmol/L (ref 3.5–5.1)
Sodium: 141 mmol/L (ref 135–145)

## 2020-12-21 LAB — CBC
HCT: 37.3 % (ref 36.0–46.0)
Hemoglobin: 11.8 g/dL — ABNORMAL LOW (ref 12.0–15.0)
MCH: 23.1 pg — ABNORMAL LOW (ref 26.0–34.0)
MCHC: 31.6 g/dL (ref 30.0–36.0)
MCV: 73.1 fL — ABNORMAL LOW (ref 80.0–100.0)
Platelets: 440 10*3/uL — ABNORMAL HIGH (ref 150–400)
RBC: 5.1 MIL/uL (ref 3.87–5.11)
RDW: 18.9 % — ABNORMAL HIGH (ref 11.5–15.5)
WBC: 5.9 10*3/uL (ref 4.0–10.5)
nRBC: 0 % (ref 0.0–0.2)

## 2020-12-21 LAB — TROPONIN I (HIGH SENSITIVITY)
Troponin I (High Sensitivity): 5 ng/L (ref <18)
Troponin I (High Sensitivity): 5 ng/L (ref <18)

## 2020-12-21 NOTE — ED Triage Notes (Signed)
Pt states she has had chest pain for about a month, between shoulder blades, last 2 weeks moved to left neck, chest and upper arm. No N/V/D No shob

## 2020-12-21 NOTE — ED Provider Notes (Signed)
[facility] Provider Note  CSN: [id number] Arrival date & time: 12/21/20 1052    History Chief Complaint  Patient presents with   Chest Pain    Zoe Soto is a 40 y.o. female with no significant PMH reports she has had chest pain off and on for about a month, L upper chest, tightness, radiates into L arm/neck. Not associated with SOB. Not exertional, no particular provoking or relieving factors. She had an episode of sharp L sided chest pain this morning while driving that was different from previous and so she came to the ED for eval.   Past Medical History:  Diagnosis Date   Allergy    Seasonal   Asthma    Bee sting allergy    GERD (gastroesophageal reflux disease)    History of chickenpox    Pneumonia 08/2017   RESOLVED    Past Surgical History:  Procedure Laterality Date   ANKLE SURGERY Right 2011   DILITATION & CURRETTAGE/HYSTROSCOPY WITH NOVASURE ABLATION N/A 06/27/2018   Procedure: DILATATION & CURETTAGE/HYSTEROSCOPY WITH NOVASURE ABLATION;  Surgeon: Tyson Dense, MD;  Location: [place];  Service: Gynecology;  Laterality: N/A;   GASTRIC BYPASS  07/2015   GASTRIC BYPASS     KNEE ARTHROSCOPY Left 2010   LAPAROSCOPIC TUBAL LIGATION Bilateral 06/27/2018   Procedure: LAPAROSCOPIC TUBAL LIGATION;  Surgeon: Tyson Dense, MD;  Location: [facility];  Service: Gynecology;  Laterality: Bilateral;   SMALL BOWEL REPAIR N/A 06/27/2018   Procedure: SMALL BOWEL REPAIR;  Surgeon: Tyson Dense, MD;  Location: [facility];  Service: Gynecology;  Laterality: N/A;    Family History  Problem Relation Age of Onset   Alcohol abuse Mother    Heart attack Father 40   Heart disease Father    Asthma Brother    Diabetes Brother    Pancreatic cancer Maternal Grandmother    Diabetes Maternal Aunt    Congenital heart disease Son    Esophageal cancer Maternal Uncle    Esophageal  cancer Maternal Uncle    Colon cancer Neg Hx    Stomach cancer Neg Hx     Social History   Tobacco Use   Smoking status: Former   Smokeless tobacco: Never  Scientific laboratory technician Use: Never used  Substance Use Topics   Alcohol use: Yes    Alcohol/week: 4.0 standard drinks    Types: 4 Glasses of wine per week    Comment: 4 glasses of wine per week    Drug use: No     Home Medications Prior to Admission medications   Medication Sig Start Date End Date Taking? Authorizing Provider  aspirin 325 MG tablet Take 325 mg by mouth daily.   Yes [provider]  albuterol (VENTOLIN HFA) 108 (90 Base) MCG/ACT inhaler Inhale 2 puffs into the lungs every 6 (six) hours as needed for wheezing or shortness of breath. 10/14/20   de [place], Blondell Reveal, MD  budesonide (PULMICORT FLEXHALER) 180 MCG/ACT inhaler Inhale 2 puffs into the lungs daily as needed. 10/19/20   de [place], Blondell Reveal, MD  EPINEPHrine 0.3 mg/0.3 mL IJ SOAJ injection Inject 0.3 mg into the muscle as needed for anaphylaxis. 10/07/20   de [place], Raymond J, MD  FLOVENT HFA 110 MCG/ACT inhaler Inhale 2 puffs into the lungs daily as needed. 10/19/20   de [place], Blondell Reveal, MD  FLUTICASONE PROPIONATE, NASAL, NA Place 1-2 sprays into the nose as needed (  for allergies). Patient not taking: Reported on 12/21/2020    [provider]  omeprazole (PRILOSEC) 40 MG capsule Take 1 capsule (40 mg total) by mouth in the morning. Patient not taking: Reported on 12/21/2020 05/19/20   Thornton Park, MD     Allergies    Wasp venom, Naproxen, Beeswax, Other, and Tape   Review of Systems   Review of Systems A comprehensive review of systems was completed and negative except as noted in HPI.    Physical Exam BP (!) 161/86 (BP Location: Right Arm)   Pulse (!) 56   Temp 98.6 F (37 C) (Oral)   Resp 18   Ht '5\' 5"'$  (1.651 m)   Wt 110.2 kg   SpO2 100%   BMI 40.44 kg/m   Physical Exam Vitals and nursing note reviewed.  Constitutional:       Appearance: Normal appearance.  HENT:     Head: Normocephalic and atraumatic.     Nose: Nose normal.     Mouth/Throat:     Mouth: Mucous membranes are moist.  Eyes:     Extraocular Movements: Extraocular movements intact.     Conjunctiva/sclera: Conjunctivae normal.  Cardiovascular:     Rate and Rhythm: Normal rate.  Pulmonary:     Effort: Pulmonary effort is normal.     Breath sounds: Normal breath sounds.  Chest:     Chest wall: Tenderness (L upper chest reproduces pain) present.  Abdominal:     General: Abdomen is flat.     Palpations: Abdomen is soft.     Tenderness: There is no abdominal tenderness.  Musculoskeletal:        General: No swelling. Normal range of motion.     Cervical back: Neck supple.  Skin:    General: Skin is warm and dry.  Neurological:     General: No focal deficit present.     Mental Status: She is alert.  Psychiatric:        Mood and Affect: Mood normal.     ED Results / Procedures / Treatments   Labs (all labs ordered are listed, but only abnormal results are displayed) Labs Reviewed  CBC - Abnormal; Notable for the following components:      Result Value   Hemoglobin 11.8 (*)    MCV 73.1 (*)    MCH 23.1 (*)    RDW 18.9 (*)    Platelets 440 (*)    All other components within normal limits  BASIC METABOLIC PANEL  PREGNANCY, URINE  TROPONIN I (HIGH SENSITIVITY)  TROPONIN I (HIGH SENSITIVITY)    EKG EKG Interpretation  Date/Time:  Wednesday December 21 2020 10:58:50 EDT Ventricular Rate:  72 PR Interval:  174 QRS Duration: 82 QT Interval:  394 QTC Calculation: 431 R Axis:   80 Text Interpretation: Normal sinus rhythm Cannot rule out Anterior infarct , age undetermined Artifact Abnormal ECG No old tracing to compare Confirmed by Calvert Cantor [phone]) on 12/21/2020 11:43:16 AM   Radiology DG Chest 2 View  Result Date: 12/21/2020 CLINICAL DATA:  Chest pain for the past month. EXAM: CHEST - 2 VIEW COMPARISON:  None. FINDINGS:  The heart size and mediastinal contours are within normal limits. Both lungs are clear. The visualized skeletal structures are unremarkable. IMPRESSION: No active cardiopulmonary disease. Electronically Signed   By: Titus Dubin M.D.   On: 12/21/2020 11:55    Procedures Procedures  Medications Ordered in the ED Medications - No data to display   MDM Rules/Calculators/A&P MDM  Patient with atypical pain off and on for several weeks. Chest is tender to palpation. Will check labs, CXR and delta trop.   ED Course  I have reviewed the triage vital signs and the nursing notes.  Pertinent labs & imaging results that were available during my care of the patient were reviewed by me and considered in my medical decision making (see chart for details).  Clinical Course as of 12/21/20 1519  Wed Dec 21, 2020  1207 CXR is clear. CBC, BMP and initial trop are normal.  [CS]  1517 Repeat Trop is neg. Patient remains well appearing. Low risk factor profile, neg Trop x 2. Plan discharge home with PCP follow up.  [CS]    Clinical Course User Index [CS] Truddie Hidden, MD    Final Clinical Impression(s) / ED Diagnoses Final diagnoses:  Atypical chest pain    Rx / DC Orders ED Discharge Orders     None        Truddie Hidden, MD 12/21/20 [phone]

## 2021-01-18 ENCOUNTER — Telehealth (HOSPITAL_BASED_OUTPATIENT_CLINIC_OR_DEPARTMENT_OTHER): Payer: Self-pay

## 2021-01-18 NOTE — Telephone Encounter (Signed)
Patient called and left a voicemail stating that she tested positive for covid on 09/08 Patient is requesting to have "some medication" called into the pharmacy She states she has had covid before and her symptoms are mild compared to the first time she had covid, however she doesn't want things to get that bad so she wants "a medication" to take to prevent progressing symptoms Patient states she feels fine currently Will forward to Dr. Tennis Must Guam for advisement

## 2021-01-20 DIAGNOSIS — Z23 Encounter for immunization: Secondary | ICD-10-CM | POA: Diagnosis not present

## 2021-02-22 DIAGNOSIS — R300 Dysuria: Secondary | ICD-10-CM | POA: Diagnosis not present

## 2021-02-22 DIAGNOSIS — N760 Acute vaginitis: Secondary | ICD-10-CM | POA: Diagnosis not present

## 2021-02-22 DIAGNOSIS — R109 Unspecified abdominal pain: Secondary | ICD-10-CM | POA: Diagnosis not present

## 2021-03-15 ENCOUNTER — Other Ambulatory Visit: Payer: Self-pay

## 2021-03-15 ENCOUNTER — Ambulatory Visit: Payer: 59 | Admitting: Plastic Surgery

## 2021-03-15 ENCOUNTER — Encounter: Payer: Self-pay | Admitting: Plastic Surgery

## 2021-03-15 VITALS — BP 135/86 | HR 85 | Ht 65.0 in | Wt 244.2 lb

## 2021-03-15 DIAGNOSIS — M793 Panniculitis, unspecified: Secondary | ICD-10-CM | POA: Diagnosis not present

## 2021-03-15 DIAGNOSIS — Z411 Encounter for cosmetic surgery: Secondary | ICD-10-CM | POA: Diagnosis not present

## 2021-03-15 NOTE — Progress Notes (Signed)
Referring Provider Kennyth Arnold, FNP [place],  [place] [address]   CC:  Chief Complaint  Patient presents with   Advice Only      Zoe Soto is an 40 y.o. female.  HPI: Patient presents to discuss excess skin after weight loss.  She had gastric bypass done laparoscopically in 2017.  She is lost about 100 pounds.  Her weight has been stable for at least 6 months.  She is bothered by the overhanging skin.  She reports rashes beneath the crease that been refractory to over-the-counter prescription treatments.  She does not smoke and is not a diabetic.  She takes no blood thinners.  She is also interested in contour improvement in the upper abdomen if possible.  No other prior abdominal operations.  Allergies  Allergen Reactions   Wasp Venom Anaphylaxis and Hives   Naproxen Other (See Comments)    Hematuria when taken in large doses   Beeswax    Other     Ants   Tape Rash    ADHESIVE AND PAPER TAPE REMOVES SKIN    Outpatient Encounter Medications as of 03/15/2021  Medication Sig Note   albuterol (VENTOLIN HFA) 108 (90 Base) MCG/ACT inhaler Inhale 2 puffs into the lungs every 6 (six) hours as needed for wheezing or shortness of breath. 12/21/2020: Only as needed   aspirin 325 MG tablet Take 325 mg by mouth daily.    budesonide (PULMICORT FLEXHALER) 180 MCG/ACT inhaler Inhale 2 puffs into the lungs daily as needed. 12/21/2020: Only as needed   EPINEPHrine 0.3 mg/0.3 mL IJ SOAJ injection Inject 0.3 mg into the muscle as needed for anaphylaxis. 12/21/2020: Only as needed   FLOVENT HFA 110 MCG/ACT inhaler Inhale 2 puffs into the lungs daily as needed. 12/21/2020: As on hand   FLUTICASONE PROPIONATE, NASAL, NA Place 1-2 sprays into the nose as needed (for allergies). (Patient not taking: Reported on 12/21/2020)    [DISCONTINUED] omeprazole (PRILOSEC) 40 MG capsule Take 1 capsule (40 mg total) by mouth in the morning. (Patient not taking: No sig reported)    No  facility-administered encounter medications on file as of 03/15/2021.     Past Medical History:  Diagnosis Date   Allergy    Seasonal   Asthma    Bee sting allergy    GERD (gastroesophageal reflux disease)    History of chickenpox    Pneumonia 08/2017   RESOLVED    Past Surgical History:  Procedure Laterality Date   ANKLE SURGERY Right 2011   DILITATION & CURRETTAGE/HYSTROSCOPY WITH NOVASURE ABLATION N/A 06/27/2018   Procedure: DILATATION & CURETTAGE/HYSTEROSCOPY WITH NOVASURE ABLATION;  Surgeon: Tyson Dense, MD;  Location: [facility];  Service: Gynecology;  Laterality: N/A;   GASTRIC BYPASS  07/2015   GASTRIC BYPASS     KNEE ARTHROSCOPY Left 2010   LAPAROSCOPIC TUBAL LIGATION Bilateral 06/27/2018   Procedure: LAPAROSCOPIC TUBAL LIGATION;  Surgeon: Tyson Dense, MD;  Location: [facility];  Service: Gynecology;  Laterality: Bilateral;   SMALL BOWEL REPAIR N/A 06/27/2018   Procedure: SMALL BOWEL REPAIR;  Surgeon: Tyson Dense, MD;  Location: [facility];  Service: Gynecology;  Laterality: N/A;    Family History  Problem Relation Age of Onset   Alcohol abuse Mother    Heart attack Father 24   Heart disease Father    Asthma Brother    Diabetes Brother    Pancreatic cancer Maternal Grandmother  Diabetes Maternal Aunt    Congenital heart disease Son    Esophageal cancer Maternal Uncle    Esophageal cancer Maternal Uncle    Colon cancer Neg Hx    Stomach cancer Neg Hx     Social History   Social History Narrative   Not on file     Review of Systems General: Denies fevers, chills, weight loss CV: Denies chest pain, shortness of breath, palpitations  Physical Exam Vitals with BMI 03/15/2021 12/21/2020 12/21/2020  Height 5\' 5"  - -  Weight 244 lbs 3 oz - -  BMI 50.38 - -  Systolic 882 800 349  Diastolic 86 86 74  Pulse 85 56 65    General:  No acute distress,  Alert and oriented, Non-Toxic,  Normal speech and affect Abdomen: Abdomen is soft nontender.  No obvious hernias.  Well-healed laparoscopic scars.  She has moderate excess skin and adipose tissue in the infra and supraumbilical areas.  Likely has mild diastases.  Assessment/Plan Patient is a good candidate for infraumbilical panniculectomy combined with upper abdominal liposuction and plication.  I explained the lower part should be covered under insurance and the upper part would likely be cosmetic.  We discussed the risks of the procedure that include bleeding, infection, damage to surrounding structures need for additional procedures.  I discussed the location and orientation of the scars.  We discussed the potential for persistent contour irregularities and the limitations of the operation.  She is fully understanding wants to move forward.  [name] 03/15/2021, 5:28 PM

## 2021-04-05 ENCOUNTER — Encounter: Payer: Self-pay | Admitting: Registered Nurse

## 2021-04-05 ENCOUNTER — Ambulatory Visit: Payer: Medicaid Other | Admitting: Registered Nurse

## 2021-04-05 ENCOUNTER — Other Ambulatory Visit: Payer: Self-pay

## 2021-04-05 VITALS — BP 131/88 | HR 72 | Temp 98.2°F | Resp 18 | Ht 65.0 in | Wt 237.6 lb

## 2021-04-05 DIAGNOSIS — B351 Tinea unguium: Secondary | ICD-10-CM

## 2021-04-05 DIAGNOSIS — Z1329 Encounter for screening for other suspected endocrine disorder: Secondary | ICD-10-CM

## 2021-04-05 DIAGNOSIS — J4520 Mild intermittent asthma, uncomplicated: Secondary | ICD-10-CM | POA: Diagnosis not present

## 2021-04-05 DIAGNOSIS — Z13220 Encounter for screening for lipoid disorders: Secondary | ICD-10-CM

## 2021-04-05 DIAGNOSIS — Z13228 Encounter for screening for other metabolic disorders: Secondary | ICD-10-CM

## 2021-04-05 DIAGNOSIS — Z130 Encounter for screening for diseases of the blood and blood-forming organs and certain disorders involving the immune mechanism: Secondary | ICD-10-CM | POA: Diagnosis not present

## 2021-04-05 DIAGNOSIS — T753XXA Motion sickness, initial encounter: Secondary | ICD-10-CM | POA: Diagnosis not present

## 2021-04-05 DIAGNOSIS — Z87892 Personal history of anaphylaxis: Secondary | ICD-10-CM

## 2021-04-05 LAB — CBC WITH DIFFERENTIAL/PLATELET
Basophils Absolute: 0.1 10*3/uL (ref 0.0–0.1)
Basophils Relative: 2 % (ref 0.0–3.0)
Eosinophils Absolute: 0.1 10*3/uL (ref 0.0–0.7)
Eosinophils Relative: 1.7 % (ref 0.0–5.0)
HCT: 35.2 % — ABNORMAL LOW (ref 36.0–46.0)
Hemoglobin: 11.1 g/dL — ABNORMAL LOW (ref 12.0–15.0)
Lymphocytes Relative: 43.2 % (ref 12.0–46.0)
Lymphs Abs: 1.7 10*3/uL (ref 0.7–4.0)
MCHC: 31.4 g/dL (ref 30.0–36.0)
MCV: 70.8 fl — ABNORMAL LOW (ref 78.0–100.0)
Monocytes Absolute: 0.4 10*3/uL (ref 0.1–1.0)
Monocytes Relative: 10 % (ref 3.0–12.0)
Neutro Abs: 1.7 10*3/uL (ref 1.4–7.7)
Neutrophils Relative %: 43.1 % (ref 43.0–77.0)
Platelets: 391 10*3/uL (ref 150.0–400.0)
RBC: 4.97 Mil/uL (ref 3.87–5.11)
RDW: 18.6 % — ABNORMAL HIGH (ref 11.5–15.5)
WBC: 4 10*3/uL (ref 4.0–10.5)

## 2021-04-05 LAB — HEMOGLOBIN A1C: Hgb A1c MFr Bld: 5.7 % (ref 4.6–6.5)

## 2021-04-05 LAB — COMPREHENSIVE METABOLIC PANEL
ALT: 12 U/L (ref 0–35)
AST: 19 U/L (ref 0–37)
Albumin: 4 g/dL (ref 3.5–5.2)
Alkaline Phosphatase: 52 U/L (ref 39–117)
BUN: 14 mg/dL (ref 6–23)
CO2: 28 mEq/L (ref 19–32)
Calcium: 9.2 mg/dL (ref 8.4–10.5)
Chloride: 104 mEq/L (ref 96–112)
Creatinine, Ser: 1.07 mg/dL (ref 0.40–1.20)
GFR: 64.16 mL/min (ref >60.00)
Glucose, Bld: 81 mg/dL (ref 70–99)
Potassium: 4 mEq/L (ref 3.5–5.1)
Sodium: 139 mEq/L (ref 135–145)
Total Bilirubin: 0.2 mg/dL (ref 0.2–1.2)
Total Protein: 7.8 g/dL (ref 6.0–8.3)

## 2021-04-05 LAB — LIPID PANEL
Cholesterol: 151 mg/dL (ref 0–200)
HDL: 61.7 mg/dL (ref >39.00)
LDL Cholesterol: 75 mg/dL (ref 0–99)
NonHDL: 89.44
Total CHOL/HDL Ratio: 2
Triglycerides: 71 mg/dL (ref 0.0–149.0)
VLDL: 14.2 mg/dL (ref 0.0–40.0)

## 2021-04-05 LAB — TSH: TSH: 1.9 u[IU]/mL (ref 0.35–5.50)

## 2021-04-05 MED ORDER — PULMICORT FLEXHALER 180 MCG/ACT IN AEPB: 2.0000 | INHALATION_SPRAY | Freq: Every day | RESPIRATORY_TRACT | 12 refills | Status: AC | PRN

## 2021-04-05 MED ORDER — ONDANSETRON 4 MG PO TBDP: 4.0000 mg | ORAL_TABLET | Freq: Three times a day (TID) | ORAL | 0 refills | Status: DC | PRN

## 2021-04-05 MED ORDER — SCOPOLAMINE 1 MG/3DAYS TD PT72: 1.0000 | MEDICATED_PATCH | TRANSDERMAL | 10 refills | Status: DC

## 2021-04-05 MED ORDER — PROMETHAZINE HCL 12.5 MG PO TABS: 12.5000 mg | ORAL_TABLET | Freq: Three times a day (TID) | ORAL | 0 refills | Status: DC | PRN

## 2021-04-05 MED ORDER — LORAZEPAM 0.5 MG PO TABS: 0.5000 mg | ORAL_TABLET | Freq: Two times a day (BID) | ORAL | 0 refills | Status: DC | PRN

## 2021-04-05 MED ORDER — EPINEPHRINE 0.3 MG/0.3ML IJ SOAJ: 0.3000 mg | INTRAMUSCULAR | 1 refills | Status: DC | PRN

## 2021-04-05 MED ORDER — FLOVENT HFA 110 MCG/ACT IN AERO: 2.0000 | INHALATION_SPRAY | Freq: Every day | RESPIRATORY_TRACT | 12 refills | Status: DC | PRN

## 2021-04-05 MED ORDER — CICLOPIROX OLAMINE 0.77 % EX CREA: TOPICAL_CREAM | Freq: Two times a day (BID) | CUTANEOUS | 0 refills | Status: DC

## 2021-04-05 MED ORDER — ALBUTEROL SULFATE HFA 108 (90 BASE) MCG/ACT IN AERS: 2.0000 | INHALATION_SPRAY | Freq: Four times a day (QID) | RESPIRATORY_TRACT | 0 refills | Status: DC | PRN

## 2021-04-05 NOTE — Progress Notes (Signed)
Established Patient Office Visit  Subjective:  Patient ID: Zoe Soto, female    DOB: [birth year]  Age: 40 y.o. MRN: [id number]  CC:  Chief Complaint  Patient presents with   Transitions Of Care    Patient states she is here for a TOC . Patient states she would like to discuss her thumb with a black spot and some medication for a cruise.    HPI Zoe Soto presents for [name]  Formerly pt of Dr. [place]  Histories reviewed and updated with patient.   Thumb Green spot at end of nail. Ongoing for a few weeks. Has soaked in listerine. Some effect.  Interested in treatment.  Steady, not spreading Has not happened before Washes hands frequently but no consistent water exposure.  Cruise Hx of motion sickness in cars, planes, and boats.  Has not used medication for this in the past Upcoming cruise, does not want to be sick Interested in options.   Past Medical History:  Diagnosis Date   Allergy    Seasonal   Asthma    Bee sting allergy    GERD (gastroesophageal reflux disease)    History of chickenpox    Pneumonia 08/2017   RESOLVED    Past Surgical History:  Procedure Laterality Date   ANKLE SURGERY Right 2011   DILITATION & CURRETTAGE/HYSTROSCOPY WITH NOVASURE ABLATION N/A 06/27/2018   Procedure: DILATATION & CURETTAGE/HYSTEROSCOPY WITH NOVASURE ABLATION;  Surgeon: Tyson Dense, MD;  Location: [facility];  Service: Gynecology;  Laterality: N/A;   GASTRIC BYPASS  07/2015   GASTRIC BYPASS     KNEE ARTHROSCOPY Left 2010   LAPAROSCOPIC TUBAL LIGATION Bilateral 06/27/2018   Procedure: LAPAROSCOPIC TUBAL LIGATION;  Surgeon: Tyson Dense, MD;  Location: [facility];  Service: Gynecology;  Laterality: Bilateral;   SMALL BOWEL REPAIR N/A 06/27/2018   Procedure: SMALL BOWEL REPAIR;  Surgeon: Tyson Dense, MD;  Location: [facility];  Service: Gynecology;  Laterality: N/A;    Family History  Problem  Relation Age of Onset   Alcohol abuse Mother    Heart attack Father 44   Heart disease Father    Asthma Brother    Diabetes Brother    Pancreatic cancer Maternal Grandmother    Diabetes Maternal Aunt    Congenital heart disease Son    Esophageal cancer Maternal Uncle    Esophageal cancer Maternal Uncle    Colon cancer Neg Hx    Stomach cancer Neg Hx     Social History   Socioeconomic History   Marital status: Widowed    Spouse name: Not on file   Number of children: 2   Years of education: 64   Highest education level: Not on file  Occupational History   Occupation: Administration  Tobacco Use   Smoking status: Former   Smokeless tobacco: Never  Scientific laboratory technician Use: Never used  Substance and Sexual Activity   Alcohol use: Yes    Alcohol/week: 4.0 standard drinks    Types: 4 Glasses of wine per week    Comment: 4 glasses of wine per week    Drug use: No   Sexual activity: Not Currently    Birth control/protection: Inserts    Comment: Nuvaring  Other Topics Concern   Not on file  Social History Narrative   Not on file   Social Determinants of Health   Financial Resource Strain: Not on file  Food Insecurity: Not on file  Transportation Needs: Not on file  Physical Activity: Not on file  Stress: Not on file  Social Connections: Not on file  Intimate Partner Violence: Not on file    Outpatient Medications Prior to Visit  Medication Sig Dispense Refill   FLUTICASONE PROPIONATE, NASAL, NA Place 1-2 sprays into the nose as needed (for allergies).     albuterol (VENTOLIN HFA) 108 (90 Base) MCG/ACT inhaler Inhale 2 puffs into the lungs every 6 (six) hours as needed for wheezing or shortness of breath. 8 g 0   budesonide (PULMICORT FLEXHALER) 180 MCG/ACT inhaler Inhale 2 puffs into the lungs daily as needed. 1 each 12   EPINEPHrine 0.3 mg/0.3 mL IJ SOAJ injection Inject 0.3 mg into the muscle as needed for anaphylaxis. 1 each 1   FLOVENT HFA 110 MCG/ACT inhaler  Inhale 2 puffs into the lungs daily as needed. 1 each 12   aspirin 325 MG tablet Take 325 mg by mouth daily.     No facility-administered medications prior to visit.    Allergies  Allergen Reactions   Wasp Venom Anaphylaxis and Hives   Naproxen Other (See Comments)    Hematuria when taken in large doses   Beeswax    Other     Ants   Tape Rash    ADHESIVE AND PAPER TAPE REMOVES SKIN    ROS Review of Systems  Constitutional: Negative.   HENT: Negative.    Eyes: Negative.   Respiratory: Negative.    Cardiovascular: Negative.   Gastrointestinal: Negative.   Endocrine: Negative.   Genitourinary: Negative.   Musculoskeletal: Negative.   Skin: Negative.   Neurological: Negative.   Psychiatric/Behavioral: Negative.    All other systems reviewed and are negative.    Objective:    Physical Exam Vitals and nursing note reviewed.  Constitutional:      General: She is not in acute distress.    Appearance: Normal appearance. She is normal weight. She is not ill-appearing, toxic-appearing or diaphoretic.  Cardiovascular:     Rate and Rhythm: Normal rate and regular rhythm.     Heart sounds: Normal heart sounds. No murmur heard.   No friction rub. No gallop.  Pulmonary:     Effort: Pulmonary effort is normal. No respiratory distress.     Breath sounds: Normal breath sounds. No stridor. No wheezing, rhonchi or rales.  Chest:     Chest wall: No tenderness.  Skin:    General: Skin is warm and dry.  Neurological:     General: No focal deficit present.     Mental Status: She is alert and oriented to person, place, and time. Mental status is at baseline.  Psychiatric:        Mood and Affect: Mood normal.        Behavior: Behavior normal.        Thought Content: Thought content normal.        Judgment: Judgment normal.    BP 131/88   Pulse 72   Temp 98.2 F (36.8 C) (Temporal)   Resp 18   Ht 5\' 5"  (1.651 m)   Wt 237 lb 9.6 oz (107.8 kg)   SpO2 97%   BMI 39.54 kg/m   Wt Readings from Last 3 Encounters:  04/05/21 237 lb 9.6 oz (107.8 kg)  03/15/21 244 lb 3.2 oz (110.8 kg)  12/21/20 243 lb (110.2 kg)     Health Maintenance Due  Topic Date Due   Pneumococcal Vaccine 18-11 Years old (1 - PCV) Never  done   TETANUS/TDAP  Never done   PAP SMEAR-Modifier  12/06/2019   INFLUENZA VACCINE  12/05/2020    There are no preventive care reminders to display for this patient.  Lab Results  Component Value Date   TSH 1.88 01/26/2019   Lab Results  Component Value Date   WBC 5.9 12/21/2020   HGB 11.8 (L) 12/21/2020   HCT 37.3 12/21/2020   MCV 73.1 (L) 12/21/2020   PLT 440 (H) 12/21/2020   Lab Results  Component Value Date   NA 141 12/21/2020   K 3.5 12/21/2020   CO2 24 12/21/2020   GLUCOSE 83 12/21/2020   BUN 10 12/21/2020   CREATININE 0.99 12/21/2020   CALCIUM 9.3 12/21/2020   ANIONGAP 12 12/21/2020   GFR 78.79 01/26/2019   No results found for: CHOL No results found for: HDL No results found for: LDLCALC No results found for: TRIG No results found for: CHOLHDL No results found for: HGBA1C    Assessment & Plan:   Problem List Items Addressed This Visit       Respiratory   Asthma   Relevant Medications   albuterol (VENTOLIN HFA) 108 (90 Base) MCG/ACT inhaler   budesonide (PULMICORT FLEXHALER) 180 MCG/ACT inhaler   FLOVENT HFA 110 MCG/ACT inhaler   Other Visit Diagnoses     History of anaphylaxis    -  Primary   Relevant Medications   EPINEPHrine 0.3 mg/0.3 mL IJ SOAJ injection   Screening for endocrine, metabolic and immunity disorder       Relevant Orders   CBC with Differential/Platelet   Comprehensive metabolic panel   Hemoglobin A1c   TSH   Lipid screening       Relevant Orders   Lipid panel   Motion sickness, initial encounter       Relevant Medications   scopolamine (TRANSDERM-SCOP) 1 MG/3DAYS   LORazepam (ATIVAN) 0.5 MG tablet   promethazine (PHENERGAN) 12.5 MG tablet   ondansetron (ZOFRAN-ODT) 4 MG  disintegrating tablet   Onychomycosis       Relevant Medications   ciclopirox (LOPROX) 0.77 % cream       Meds ordered this encounter  Medications   albuterol (VENTOLIN HFA) 108 (90 Base) MCG/ACT inhaler    Sig: Inhale 2 puffs into the lungs every 6 (six) hours as needed for wheezing or shortness of breath.    Dispense:  8 g    Refill:  0    Order Specific Question:   Supervising Provider    Answer:   Carlota Raspberry, JEFFREY R [[id number]]   budesonide (PULMICORT FLEXHALER) 180 MCG/ACT inhaler    Sig: Inhale 2 puffs into the lungs daily as needed.    Dispense:  1 each    Refill:  12    Order Specific Question:   Supervising Provider    Answer:   Carlota Raspberry, JEFFREY R [[id number]]   FLOVENT HFA 110 MCG/ACT inhaler    Sig: Inhale 2 puffs into the lungs daily as needed.    Dispense:  1 each    Refill:  12    Order Specific Question:   Supervising Provider    Answer:   Carlota Raspberry, JEFFREY R [[id number]]   EPINEPHrine 0.3 mg/0.3 mL IJ SOAJ injection    Sig: Inject 0.3 mg into the muscle as needed for anaphylaxis.    Dispense:  1 each    Refill:  1    Order Specific Question:   Supervising Provider    Answer:   Merri Ray R [  [id number]]   scopolamine (TRANSDERM-SCOP) 1 MG/3DAYS    Sig: Place 1 patch (1.5 mg total) onto the skin every 3 (three) days.    Dispense:  12 patch    Refill:  10    Order Specific Question:   Supervising Provider    Answer:   Carlota Raspberry, JEFFREY R [[id number]]   LORazepam (ATIVAN) 0.5 MG tablet    Sig: Take 1 tablet (0.5 mg total) by mouth 2 (two) times daily as needed for anxiety, sedation or sleep (motion sickness).    Dispense:  8 tablet    Refill:  0    Order Specific Question:   Supervising Provider    Answer:   Carlota Raspberry, JEFFREY R [[id number]]   promethazine (PHENERGAN) 12.5 MG tablet    Sig: Take 1 tablet (12.5 mg total) by mouth every 8 (eight) hours as needed for nausea or vomiting.    Dispense:  20 tablet    Refill:  0    Order Specific Question:   Supervising Provider    Answer:   Carlota Raspberry,  JEFFREY R [[id number]]   ondansetron (ZOFRAN-ODT) 4 MG disintegrating tablet    Sig: Take 1 tablet (4 mg total) by mouth every 8 (eight) hours as needed for nausea or vomiting.    Dispense:  20 tablet    Refill:  0    Order Specific Question:   Supervising Provider    Answer:   Carlota Raspberry, JEFFREY R [[id number]]   ciclopirox (LOPROX) 0.77 % cream    Sig: Apply topically 2 (two) times daily.    Dispense:  15 g    Refill:  0    Order Specific Question:   Supervising Provider    Answer:   Carlota Raspberry, JEFFREY R [[id number]]    Follow-up: Return in about 6 months (around 10/03/2021) for CPE and labs.   PLAN Appears as fungal infection of nail. Will treat as above with ciclopirox. If no improvement can consider green nail and tx as pseudomonas infection. Given a variety of options for motion sickness. Discussed risks, benefits, and AE of each.  Labs collected. Will follow up with the patient as warranted. Return in 2023 for CPE and labs Patient encouraged to call clinic with any questions, comments, or concerns.  Maximiano Coss, NP

## 2021-04-05 NOTE — Patient Instructions (Addendum)
Ms. Zoe Soto to meet you!  Have sent a few options for motion sickness: Scopolamine patch - recommend this as the daily driver. Use one patch every 3 days. Ondansetron - next step for any breakthrough [place] Phenergan - may be mildly sedative, can use before bed. Lorazepam - more potent. Not  ideal for long term use. Use in a pinch if you have to. Will be sedative. Do not take with alcohol.  I have sent ciclopirox to apply twice daily to the nail. If no changes within 1-2 weeks, call me  Labs today will be back today or tomorrow. I'll call with any concerns, otherwise, notes will be on MyChart.  Thank you!  Rich     If you have lab work done today you will be contacted with your lab results within the next 2 weeks.  If you have not heard from [place] then please contact us. The fastest way to get your results is to register for My Chart.   IF you received an x-ray today, you will receive an invoice from [facility]. Please contact [facility] at [phone] with questions or concerns regarding your invoice.   IF you received labwork today, you will receive an invoice from [facility]. Please contact [facility] at [phone] with questions or concerns regarding your invoice.   Our billing staff will not be able to assist you with questions regarding bills from these companies.  You will be contacted with the lab results as soon as they are available. The fastest way to get your results is to activate your My Chart account. Instructions are located on the last page of this paperwork. If you have not heard from [place] regarding the results in 2 weeks, please contact this office.

## 2021-04-11 ENCOUNTER — Telehealth: Payer: Self-pay | Admitting: Plastic Surgery

## 2021-04-11 NOTE — Telephone Encounter (Signed)
Received call from patient's insurance requesting additional clinical information about rash. [place] wanted to know what treatments had been done, how long and were there any improvements. Caryl Comes advised that this information could be uploaded to patient's case in the portal or faxed to [phone]. Called patient to request this information. Patient informed me that she has used coconut oil, african oil, tea tree oil and powders over the counter over the course of years with no improvement. She advised that she spoke to Dr. Elyn Aquas about this as well and he gave her steps to try. I didn't find any documentation in her chart. Uploaded the forms of treatment communicated by patient to [employer]'s portal.

## 2021-05-03 ENCOUNTER — Institutional Professional Consult (permissible substitution): Payer: 59 | Admitting: Plastic Surgery

## 2021-05-12 ENCOUNTER — Encounter: Payer: Self-pay | Admitting: Plastic Surgery

## 2021-05-15 NOTE — Progress Notes (Signed)
Patient ID: Zoe Soto, female    DOB: 1981/02/23, 41 y.o.   MRN: [id number]  Chief Complaint  Patient presents with   Pre-op Exam      ICD-10-CM   1. Panniculitis  M79.3     2. Encounter for cosmetic procedure  Z41.1        History of Present Illness: Zoe Soto is a 41 y.o.  female  with a history of panniculitis and excess abdominal skin.  She presents for preoperative evaluation for upcoming procedure, panniculectomy combined with upper abdominal liposuction and plication, scheduled for 06/05/2021 with Dr. Claudia Desanctis.  The patient has not had problems with anesthesia.  She does however report a tape allergy/dermatitis.  She would be okay with Steri-Strip placement.  She cannot take NSAIDs due to bypass surgery.  She is not on any birth control or hormone replacement.  She denies any personal or family history of blood clots or clotting disorder.  No personal history of cancer.  She is looking forward to having her surgery.  She understands that while we will be able to address excess tissue laterally, we will not be able to target her flanks.  She has a history of allergy induced asthma, very well controlled.  Has used her rescue inhaler maybe once or twice in the past year.  She is currently in school full-time and has a 99 year old daughter at home.  Summary of Previous Visit: Patient was seen for consult 03/15/2021.  At that time, she complained of excess skin after significant weight loss.  Ever since her gastric bypass, she is lost approximately 100 pounds.  She complains that the skin crease has led to rashes refractory to medical management.  She also inquired about contour improvement in upper abdomen.  Plan discussed was for infraumbilical panniculectomy, likely be covered by insurance, in conjunction with upper abdominal liposuction and plication that would be cosmetic and patient paid.  Patient voiced understanding and was agreeable to plan.  Job: Charity fundraiser.  PMH  Significant for: Gastric bypass surgery 2017, laparoscopic tubal ligation 2993 complicated by small bowel thermal injury without perforation, palpitations (likely due to stress/anxiety) and has been cardiac cleared.   Past Medical History: Allergies: Allergies  Allergen Reactions   Wasp Venom Anaphylaxis and Hives   Naproxen Other (See Comments)    Hematuria when taken in large doses   Beeswax    Other     Ants   Tape Rash    ADHESIVE AND PAPER TAPE REMOVES SKIN    Current Medications:  Current Outpatient Medications:    albuterol (VENTOLIN HFA) 108 (90 Base) MCG/ACT inhaler, Inhale 2 puffs into the lungs every 6 (six) hours as needed for wheezing or shortness of breath., Disp: 8 g, Rfl: 0   budesonide (PULMICORT FLEXHALER) 180 MCG/ACT inhaler, Inhale 2 puffs into the lungs daily as needed., Disp: 1 each, Rfl: 12   ciclopirox (LOPROX) 0.77 % cream, Apply topically 2 (two) times daily., Disp: 15 g, Rfl: 0   EPINEPHrine 0.3 mg/0.3 mL IJ SOAJ injection, Inject 0.3 mg into the muscle as needed for anaphylaxis., Disp: 1 each, Rfl: 1   FLOVENT HFA 110 MCG/ACT inhaler, Inhale 2 puffs into the lungs daily as needed., Disp: 1 each, Rfl: 12   FLUTICASONE PROPIONATE, NASAL, NA, Place 1-2 sprays into the nose as needed (for allergies)., Disp: , Rfl:    LORazepam (ATIVAN) 0.5 MG tablet, Take 1 tablet (0.5 mg total) by mouth 2 (two) times daily as needed  for anxiety, sedation or sleep (motion sickness)., Disp: 8 tablet, Rfl: 0  Past Medical Problems: Past Medical History:  Diagnosis Date   Allergy    Seasonal   Asthma    Bee sting allergy    GERD (gastroesophageal reflux disease)    History of chickenpox    Pneumonia 08/2017   RESOLVED    Past Surgical History: Past Surgical History:  Procedure Laterality Date   ANKLE SURGERY Right 2011   DILITATION & CURRETTAGE/HYSTROSCOPY WITH NOVASURE ABLATION N/A 06/27/2018   Procedure: DILATATION & CURETTAGE/HYSTEROSCOPY WITH NOVASURE ABLATION;   Surgeon: Tyson Dense, MD;  Location: [facility];  Service: Gynecology;  Laterality: N/A;   GASTRIC BYPASS  07/2015   GASTRIC BYPASS     KNEE ARTHROSCOPY Left 2010   LAPAROSCOPIC TUBAL LIGATION Bilateral 06/27/2018   Procedure: LAPAROSCOPIC TUBAL LIGATION;  Surgeon: Tyson Dense, MD;  Location: [facility];  Service: Gynecology;  Laterality: Bilateral;   SMALL BOWEL REPAIR N/A 06/27/2018   Procedure: SMALL BOWEL REPAIR;  Surgeon: Tyson Dense, MD;  Location: [facility];  Service: Gynecology;  Laterality: N/A;    Social History: Social History   Socioeconomic History   Marital status: Widowed    Spouse name: Not on file   Number of children: 2   Years of education: 57   Highest education level: Not on file  Occupational History   Occupation: Administration  Tobacco Use   Smoking status: Former   Smokeless tobacco: Never  Scientific laboratory technician Use: Never used  Substance and Sexual Activity   Alcohol use: Yes    Alcohol/week: 4.0 standard drinks    Types: 4 Glasses of wine per week    Comment: 4 glasses of wine per week    Drug use: No   Sexual activity: Not Currently    Birth control/protection: Inserts    Comment: Nuvaring  Other Topics Concern   Not on file  Social History Narrative   Not on file   Social Determinants of Health   Financial Resource Strain: Not on file  Food Insecurity: Not on file  Transportation Needs: Not on file  Physical Activity: Not on file  Stress: Not on file  Social Connections: Not on file  Intimate Partner Violence: Not on file    Family History: Family History  Problem Relation Age of Onset   Alcohol abuse Mother    Heart attack Father 60   Heart disease Father    Asthma Brother    Diabetes Brother    Pancreatic cancer Maternal Grandmother    Diabetes Maternal Aunt    Congenital heart disease Son    Esophageal cancer Maternal Uncle    Esophageal cancer  Maternal Uncle    Colon cancer Neg Hx    Stomach cancer Neg Hx     Review of Systems: ROS Denies recent illness, infection, fevers, difficulty breathing, chest pain, or other symptoms.  Physical Exam: Vital Signs BP 139/88 (BP Location: Left Arm, Patient Position: Sitting, Cuff Size: Large)    Pulse 70    Ht 5\' 5"  (1.651 m)    Wt 243 lb 3.2 oz (110.3 kg)    SpO2 100%    BMI 40.47 kg/m   Physical Exam Constitutional:      General: Not in acute distress.    Appearance: Normal appearance. Not ill-appearing.  HENT:     Head: Normocephalic and atraumatic.  Eyes:     Pupils: Pupils are equal,  round. Cardiovascular:     Rate and Rhythm: Normal rate.    Pulses: Normal pulses.  Pulmonary:     Effort: No respiratory distress or increased work of breathing.  Speaks in full sentences. Abdominal:     General: Abdomen is flat. No distension.   Musculoskeletal: Normal range of motion.  Ambulatory. Skin:    General: Skin is warm and dry.     Findings: No erythema or rash.  Neurological:     Mental Status: Alert and oriented to person, place, and time.  Psychiatric:        Mood and Affect: Mood normal.        Behavior: Behavior normal.    Assessment/Plan: The patient is scheduled for panniculectomy with upper abdominal liposuction and plication with Dr. Claudia Desanctis.  Risks, benefits, and alternatives of procedure discussed, questions answered and consent obtained.    Smoking Status: Non-smoker.  Caprini Score: 5; Risk Factors include: Age, BMI greater than 40, and length of planned surgery. Recommendation for mechanical prophylaxis. Encourage early ambulation.   Pictures obtained: 03/15/2021  Post-op Rx sent to pharmacy: Norco.  She tells me that she has taken it before, no issues.  She has ample antiemetics at home.  Patient was provided with the General Surgical Risk consent document and Pain Medication Agreement prior to their appointment.  They had adequate time to read through the risk  consent documents and Pain Medication Agreement. We also discussed them in person together during this preop appointment. All of their questions were answered to their satisfaction.  Recommended calling if they have any further questions.  Risk consent form and Pain Medication Agreement to be scanned into patient's chart.  The risk that can be encountered for this procedure were discussed and include the following but not limited to these: asymmetry, fluid accumulation, firmness of the tissue, skin loss, decrease or no sensation, fat necrosis, bleeding, infection, healing delay.  Deep vein thrombosis, cardiac and pulmonary complications are risks to any procedure.  There are risks of anesthesia, changes to skin sensation and injury to nerves or blood vessels.  The muscle can be temporarily or permanently injured.  You may have an allergic reaction to tape, suture, glue, blood products which can result in skin discoloration, swelling, pain, skin lesions, poor healing.  Any of these can lead to the need for revisonal surgery or stage procedures.  Weight gain and weigh loss can also effect the long term appearance. The results are not guaranteed to last a lifetime.  Future surgery may be required.    The risks that can be encountered with and after liposuction were discussed and include the following but no limited to these: Asymmetry, fluid accumulation, firmness of the area, fat necrosis with death of fat tissue, bleeding, infection, delayed healing, anesthesia risks, skin sensation changes, injury to structures including nerves, blood vessels, and muscles which may be temporary or permanent, allergies to tape, suture materials and glues, blood products, topical preparations or injected agents, skin and contour irregularities, skin discoloration and swelling, deep vein thrombosis, cardiac and pulmonary complications, pain, which may persist, persistent pain, recurrence of the lesion, poor healing of the incision,  possible need for revisional surgery or staged procedures. Thiere can also be persistent swelling, poor wound healing, rippling or loose skin, worsening of cellulite, swelling, and thermal burn or heat injury from ultrasound with the ultrasound-assisted lipoplasty technique. Any change in weight fluctuations can alter the outcome.    Electronically signed by: Krista Blue, PA-C 05/17/2021 10:37  AM

## 2021-05-17 ENCOUNTER — Ambulatory Visit (INDEPENDENT_AMBULATORY_CARE_PROVIDER_SITE_OTHER): Payer: Medicaid Other | Admitting: Physician Assistant

## 2021-05-17 ENCOUNTER — Other Ambulatory Visit: Payer: Self-pay

## 2021-05-17 VITALS — BP 139/88 | HR 70 | Ht 65.0 in | Wt 243.2 lb

## 2021-05-17 DIAGNOSIS — Z411 Encounter for cosmetic surgery: Secondary | ICD-10-CM

## 2021-05-17 DIAGNOSIS — Z719 Counseling, unspecified: Secondary | ICD-10-CM

## 2021-05-17 DIAGNOSIS — M793 Panniculitis, unspecified: Secondary | ICD-10-CM

## 2021-05-17 MED ORDER — HYDROCODONE-ACETAMINOPHEN 5-325 MG PO TABS: 1.0000 | ORAL_TABLET | Freq: Four times a day (QID) | ORAL | 0 refills | Status: AC | PRN

## 2021-06-01 ENCOUNTER — Telehealth: Payer: Medicaid Other | Admitting: Family Medicine

## 2021-06-01 DIAGNOSIS — B3731 Acute candidiasis of vulva and vagina: Secondary | ICD-10-CM | POA: Diagnosis not present

## 2021-06-01 MED ORDER — FLUCONAZOLE 150 MG PO TABS: 150.0000 mg | ORAL_TABLET | Freq: Once | ORAL | 0 refills | Status: AC

## 2021-06-01 NOTE — Progress Notes (Signed)

## 2021-06-05 ENCOUNTER — Other Ambulatory Visit: Payer: Self-pay | Admitting: Plastic Surgery

## 2021-06-05 DIAGNOSIS — L905 Scar conditions and fibrosis of skin: Secondary | ICD-10-CM | POA: Diagnosis not present

## 2021-06-05 DIAGNOSIS — M793 Panniculitis, unspecified: Secondary | ICD-10-CM | POA: Diagnosis not present

## 2021-06-07 ENCOUNTER — Telehealth: Payer: Self-pay

## 2021-06-07 NOTE — Telephone Encounter (Signed)
Patient called to say she had surgery on Monday with Dr. Claudia Desanctis and she has questions regarding her post-surgical care.  She said she would like to know how to clean herself and how long she needs to leave the binder on.  Patient said she does have drainage, but it's a smaller amount each time.  She said there are 35 mL in the bulb now.  Patient also said that her sister is concerned because she sees blood in the patient's tubes.  Patient said that she had a fever of 100.4 and now her temperature is 99.1, she said she is not sure whether or not she needs an antibiotic.  Patient said we prescribed her some pain medication but no antibiotic because she thought she already had one at home, but now she can't find it.  She said if we think she needs an antibiotic, her preferred pharmacy is [facility] in [place].  Please call.

## 2021-06-07 NOTE — Telephone Encounter (Signed)
Returned patients call. Panniculectomy was performed on 06/05/2021 with Dr. Claudia Desanctis. Advised she is okay to take a shower after 3 days from surgery, let soap run down shoulders, place binder back on and wear 24-7 other when taking a shower. May wear a tank top underneath to avoid any skin irritation. Instructed how to properly clear tubing correctly. Patient understood with no further questions.

## 2021-06-08 ENCOUNTER — Telehealth: Payer: Self-pay

## 2021-06-08 ENCOUNTER — Encounter: Payer: Self-pay | Admitting: Plastic Surgery

## 2021-06-08 NOTE — Telephone Encounter (Signed)
Patient called to say she just showered and her dressing is really wet.  She would like to know what she should do. Please call.

## 2021-06-08 NOTE — Telephone Encounter (Signed)
Spoke with patient. She was concerned because her gauze and tape were extremely wet after her shower. I told her, since she did not have tape or gauze, to use a maxi pad until she could get to the store and buy some medipore or paper tape and some 4x4s to dress the area. Pt conveyed understanding.

## 2021-06-13 ENCOUNTER — Encounter: Payer: Self-pay | Admitting: Plastic Surgery

## 2021-06-14 ENCOUNTER — Ambulatory Visit (INDEPENDENT_AMBULATORY_CARE_PROVIDER_SITE_OTHER): Payer: Medicaid Other | Admitting: Plastic Surgery

## 2021-06-14 ENCOUNTER — Other Ambulatory Visit: Payer: Self-pay

## 2021-06-14 DIAGNOSIS — Z411 Encounter for cosmetic surgery: Secondary | ICD-10-CM

## 2021-06-14 DIAGNOSIS — M793 Panniculitis, unspecified: Secondary | ICD-10-CM

## 2021-06-14 NOTE — Progress Notes (Signed)
Patient presents 1 week postop from infraumbilical panniculectomy combined with upper abdominal liposuction and plication.  She feels like things are coming reasonably well.  Each drain is putting out around 30 cc/day but the output seems to be decreasing.  I remove the left-sided drain today because that was more symptomatic for her.  Otherwise her incision looks to be healing nicely.  She has much improved shape and contour.  Umbilicus looks viable.  We will plan to see her next week and hopefully remove the other drain.  All of her questions were answered.

## 2021-06-21 ENCOUNTER — Ambulatory Visit: Payer: Medicaid Other | Admitting: Plastic Surgery

## 2021-06-21 ENCOUNTER — Ambulatory Visit (INDEPENDENT_AMBULATORY_CARE_PROVIDER_SITE_OTHER): Payer: Medicaid Other | Admitting: Plastic Surgery

## 2021-06-21 ENCOUNTER — Other Ambulatory Visit: Payer: Self-pay

## 2021-06-21 DIAGNOSIS — M793 Panniculitis, unspecified: Secondary | ICD-10-CM

## 2021-06-21 NOTE — Progress Notes (Signed)
Patient presents 2 weeks out from infraumbilical panniculectomy combined with upper abdominal liposuction and plication.  Her right sided drain is putting out very little and was removed today.  Her incision continues to look good and umbilicus looks viable.  She is happy with her shape.  We will plan to see her again in 3 weeks.  All of her questions were answered.

## 2021-06-25 ENCOUNTER — Telehealth (INDEPENDENT_AMBULATORY_CARE_PROVIDER_SITE_OTHER): Payer: Medicaid Other | Admitting: Plastic Surgery

## 2021-06-25 DIAGNOSIS — M793 Panniculitis, unspecified: Secondary | ICD-10-CM

## 2021-06-25 MED ORDER — SULFAMETHOXAZOLE-TRIMETHOPRIM 800-160 MG PO TABS: 1.0000 | ORAL_TABLET | Freq: Two times a day (BID) | ORAL | 0 refills | Status: DC

## 2021-06-25 NOTE — Telephone Encounter (Signed)
Patient noted some pain and redness at drain site and some firmness.  No fevers.  Bactrim Rx sent and patient will come to the office for exam in AM.

## 2021-06-26 ENCOUNTER — Other Ambulatory Visit: Payer: Self-pay

## 2021-06-26 ENCOUNTER — Ambulatory Visit (INDEPENDENT_AMBULATORY_CARE_PROVIDER_SITE_OTHER): Payer: Medicaid Other | Admitting: Physician Assistant

## 2021-06-26 DIAGNOSIS — Z9889 Other specified postprocedural states: Secondary | ICD-10-CM

## 2021-06-26 NOTE — Progress Notes (Signed)
Patient is a pleasant 41 year old female with PMH of panniculitis s/p panniculectomy with upper abdominal liposuction and plication performed 05/27/9756 by Dr. Claudia Desanctis who presents to clinic for postoperative follow-up.  She was previously seen here in clinic 06/21/2021 at which point her right-sided drain was removed due to low output.  Incision looked to be healing nicely, exam was reassuring.  However, she called the clinic over the weekend complaining of pain and redness at the drain insertion site and was prescribed a course of Bactrim.  She presents today for in person evaluation.  Patient tells me that yesterday when she called felt tender, painful, red, and swollen.  She did take a Bactrim this morning, as directed.  She does report that it has already improved considerably.  She denies any increased activity over the weekend that may have contributed to her symptoms.  She states that it was previously draining, but that is no longer the case.  Denies any fevers or ongoing pain/swelling.  Physical exam is entirely reassuring.  Her drain tube insertion sites appear dry, as does her panniculectomy incision.  No erythema.  Mild tenderness near her right-sided drain tube insertion site.  No obvious fluid collection noted.  There is mild firmness, may reflect small degree of fat necrosis versus residual swelling from her surgery.  No obvious subcutaneous fluid collections.  Patient reports improvement in her exam is not concerning for infection.  Do not feel as though she needs to continue with the Bactrim that was prescribed yesterday.  Recommending Vaseline on the incisions and drain insertion tube sites given that they are dry.  Gauze pads to collect drainage as needed.  Suspect that she is in continue to heal nicely.  She will return in a couple of weeks for her scheduled postoperative follow-up.  She knows to call the clinic should she develop any new or worsening symptoms.

## 2021-06-28 ENCOUNTER — Encounter: Payer: Medicaid Other | Admitting: Plastic Surgery

## 2021-06-28 ENCOUNTER — Encounter: Payer: Medicaid Other | Admitting: Physician Assistant

## 2021-07-04 ENCOUNTER — Encounter: Payer: Self-pay | Admitting: Plastic Surgery

## 2021-07-19 ENCOUNTER — Other Ambulatory Visit: Payer: Self-pay

## 2021-07-19 ENCOUNTER — Encounter: Payer: Self-pay | Admitting: Plastic Surgery

## 2021-07-19 ENCOUNTER — Ambulatory Visit (INDEPENDENT_AMBULATORY_CARE_PROVIDER_SITE_OTHER): Payer: Medicaid Other | Admitting: Plastic Surgery

## 2021-07-19 DIAGNOSIS — Z9889 Other specified postprocedural states: Secondary | ICD-10-CM

## 2021-07-19 NOTE — Progress Notes (Signed)
Patient presents in follow-up from infraumbilical panniculectomy combined with upper abdominal liposuction and plication.  She is overall very happy.  On exam she has a great contour anteriorly.  Her scar is healed nicely.  She has a nice looking umbilicus.  She does have some excess and dogears laterally on either side which probably could not have been helped given the excess that extends around the hips and onto the lower back.  This is the only aspect that either 1 of [place] feels could be improved.  I do think if she wants it would be reasonable to excise the dogears on either side which could be did not done in the office under local.  That procedure would last about 1 hour total time.  She is going to think about this and let us know.  Otherwise she can resume regular activities and does not need to continue to wear compressive garments unless she wants to.  All her questions were answered. ?

## 2021-07-25 ENCOUNTER — Telehealth: Payer: Medicaid Other | Admitting: Physician Assistant

## 2021-07-25 DIAGNOSIS — B3731 Acute candidiasis of vulva and vagina: Secondary | ICD-10-CM | POA: Diagnosis not present

## 2021-07-25 MED ORDER — FLUCONAZOLE 150 MG PO TABS: 150.0000 mg | ORAL_TABLET | Freq: Once | ORAL | 0 refills | Status: AC

## 2021-07-25 NOTE — Progress Notes (Signed)
I have spent 5 minutes in review of e-visit questionnaire, review and updating patient chart, medical decision making and response to patient.   Tomi Grandpre Cody Christmas Faraci, PA-C    

## 2021-07-25 NOTE — Progress Notes (Signed)

## 2021-08-09 ENCOUNTER — Telehealth: Payer: Self-pay | Admitting: *Deleted

## 2021-08-09 NOTE — Telephone Encounter (Signed)
Patient given all office information for scheduled appointment on 08/28/2021 at 9:30 with a 9:15 AM arrival time. ?

## 2021-08-28 ENCOUNTER — Other Ambulatory Visit (HOSPITAL_COMMUNITY)
Admission: RE | Admit: 2021-08-28 | Discharge: 2021-08-28 | Disposition: A | Discharge status: Home / Self Care | Payer: Medicaid Other | Source: Ambulatory Visit | Attending: Obstetrics & Gynecology | Admitting: Obstetrics & Gynecology

## 2021-08-28 ENCOUNTER — Ambulatory Visit: Payer: Medicaid Other | Admitting: Obstetrics & Gynecology

## 2021-08-28 ENCOUNTER — Encounter: Payer: Self-pay | Admitting: Obstetrics & Gynecology

## 2021-08-28 VITALS — BP 139/94 | HR 68 | Ht 65.0 in | Wt 230.0 lb

## 2021-08-28 DIAGNOSIS — Z01419 Encounter for gynecological examination (general) (routine) without abnormal findings: Secondary | ICD-10-CM | POA: Diagnosis not present

## 2021-08-28 DIAGNOSIS — Z113 Encounter for screening for infections with a predominantly sexual mode of transmission: Secondary | ICD-10-CM | POA: Insufficient documentation

## 2021-08-28 DIAGNOSIS — N898 Other specified noninflammatory disorders of vagina: Secondary | ICD-10-CM | POA: Diagnosis not present

## 2021-08-28 NOTE — Progress Notes (Signed)
Subjective:  ?  ? Zoe Soto is a 41 y.o. female here for a routine exam.  Current complaints: recurrent BV.  Has monthly menses that are normal flow (used to be heavy--had novasure that caused full thickness thermal injury and needed a small bowel resection ?  ?Gynecologic History ?Patient's last menstrual period was 08/12/2021. ?Contraception: tubal ligation ?Last Mammogram: 2020- normal ?Last Pap Smear:  2020- normal ?Last Colon Screening;  n/a ?Seat Belts:   Yes ?Sun Screen:   No ?Dental Check Up:  Yes ?Brush & Floss:  Yes  ? ?Obstetric History ?OB History  ?Gravida Para Term Preterm AB Living  ?'3       1 1  '$ ?SAB IAB Ectopic Multiple Live Births  ?1          ?  ?# Outcome Date GA Lbr Len/2nd Weight Sex Delivery Anes PTL Lv  ?3 Gravida           ?2 Gravida           ?1 SAB           ? ? ? ?The following portions of the patient's history were reviewed and updated as appropriate: allergies, current medications, past family history, past medical history, past social history, past surgical history, and problem list. ? ?Review of Systems ?Pertinent items noted in HPI and remainder of comprehensive ROS otherwise negative.  ?  ?Objective:  ? ?  ?Vitals:  ? 08/28/21 0927  ?BP: (!) 139/94  ?Pulse: 68  ?Weight: 230 lb (104.3 kg)  ?Height: '5\' 5"'$  (1.651 m)  ? ?Vitals:  WNL ?General appearance: alert, cooperative and no distress ? ?HEENT: Normocephalic, without obvious abnormality, atraumatic ?Eyes: negative ?Throat: lips, mucosa, and tongue normal; teeth and gums normal  ?Respiratory: Clear to auscultation bilaterally  ?CV: Regular rate and rhythm  ?Breasts:  Normal appearance, no masses or tenderness, no nipple retraction or dimpling  ?GI: Soft, non-tender; bowel sounds normal; no masses,  no organomegaly  ?GU: External Genitalia:  Tanner V, no lesion ?Urethra:  No prolapse   ?Vagina: Pink, normal rugae, no blood or discharge  ?Cervix: No CMT, no lesion  ?Uterus:  Normal size and contour, non tender  ?Adnexa: Normal, no  masses, non tender  ?Musculoskeletal: No edema, redness or tenderness in the calves or thighs  ?Skin: No lesions or rash  ?Lymphatic: Axillary adenopathy: none     ?Psychiatric: Normal mood and behavior  ? ? ?   ? ?Assessment:  ? ? Healthy female exam.  ?  ?Plan:  ?41 yo female for well woman exam and complaints of recurrent BV.  She has had a couple of e vists and paid out of pocket with Dr. Ronita Hipps for diagnosis.  Now that she is re-established with [place] I would like to explore if she has a resistant case or has recurrent infections. ?1-Aptima today; will treat infection and conduct TOC ?2-Pap smear with co testing ?3-Annual mammograms ?4-STD testing  ? ? ?

## 2021-08-28 NOTE — Progress Notes (Signed)
Last Mammogram: 2020- normal ?Last Pap Smear:  2020- normal ?Last Colon Screening;  n/a ?Seat Belts:   Yes ?Sun Screen:   No ?Dental Check Up:  Yes ?Brush & Floss:  Yes  ? ?Pt c/o recurrent BV ?

## 2021-08-29 LAB — HEPATITIS C ANTIBODY
Hepatitis C Ab: NONREACTIVE
SIGNAL TO CUT-OFF: 0.14 (ref <1.00)

## 2021-08-29 LAB — HIV ANTIBODY (ROUTINE TESTING W REFLEX): HIV 1&2 Ab, 4th Generation: NONREACTIVE

## 2021-08-29 LAB — CERVICOVAGINAL ANCILLARY ONLY
Bacterial Vaginitis (gardnerella): NEGATIVE
Candida Glabrata: NEGATIVE
Candida Vaginitis: NEGATIVE
Chlamydia: NEGATIVE
Comment: NEGATIVE
Comment: NEGATIVE
Comment: NEGATIVE
Comment: NEGATIVE
Comment: NEGATIVE
Comment: NORMAL
Neisseria Gonorrhea: NEGATIVE
Trichomonas: NEGATIVE

## 2021-08-29 LAB — HEPATITIS B SURFACE ANTIGEN: Hepatitis B Surface Ag: NONREACTIVE

## 2021-08-29 LAB — RPR: RPR Ser Ql: NONREACTIVE

## 2021-08-31 LAB — CYTOLOGY - PAP
Comment: NEGATIVE
Diagnosis: NEGATIVE
High risk HPV: NEGATIVE

## 2021-09-12 ENCOUNTER — Other Ambulatory Visit: Payer: Self-pay

## 2021-09-12 ENCOUNTER — Telehealth (INDEPENDENT_AMBULATORY_CARE_PROVIDER_SITE_OTHER): Payer: Medicaid Other | Admitting: Registered Nurse

## 2021-09-12 ENCOUNTER — Telehealth: Payer: Self-pay

## 2021-09-12 ENCOUNTER — Encounter: Payer: Self-pay | Admitting: Registered Nurse

## 2021-09-12 DIAGNOSIS — F410 Panic disorder [episodic paroxysmal anxiety] without agoraphobia: Secondary | ICD-10-CM | POA: Diagnosis not present

## 2021-09-12 DIAGNOSIS — F4321 Adjustment disorder with depressed mood: Secondary | ICD-10-CM | POA: Diagnosis not present

## 2021-09-12 MED ORDER — FLUOXETINE HCL 20 MG PO CAPS: 20.0000 mg | ORAL_CAPSULE | Freq: Every day | ORAL | 1 refills | Status: DC

## 2021-09-12 MED ORDER — CLONAZEPAM 0.5 MG PO TBDP: 0.5000 mg | ORAL_TABLET | Freq: Three times a day (TID) | ORAL | 0 refills | Status: DC | PRN

## 2021-09-12 NOTE — Telephone Encounter (Signed)
PA submitted via CoverMyMeds.

## 2021-09-12 NOTE — Telephone Encounter (Signed)
Patient called in stating that insurance is having issues with approving a refill on the clonazePAM (KLONOPIN) 0.5 MG disintegrating tablet. Pharmacy advised patient to contact us.  ?

## 2021-09-12 NOTE — Progress Notes (Signed)
Telemedicine Encounter- SOAP NOTE Established Patient  This video encounter was conducted with the patient's (or proxy's) verbal consent via audio telecommunications: yes/no: Yes Patient was instructed to have this encounter in a suitably private space; and to only have persons present to whom they give permission to participate. In addition, patient identity was confirmed by use of name plus two identifiers (DOB and address).  I discussed the limitations, risks, security and privacy concerns of performing an evaluation and management service by telephone and the availability of in person appointments. I also discussed with the patient that there may be a patient responsible charge related to this service. The patient expressed understanding and agreed to proceed.  I spent a total of 23 minutes talking with the patient or their proxy.  Patient at home Provider in office  Participants: Kathrin Ruddy, NP and Ernest Haber  Chief Complaint  Patient presents with   Anxiety    Patient has been following up on anxiety and it is getting worse    Subjective   Zoe Soto is a 41 y.o. established patient. Video visit today for anxiety.  HPI She lost her husband last year. Having a tougher time with grief.  Has been having a tougher time with a change in therapist  Her anxiety has been a lot higher She has further anxiety as she tries to support her daughter while she feels worse.  No hi/si. She did have a firearm in the house but has removed this from the home.  She had been on ativan and zoloft in the past. But notes that she felt like a robot on the zoloft, does not want to feel this way.   Patient Active Problem List   Diagnosis Date Noted   Environmental and seasonal allergies 07/20/2020   Eustachian tube dysfunction 07/20/2020   Asthma    Heart palpitations 05/13/2019   Paroxysmal SVT (supraventricular tachycardia) ([place]) 05/13/2019   PAC (premature atrial contraction)  05/13/2019   PVC (premature ventricular contraction) 05/13/2019   H/O laparoscopy 06/27/2018   Anemia 06/04/2018   Herpes simplex virus type 2 (HSV-2) infection affecting pregnancy in third trimester 02/21/2018   Intramural leiomyoma of uterus 05/26/2013   Irregular menstruation 08/04/2010    Past Medical History:  Diagnosis Date   Allergy    Seasonal   Asthma    Bee sting allergy    GERD (gastroesophageal reflux disease)    Herpes simplex virus (HSV) infection    History of chickenpox    Pneumonia 08/2017   RESOLVED    Current Outpatient Medications  Medication Sig Dispense Refill   albuterol (VENTOLIN HFA) 108 (90 Base) MCG/ACT inhaler Inhale 2 puffs into the lungs every 6 (six) hours as needed for wheezing or shortness of breath. 8 g 0   budesonide (PULMICORT FLEXHALER) 180 MCG/ACT inhaler Inhale 2 puffs into the lungs daily as needed. 1 each 12   ciclopirox (LOPROX) 0.77 % cream Apply topically 2 (two) times daily. 15 g 0   clonazePAM (KLONOPIN) 0.5 MG disintegrating tablet Take 1-2 tablets (0.5-1 mg total) by mouth 3 (three) times daily as needed (anxiety). 60 tablet 0   EPINEPHrine 0.3 mg/0.3 mL IJ SOAJ injection Inject 0.3 mg into the muscle as needed for anaphylaxis. 1 each 1   FLOVENT HFA 110 MCG/ACT inhaler Inhale 2 puffs into the lungs daily as needed. 1 each 12   FLUoxetine (PROZAC) 20 MG capsule Take 1 capsule (20 mg total) by mouth daily. 90 capsule 1  FLUTICASONE PROPIONATE, NASAL, NA Place 1-2 sprays into the nose as needed (for allergies).     LORazepam (ATIVAN) 0.5 MG tablet Take 1 tablet (0.5 mg total) by mouth 2 (two) times daily as needed for anxiety, sedation or sleep (motion sickness). 8 tablet 0   No current facility-administered medications for this visit.    Allergies  Allergen Reactions   Wasp Venom Anaphylaxis and Hives   Naproxen Other (See Comments)    Hematuria when taken in large doses   Beeswax    Other     Ants   Tape Rash    ADHESIVE  AND PAPER TAPE REMOVES SKIN    Social History   Socioeconomic History   Marital status: Widowed    Spouse name: Not on file   Number of children: 2   Years of education: 40   Highest education level: Not on file  Occupational History   Occupation: Administration  Tobacco Use   Smoking status: Former   Smokeless tobacco: Never  Scientific laboratory technician Use: Never used  Substance and Sexual Activity   Alcohol use: Yes    Alcohol/week: 4.0 standard drinks    Types: 4 Glasses of wine per week    Comment: 4 glasses of wine per week    Drug use: No   Sexual activity: Not Currently  Other Topics Concern   Not on file  Social History Narrative   Not on file   Social Determinants of Health   Financial Resource Strain: Not on file  Food Insecurity: Not on file  Transportation Needs: Not on file  Physical Activity: Not on file  Stress: Not on file  Social Connections: Not on file  Intimate Partner Violence: Not on file    ROS  Objective   Vitals as reported by the patient: There were no vitals filed for this visit.  Zoe Soto was seen today for anxiety.  Diagnoses and all orders for this visit:  Complicated grief -     FLUoxetine (PROZAC) 20 MG capsule; Take 1 capsule (20 mg total) by mouth daily.  Severe anxiety with panic -     clonazePAM (KLONOPIN) 0.5 MG disintegrating tablet; Take 1-2 tablets (0.5-1 mg total) by mouth 3 (three) times daily as needed (anxiety).   PLAN Start fluoxetine '20mg'$  po qd Start klonopin 0.'5mg'$  sublingual 1-2 tabs up to tid prn for severe anxiety Discussed support with counseling and coping mechanisms Advised on more acute resources available Patient encouraged to call clinic with any questions, comments, or concerns.    I discussed the assessment and treatment plan with the patient. The patient was provided an opportunity to ask questions and all were answered. The patient agreed with the plan and demonstrated an understanding of the  instructions.   The patient was advised to call back or seek an in-person evaluation if the symptoms worsen or if the condition fails to improve as anticipated.  I provided 23 minutes of face-to-face time during this encounter.  Maximiano Coss, NP

## 2021-09-12 NOTE — Patient Instructions (Signed)
° ° ° °  If you have lab work done today you will be contacted with your lab results within the next 2 weeks.  If you have not heard from us then please contact us. The fastest way to get your results is to register for My Chart. ° ° °IF you received an x-ray today, you will receive an invoice from [facility]. Please contact [facility] at [phone] with questions or concerns regarding your invoice.  ° °IF you received labwork today, you will receive an invoice from [facility]. Please contact [facility] at [phone] with questions or concerns regarding your invoice.  ° °Our billing staff will not be able to assist you with questions regarding bills from these companies. ° °You will be contacted with the lab results as soon as they are available. The fastest way to get your results is to activate your My Chart account. Instructions are located on the last page of this paperwork. If you have not heard from us regarding the results in 2 weeks, please contact this office. °  ° ° ° °

## 2021-10-11 ENCOUNTER — Encounter: Payer: Medicaid Other | Admitting: Registered Nurse

## 2021-10-17 ENCOUNTER — Other Ambulatory Visit: Payer: Self-pay | Admitting: Registered Nurse

## 2021-10-17 DIAGNOSIS — F410 Panic disorder [episodic paroxysmal anxiety] without agoraphobia: Secondary | ICD-10-CM

## 2021-10-17 DIAGNOSIS — F4381 Prolonged grief disorder: Secondary | ICD-10-CM | POA: Diagnosis not present

## 2021-10-18 NOTE — Telephone Encounter (Signed)
Patient is requesting a refill of the following medications: Requested Prescriptions   Pending Prescriptions Disp Refills   clonazePAM (KLONOPIN) 0.5 MG disintegrating tablet [Pharmacy Med Name: CLONAZEPAM 0.5 MG ODT] 60 tablet 0    Sig: Take 1-2 tablets (0.5-1 mg total) by mouth 3 (three) times daily as needed (anxiety).    Date of patient request: 10/18/21 Last office visit: 04/05/22 Date of last refill: 09/12/21 Last refill amount: 60

## 2021-10-25 DIAGNOSIS — F4381 Prolonged grief disorder: Secondary | ICD-10-CM | POA: Diagnosis not present

## 2021-11-03 DIAGNOSIS — F4381 Prolonged grief disorder: Secondary | ICD-10-CM | POA: Diagnosis not present

## 2021-11-13 ENCOUNTER — Telehealth: Payer: Medicaid Other | Admitting: Physician Assistant

## 2021-11-13 DIAGNOSIS — N760 Acute vaginitis: Secondary | ICD-10-CM

## 2021-11-13 DIAGNOSIS — B9689 Other specified bacterial agents as the cause of diseases classified elsewhere: Secondary | ICD-10-CM | POA: Diagnosis not present

## 2021-11-13 DIAGNOSIS — F4381 Prolonged grief disorder: Secondary | ICD-10-CM | POA: Diagnosis not present

## 2021-11-13 MED ORDER — FLUCONAZOLE 150 MG PO TABS: 150.0000 mg | ORAL_TABLET | Freq: Once | ORAL | 0 refills | Status: AC

## 2021-11-13 MED ORDER — METRONIDAZOLE 500 MG PO TABS: 500.0000 mg | ORAL_TABLET | Freq: Two times a day (BID) | ORAL | 0 refills | Status: AC

## 2021-11-13 NOTE — Progress Notes (Signed)

## 2021-11-13 NOTE — Addendum Note (Signed)
Addended by: Mar Daring on: 11/13/2021 01:21 PM   Modules accepted: Orders

## 2021-11-21 ENCOUNTER — Encounter: Payer: Self-pay | Admitting: Registered Nurse

## 2021-11-21 NOTE — Telephone Encounter (Signed)
Is there anyway you can call this patient to reschedule

## 2021-11-22 DIAGNOSIS — F4381 Prolonged grief disorder: Secondary | ICD-10-CM | POA: Diagnosis not present

## 2021-12-03 ENCOUNTER — Encounter (HOSPITAL_BASED_OUTPATIENT_CLINIC_OR_DEPARTMENT_OTHER): Payer: Self-pay

## 2021-12-03 ENCOUNTER — Emergency Department (HOSPITAL_BASED_OUTPATIENT_CLINIC_OR_DEPARTMENT_OTHER)
Admission: EM | Admit: 2021-12-03 | Discharge: 2021-12-04 | Disposition: A | Discharge status: Home / Self Care | Payer: Medicaid Other | Attending: Emergency Medicine | Admitting: Emergency Medicine

## 2021-12-03 DIAGNOSIS — Z87892 Personal history of anaphylaxis: Secondary | ICD-10-CM

## 2021-12-03 DIAGNOSIS — R9431 Abnormal electrocardiogram [ECG] [EKG]: Secondary | ICD-10-CM | POA: Diagnosis not present

## 2021-12-03 DIAGNOSIS — T7840XA Allergy, unspecified, initial encounter: Secondary | ICD-10-CM

## 2021-12-03 DIAGNOSIS — L500 Allergic urticaria: Secondary | ICD-10-CM | POA: Insufficient documentation

## 2021-12-03 DIAGNOSIS — R21 Rash and other nonspecific skin eruption: Secondary | ICD-10-CM | POA: Diagnosis present

## 2021-12-03 MED ORDER — DIPHENHYDRAMINE HCL 50 MG/ML IJ SOLN
25.0000 mg | Freq: Once | INTRAMUSCULAR | Status: AC
  Administered 2021-12-03: 25 mg via INTRAVENOUS
  Filled 2021-12-03: qty 1

## 2021-12-03 MED ORDER — FAMOTIDINE IN NACL 20-0.9 MG/50ML-% IV SOLN
20.0000 mg | Freq: Once | INTRAVENOUS | Status: AC
  Administered 2021-12-03: 20 mg via INTRAVENOUS
  Filled 2021-12-03: qty 50

## 2021-12-03 MED ORDER — LORAZEPAM 2 MG/ML IJ SOLN
1.0000 mg | Freq: Once | INTRAMUSCULAR | Status: AC
  Administered 2021-12-03: 1 mg via INTRAVENOUS
  Filled 2021-12-03: qty 1

## 2021-12-03 MED ORDER — DEXAMETHASONE SODIUM PHOSPHATE 10 MG/ML IJ SOLN
10.0000 mg | Freq: Once | INTRAMUSCULAR | Status: AC
  Administered 2021-12-03: 10 mg via INTRAVENOUS
  Filled 2021-12-03: qty 1

## 2021-12-03 NOTE — ED Provider Notes (Signed)
[facility] DE[facility] Provider Note   CSN: [id number] Arrival date & time: 12/03/21  2202     History  Chief Complaint  Patient presents with   Allergic Reaction    Zoe Soto is a 41 y.o. female.   Allergic Reaction   Patient presents to the ED for evaluation of an acute allergic reaction.  Patient has a history of allergy to bees and aunts.  Patient states this evening she went outside and about 20 minutes prior to arrival she was stung by something.  It was inside of her shirt.  Patient started to feel itchy all over.  She is not having any difficulty breathing.  She is very uncomfortable from the diffuse rash and itching.  Patient did use her EpiPen when she arrived here.  Home Medications Prior to Admission medications   Medication Sig Start Date End Date Taking? Authorizing Provider  albuterol (VENTOLIN HFA) 108 (90 Base) MCG/ACT inhaler Inhale 2 puffs into the lungs every 6 (six) hours as needed for wheezing or shortness of breath. 04/05/21   Maximiano Coss, NP  budesonide (PULMICORT FLEXHALER) 180 MCG/ACT inhaler Inhale 2 puffs into the lungs daily as needed. 04/05/21   Maximiano Coss, NP  ciclopirox (LOPROX) 0.77 % cream Apply topically 2 (two) times daily. 04/05/21   Maximiano Coss, NP  clonazePAM (KLONOPIN) 0.5 MG disintegrating tablet TAKE 1-2 TABLETS (0.5-1 MG TOTAL) BY MOUTH 3 (THREE) TIMES DAILY AS NEEDED (ANXIETY). 10/18/21   Midge Minium, MD  EPINEPHrine 0.3 mg/0.3 mL IJ SOAJ injection Inject 0.3 mg into the muscle as needed for anaphylaxis. 04/05/21   Maximiano Coss, NP  FLOVENT HFA 110 MCG/ACT inhaler Inhale 2 puffs into the lungs daily as needed. 04/05/21   Maximiano Coss, NP  FLUoxetine (PROZAC) 20 MG capsule Take 1 capsule (20 mg total) by mouth daily. 09/12/21   Maximiano Coss, NP  FLUTICASONE PROPIONATE, NASAL, NA Place 1-2 sprays into the nose as needed (for allergies).    [provider]  LORazepam (ATIVAN) 0.5 MG  tablet Take 1 tablet (0.5 mg total) by mouth 2 (two) times daily as needed for anxiety, sedation or sleep (motion sickness). 04/05/21   Maximiano Coss, NP      Allergies    Wasp venom, Naproxen, Beeswax, Other, and Tape    Review of Systems   Review of Systems  Physical Exam Updated Vital Signs BP (!) 137/97   Pulse 89   Temp 98.3 F (36.8 C) (Oral)   Resp (!) 24   Ht 1.651 m ('5\' 5"'$ )   Wt 97.1 kg   SpO2 100%   BMI 35.61 kg/m  Physical Exam Vitals and nursing note reviewed.  Constitutional:      Appearance: She is well-developed.     Comments: Patient uncomfortable having to stand up move around scratch herself  HENT:     Head: Normocephalic and atraumatic.     Right Ear: External ear normal.     Left Ear: External ear normal.     Mouth/Throat:     Comments: No oropharyngeal swelling Eyes:     General: No scleral icterus.       Right eye: No discharge.        Left eye: No discharge.     Conjunctiva/sclera: Conjunctivae normal.  Neck:     Trachea: No tracheal deviation.  Cardiovascular:     Rate and Rhythm: Normal rate.  Pulmonary:     Effort: Pulmonary effort is normal. No respiratory distress.  Breath sounds: No stridor. No wheezing.  Abdominal:     General: There is no distension.  Musculoskeletal:        General: No swelling or deformity.     Cervical back: Neck supple.  Skin:    General: Skin is warm and dry.     Findings: Rash present.     Comments: Diffuse urticaria  Neurological:     Mental Status: She is alert.     Cranial Nerves: Cranial nerve deficit: no gross deficits.     ED Results / Procedures / Treatments   Labs (all labs ordered are listed, but only abnormal results are displayed) Labs Reviewed - No data to display  EKG EKG Interpretation  Date/Time:  Sunday December 03 2021 22:22:45 EDT Ventricular Rate:  90 PR Interval:    QRS Duration: 106 QT Interval:  396 QTC Calculation: 485 R Axis:   50 Text Interpretation: Poor data  quality Normal sinus rhythm Confirmed by Dorie Rank [phone]) on 12/03/2021 10:24:21 PM  Radiology No results found.  Procedures .Critical Care  Performed by: Dorie Rank, MD Authorized by: Dorie Rank, MD   Critical care provider statement:    Critical care time (minutes):  30   Critical care was time spent personally by me on the following activities:  Development of treatment plan with patient or surrogate, discussions with consultants, evaluation of patient's response to treatment, examination of patient, ordering and review of laboratory studies, ordering and review of radiographic studies, ordering and performing treatments and interventions, pulse oximetry, re-evaluation of patient's condition and review of old charts     Medications Ordered in ED Medications  diphenhydrAMINE (BENADRYL) injection 25 mg (has no administration in time range)  diphenhydrAMINE (BENADRYL) injection 25 mg (25 mg Intravenous Given 12/03/21 2228)  dexamethasone (DECADRON) injection 10 mg (10 mg Intravenous Given 12/03/21 2228)  famotidine (PEPCID) IVPB 20 mg premix (20 mg Intravenous New Bag/Given 12/03/21 2232)  LORazepam (ATIVAN) injection 1 mg (1 mg Intravenous Given 12/03/21 2243)    ED Course/ Medical Decision Making/ A&P                           Medical Decision Making Problems Addressed: Allergic reaction, initial encounter: acute illness or injury that poses a threat to life or bodily functions  Risk Prescription drug management.   Patient presents ED for evaluation of an acute allergic reaction.  Patient does have known allergy to bee stings and ants.  Patient had significant hives on arrival.  No evidence of any respiratory compromise.  Patient gave herself an EpiPen.  Patient had persistent itching despite her initial dose of medications.  Additional Benadryl was given.  Patient was also given Ativan for her discomfort increased itching.  She has received antihistamines and steroids.  We will  plan on monitoring in the ed to make sure she doesn't have recurrent or worsening symptoms        Final Clinical Impression(s) / ED Diagnoses Final diagnoses:  Allergic reaction, initial encounter    Rx / DC Orders ED Discharge Orders     None         Dorie Rank, MD 12/03/21 2324

## 2021-12-03 NOTE — ED Triage Notes (Signed)
Pt presents to the ED having an allergic reaction to an insect bite. States that the bite happened about 20 minutes prior. States that her body is itching and hurting. No swelling noted in throat. Hives noted. Pt used her epi pen in the waiting room

## 2021-12-03 NOTE — ED Notes (Signed)
RT called to triage for allergic reaction w/home epi used. Pt airway patency assessed at this time. Pt respiratory status stable w/no distress noted at this time. Airway patent w/no stridor present, (uvula, tongue and throat normal). RT will continue to monitor.

## 2021-12-04 MED ORDER — PREDNISONE 10 MG (21) PO TBPK: ORAL_TABLET | ORAL | 0 refills | Status: DC

## 2021-12-04 MED ORDER — EPINEPHRINE 0.3 MG/0.3ML IJ SOAJ: 0.3000 mg | INTRAMUSCULAR | 1 refills | Status: DC | PRN

## 2021-12-04 NOTE — ED Notes (Signed)
Wasted '1mg'$  ativan into sharps container in med room, witnessed by RN Laurice Record 12/04/2021 at 1522pm.

## 2021-12-04 NOTE — ED Notes (Signed)
Pt verbalizes understanding of discharge instructions. Opportunity for questioning and answers were provided. Pt discharged from ED to home with friend.    

## 2021-12-04 NOTE — ED Provider Notes (Signed)
Care of the patient assumed at the change of shift. Observation for post-epi pen allergic reaction Physical Exam  BP 140/88 (BP Location: Left Arm)   Pulse (!) 50   Temp 98.5 F (36.9 C) (Oral)   Resp 16   Ht '5\' 5"'$  (1.651 m)   Wt 97.1 kg   SpO2 100%   BMI 35.61 kg/m   Physical Exam  Procedures  Procedures  ED Course / MDM   Clinical Course as of 12/04/21 0206  Mon Dec 04, 2021  0204 Patient resting comfortably. No signs of airway involvement. Ready to go home. Will give Rx for Prednisone, refill of epipen. PCP follow up. RTED for any other concerns.  [CS]    Clinical Course User Index [CS] Truddie Hidden, MD   Medical Decision Making Problems Addressed: Allergic reaction, initial encounter: acute illness or injury  Risk Prescription drug management.          Truddie Hidden, MD 12/04/21 [phone]

## 2022-01-13 IMAGING — DX DG CHEST 2V
2 series · 2 of 2 positions shown · non-contrast
Comparison: None.

CLINICAL DATA: Chest pain for the past month.

EXAM:
CHEST - 2 VIEW

[chest pa]
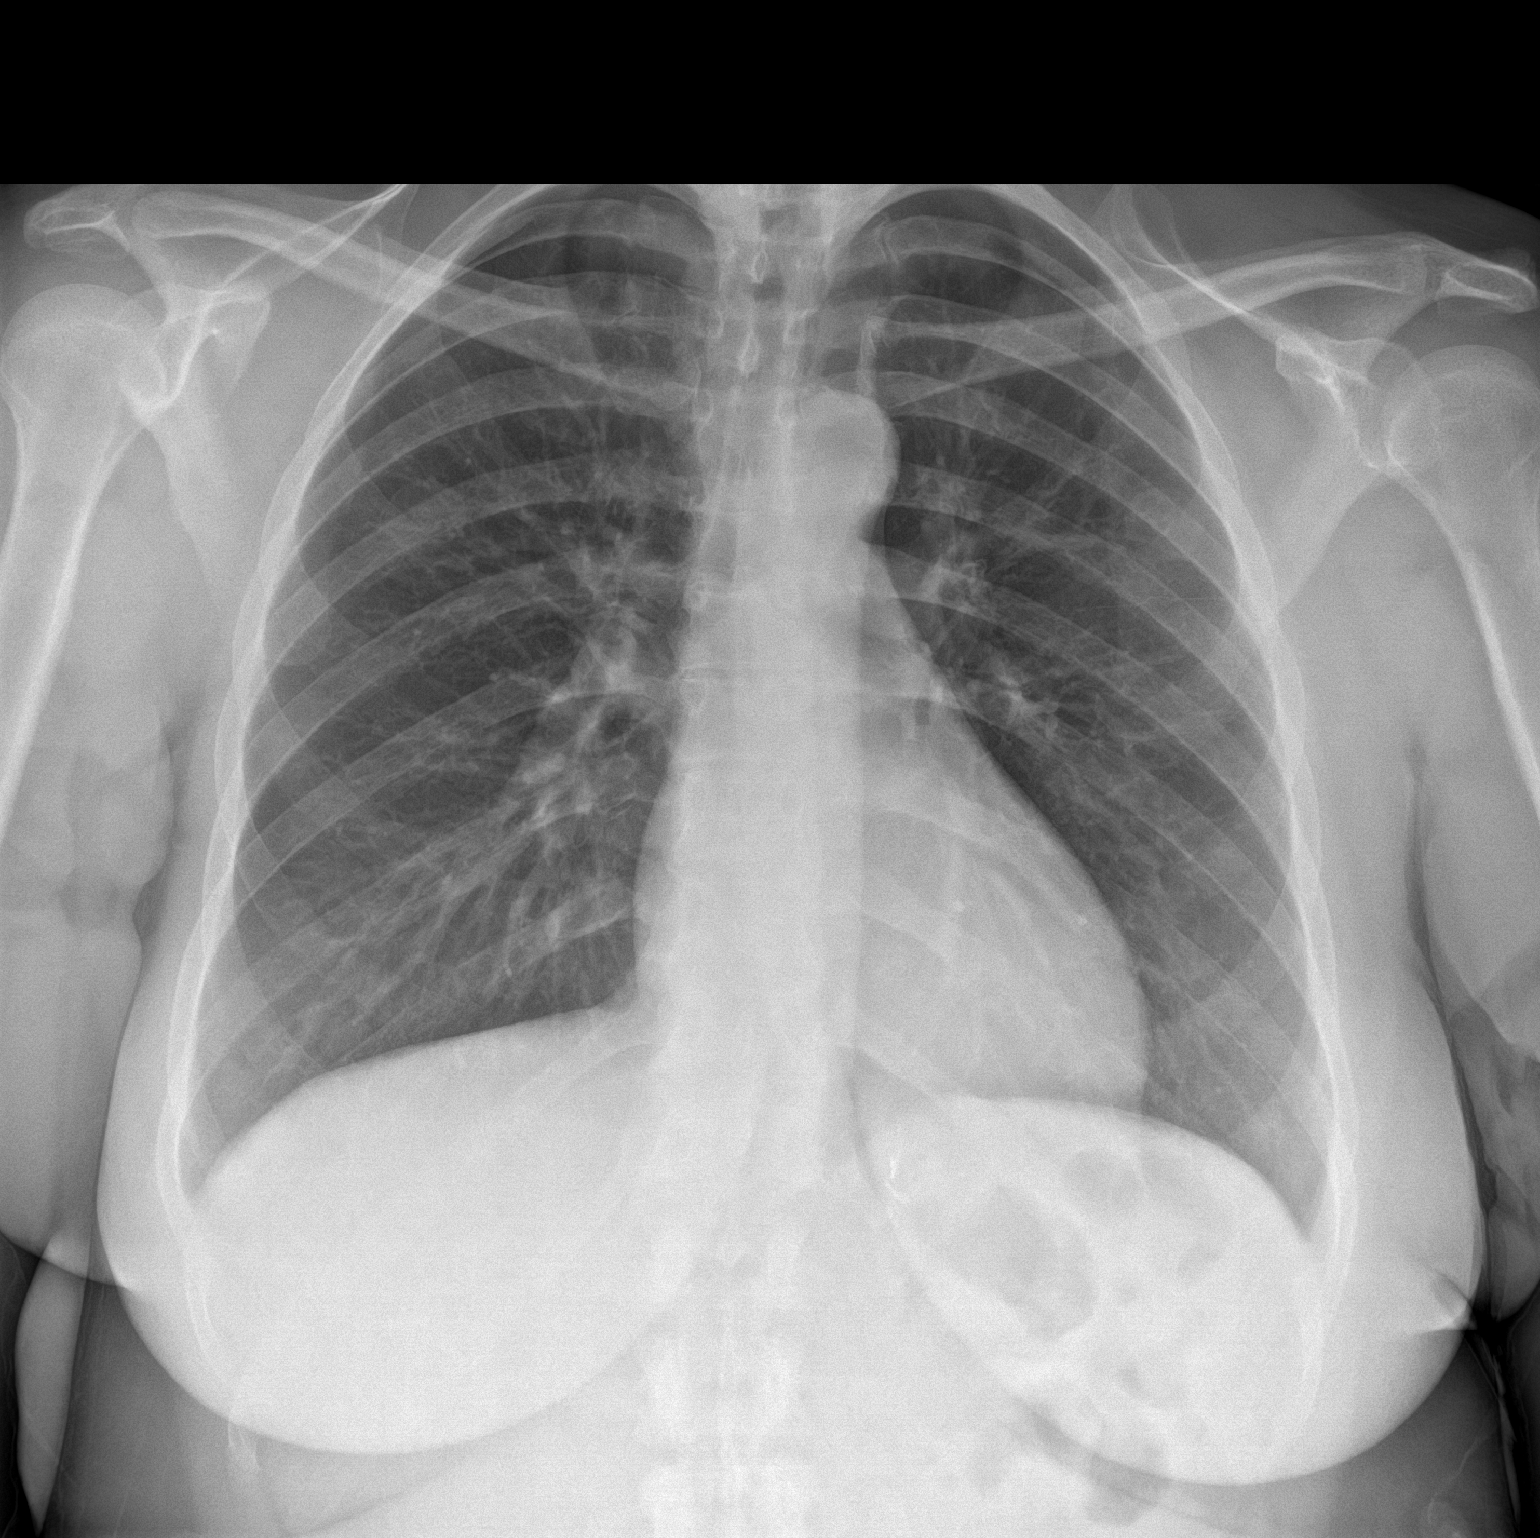

[chest lat]
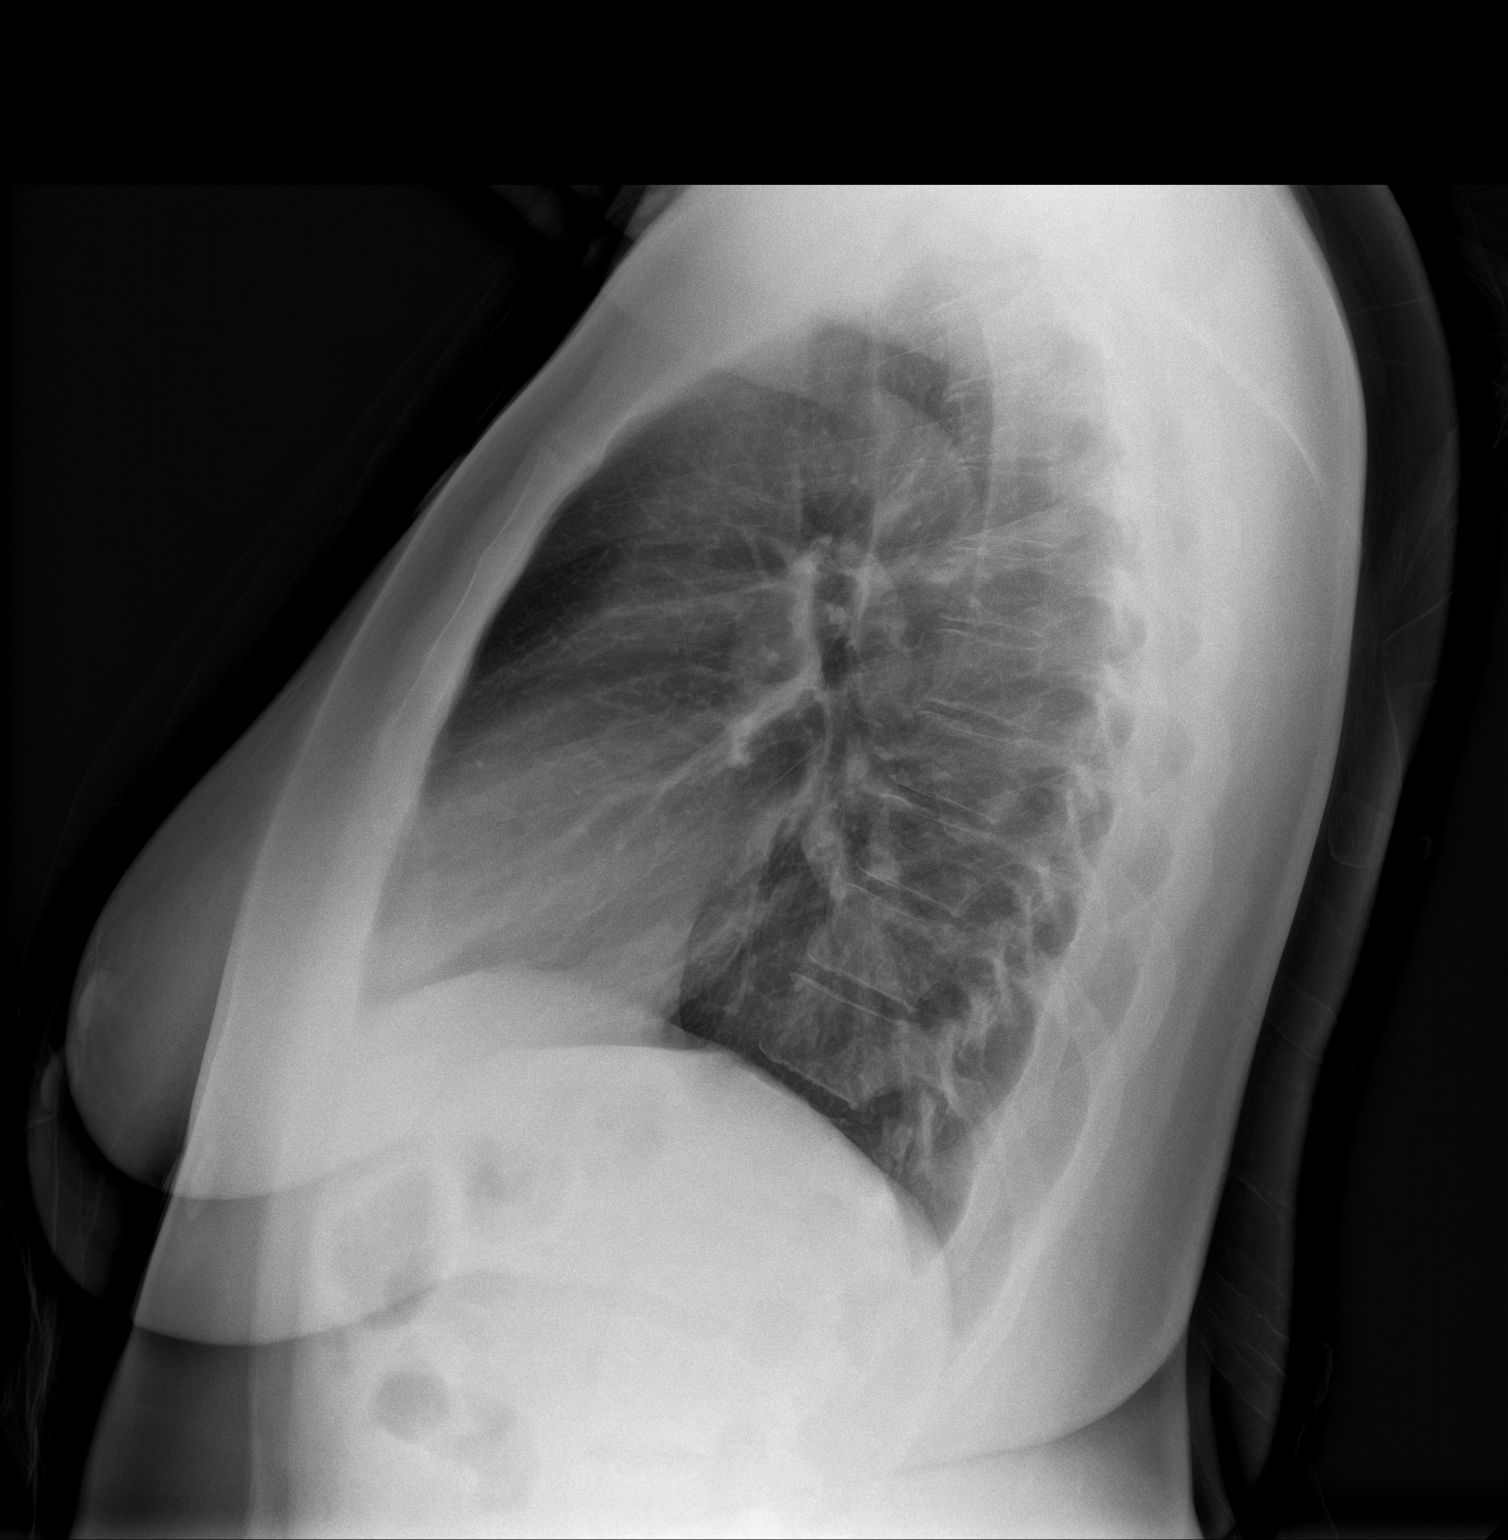

[2 of 2 positions shown; findings below may reference images not displayed]

FINDINGS: The heart size and mediastinal contours are within normal limits.
Both lungs are clear. The visualized skeletal structures are
unremarkable.
IMPRESSION: No active cardiopulmonary disease.

## 2022-01-17 DIAGNOSIS — F4381 Prolonged grief disorder: Secondary | ICD-10-CM | POA: Diagnosis not present

## 2022-02-16 ENCOUNTER — Other Ambulatory Visit: Payer: Self-pay

## 2022-02-16 ENCOUNTER — Telehealth: Payer: Self-pay | Admitting: Family Medicine

## 2022-02-16 DIAGNOSIS — F4321 Adjustment disorder with depressed mood: Secondary | ICD-10-CM

## 2022-02-16 MED ORDER — FLUOXETINE HCL 20 MG PO CAPS: 20.0000 mg | ORAL_CAPSULE | Freq: Every day | ORAL | 1 refills | Status: DC

## 2022-02-16 NOTE — Telephone Encounter (Signed)
Refill sent and pt notified. 

## 2022-02-16 NOTE — Telephone Encounter (Signed)
Encourage patient to contact the pharmacy for refills or they can request refills through [facility]  (Please schedule appointment if patient has not been seen in over a year)    WHAT PHARMACY WOULD THEY LIKE THIS SENT TO:  [facility] [address] [phone]  MEDICATION NAME & DOSE: fluoxetine 20 mg  NOTES/COMMENTS FROM PATIENT: Zoe Soto [phone]      [place] office please notify patient: It takes 48-72 hours to process rx refill requests Ask patient to call pharmacy to ensure rx is ready before heading there.

## 2022-02-21 DIAGNOSIS — F4381 Prolonged grief disorder: Secondary | ICD-10-CM | POA: Diagnosis not present

## 2022-02-28 ENCOUNTER — Ambulatory Visit: Payer: Medicaid Other | Admitting: Family Medicine

## 2022-02-28 ENCOUNTER — Encounter: Payer: Self-pay | Admitting: Family Medicine

## 2022-02-28 VITALS — BP 130/72 | HR 73 | Temp 98.0°F | Resp 16 | Ht 65.0 in | Wt 216.0 lb

## 2022-02-28 DIAGNOSIS — J0100 Acute maxillary sinusitis, unspecified: Secondary | ICD-10-CM | POA: Diagnosis not present

## 2022-02-28 MED ORDER — AMOXICILLIN 875 MG PO TABS: 875.0000 mg | ORAL_TABLET | Freq: Two times a day (BID) | ORAL | 0 refills | Status: AC

## 2022-02-28 NOTE — Progress Notes (Signed)
   Subjective:    Patient ID: Zoe Soto, female    DOB: December 26, 1980, 41 y.o.   MRN: [id number]  HPI Congestion- sxs started ~1 week ago w/ HA.  Then 2 days ago developed congestion.  Having pain and pressure behind eyes.  Bilateral ear fullness.  + fatigue.  Decreased appetite.  No known sick contacts.  Negative COVID x2.  No chest congestion or cough.   Review of Systems For ROS see HPI     Objective:   Physical Exam Vitals reviewed.  Constitutional:      General: She is not in acute distress.    Appearance: Normal appearance. She is well-developed. She is not ill-appearing.  HENT:     Head: Normocephalic and atraumatic.     Right Ear: Tympanic membrane and ear canal normal.     Left Ear: Tympanic membrane and ear canal normal.     Nose: Mucosal edema and congestion present. No rhinorrhea.     Right Sinus: Maxillary sinus tenderness present. No frontal sinus tenderness.     Left Sinus: Maxillary sinus tenderness present. No frontal sinus tenderness.     Mouth/Throat:     Pharynx: Uvula midline. Posterior oropharyngeal erythema (w/ copious PND) present. No oropharyngeal exudate.  Eyes:     Conjunctiva/sclera: Conjunctivae normal.     Pupils: Pupils are equal, round, and reactive to light.  Cardiovascular:     Rate and Rhythm: Normal rate and regular rhythm.     Heart sounds: Normal heart sounds.  Pulmonary:     Effort: Pulmonary effort is normal. No respiratory distress.     Breath sounds: Normal breath sounds. No wheezing.  Musculoskeletal:     Cervical back: Normal range of motion and neck supple.  Lymphadenopathy:     Cervical: No cervical adenopathy.  Skin:    General: Skin is warm and dry.  Neurological:     General: No focal deficit present.     Mental Status: She is alert and oriented to person, place, and time.     Cranial Nerves: No cranial nerve deficit.     Motor: No weakness.     Coordination: Coordination normal.  Psychiatric:        Mood and Affect:  Mood normal.        Behavior: Behavior normal.        Thought Content: Thought content normal.           Assessment & Plan:   Maxillary sinusitis- new.  Pt reports she has had similar sxs and infxns in the past and this is consistent.  She is on day 7 of sxs and worsening rather than improving.  Start Amoxicillin '875mg'$  BID x10 days.  Reviewed supportive care and red flags that should prompt return.  Note provided for school.  Pt expressed understanding and is in agreement w/ plan.

## 2022-02-28 NOTE — Patient Instructions (Signed)
Follow up as needed or as scheduled START the Amoxicillin twice daily- take w/ food ADD daily Claritin or Zyrtec (store brand generic works just was well) to help w/ congestion Drink LOTS of fluids Call with any questions or concerns Hang in there!!!

## 2022-03-01 ENCOUNTER — Ambulatory Visit: Payer: Medicaid Other | Admitting: Family Medicine

## 2022-04-21 ENCOUNTER — Telehealth: Payer: Medicaid Other | Admitting: Nurse Practitioner

## 2022-04-21 DIAGNOSIS — B3731 Acute candidiasis of vulva and vagina: Secondary | ICD-10-CM

## 2022-04-21 MED ORDER — FLUCONAZOLE 150 MG PO TABS: 150.0000 mg | ORAL_TABLET | Freq: Once | ORAL | 0 refills | Status: AC

## 2022-04-21 NOTE — Progress Notes (Signed)

## 2022-04-23 NOTE — Progress Notes (Signed)
Reponse to patient

## 2022-05-23 ENCOUNTER — Telehealth: Payer: Medicaid Other | Admitting: Physician Assistant

## 2022-05-23 DIAGNOSIS — B3731 Acute candidiasis of vulva and vagina: Secondary | ICD-10-CM | POA: Diagnosis not present

## 2022-05-23 MED ORDER — FLUCONAZOLE 150 MG PO TABS: 150.0000 mg | ORAL_TABLET | Freq: Once | ORAL | 0 refills | Status: AC

## 2022-05-23 NOTE — Progress Notes (Signed)
I have spent 5 minutes in review of e-visit questionnaire, review and updating patient chart, medical decision making and response to patient.   Joshu Furukawa Cody Kiaira Pointer, PA-C    

## 2022-05-23 NOTE — Progress Notes (Signed)

## 2022-06-19 ENCOUNTER — Ambulatory Visit: Payer: Medicaid Other | Admitting: Obstetrics and Gynecology

## 2022-06-19 ENCOUNTER — Encounter: Payer: Self-pay | Admitting: Obstetrics and Gynecology

## 2022-06-19 ENCOUNTER — Other Ambulatory Visit (HOSPITAL_COMMUNITY)
Admission: RE | Admit: 2022-06-19 | Discharge: 2022-06-19 | Disposition: A | Discharge status: Home / Self Care | Payer: Medicaid Other | Source: Ambulatory Visit | Attending: Obstetrics and Gynecology | Admitting: Obstetrics and Gynecology

## 2022-06-19 VITALS — BP 125/78 | HR 68 | Resp 16 | Ht 65.0 in | Wt 227.0 lb

## 2022-06-19 DIAGNOSIS — Z113 Encounter for screening for infections with a predominantly sexual mode of transmission: Secondary | ICD-10-CM | POA: Diagnosis not present

## 2022-06-19 DIAGNOSIS — N898 Other specified noninflammatory disorders of vagina: Secondary | ICD-10-CM

## 2022-06-19 NOTE — Progress Notes (Signed)
GYNECOLOGY ENCOUNTER NOTE  History:     Zoe Soto is a 42 y.o. [id number] female here for recurrent vaginal discharge with odor. Usually occurs following her menstrual cycle. Did have a new partner and would like to have STI testing. Was tested and treated for BV back in 08/2021.  Recently transferred to our office from [facility].  Obstetric History OB History  Gravida Para Term Preterm AB Living  4 2     1 1  $ SAB IAB Ectopic Multiple Live Births  1       1    # Outcome Date GA Lbr Len/2nd Weight Sex Delivery Anes PTL Lv  4 Para         DEC  3 Para           2 SAB           1 Gravida             Past Medical History:  Diagnosis Date   Allergy    Seasonal   Asthma    Bee sting allergy    GERD (gastroesophageal reflux disease)    Herpes simplex virus (HSV) infection    History of chickenpox    Pneumonia 08/2017   RESOLVED    Past Surgical History:  Procedure Laterality Date   ABDOMINOPLASTY     ANKLE SURGERY Right 2011   DILITATION & CURRETTAGE/HYSTROSCOPY WITH NOVASURE ABLATION N/A 06/27/2018   Procedure: DILATATION & CURETTAGE/HYSTEROSCOPY WITH NOVASURE ABLATION;  Surgeon: Tyson Dense, MD;  Location: [facility];  Service: Gynecology;  Laterality: N/A;   GASTRIC BYPASS  07/2015   GASTRIC BYPASS     KNEE ARTHROSCOPY Left 2010   LAPAROSCOPIC TUBAL LIGATION Bilateral 06/27/2018   Procedure: LAPAROSCOPIC TUBAL LIGATION;  Surgeon: Tyson Dense, MD;  Location: [facility];  Service: Gynecology;  Laterality: Bilateral;   SMALL BOWEL REPAIR N/A 06/27/2018   Procedure: SMALL BOWEL REPAIR;  Surgeon: Tyson Dense, MD;  Location: [facility];  Service: Gynecology;  Laterality: N/A;    Current Outpatient Medications on File Prior to Visit  Medication Sig Dispense Refill   albuterol (VENTOLIN HFA) 108 (90 Base) MCG/ACT inhaler Inhale 2 puffs into the lungs every 6 (six) hours as needed for wheezing or  shortness of breath. 8 g 0   budesonide (PULMICORT FLEXHALER) 180 MCG/ACT inhaler Inhale 2 puffs into the lungs daily as needed. 1 each 12   EPINEPHrine 0.3 mg/0.3 mL IJ SOAJ injection Inject 0.3 mg into the muscle as needed for anaphylaxis. 1 each 1   FLOVENT HFA 110 MCG/ACT inhaler Inhale 2 puffs into the lungs daily as needed. 1 each 12   FLUoxetine (PROZAC) 20 MG capsule Take 1 capsule (20 mg total) by mouth daily. 90 capsule 1   FLUTICASONE PROPIONATE, NASAL, NA Place 1-2 sprays into the nose as needed (for allergies).     No current facility-administered medications on file prior to visit.    Allergies  Allergen Reactions   Wasp Venom Anaphylaxis and Hives   Naproxen Other (See Comments)    Hematuria when taken in large doses   Beeswax    Other     Ants   Tape Rash    ADHESIVE AND PAPER TAPE REMOVES SKIN    Social History:  reports that she has quit smoking. She has never used smokeless tobacco. She reports current alcohol use of about 4.0 standard drinks of alcohol per week. She reports that she  does not use drugs.  Family History  Problem Relation Age of Onset   Alcohol abuse Mother    Heart attack Father 38   Heart disease Father    Asthma Brother    Diabetes Brother    Pancreatic cancer Maternal Grandmother    Diabetes Maternal Aunt    Congenital heart disease Son    Esophageal cancer Maternal Uncle    Esophageal cancer Maternal Uncle    Colon cancer Neg Hx    Stomach cancer Neg Hx     The following portions of the patient's history were reviewed and updated as appropriate: allergies, current medications, past family history, past medical history, past social history, past surgical history and problem list.  Review of Systems Pertinent items noted in HPI and remainder of comprehensive ROS otherwise negative.  Physical Exam:  BP 125/78   Pulse 68   Resp 16   Ht 5' 5"$  (1.651 m)   Wt 227 lb (103 kg)   LMP 06/02/2022   BMI 37.77 kg/m  CONSTITUTIONAL:  Well-developed, well-nourished female in no acute distress.  HENT:  Normocephalic NECK: Normal range of motion, supple, no masses.   PELVIC: Normal appearing external genitalia and urethral meatus, No abnormal vaginal discharge noted.     Assessment and Plan:    1. Vaginal discharge  - Cervicovaginal ancillary only( [place])  2. Screening examination for STD (sexually transmitted disease)  - Cervicovaginal ancillary only( [place]) - HIV antibody (with reflex) - RPR - Hepatitis C Antibody - Hepatitis B Surface AntiGEN   Shannah Conteh, Artist Pais, NP [place] for [employer], [place]

## 2022-06-20 LAB — CERVICOVAGINAL ANCILLARY ONLY
Bacterial Vaginitis (gardnerella): NEGATIVE
Candida Glabrata: NEGATIVE
Candida Vaginitis: NEGATIVE
Chlamydia: NEGATIVE
Comment: NEGATIVE
Comment: NEGATIVE
Comment: NEGATIVE
Comment: NEGATIVE
Comment: NEGATIVE
Comment: NORMAL
Neisseria Gonorrhea: NEGATIVE
Trichomonas: NEGATIVE

## 2022-06-20 LAB — HEPATITIS B SURFACE ANTIGEN: Hepatitis B Surface Ag: NEGATIVE

## 2022-06-20 LAB — RPR: RPR Ser Ql: NONREACTIVE

## 2022-06-20 LAB — HEPATITIS C ANTIBODY: Hep C Virus Ab: NONREACTIVE

## 2022-06-20 LAB — HIV ANTIBODY (ROUTINE TESTING W REFLEX): HIV Screen 4th Generation wRfx: NONREACTIVE

## 2022-07-18 ENCOUNTER — Other Ambulatory Visit: Payer: Self-pay

## 2022-07-18 DIAGNOSIS — J4520 Mild intermittent asthma, uncomplicated: Secondary | ICD-10-CM

## 2022-07-18 MED ORDER — ALBUTEROL SULFATE HFA 108 (90 BASE) MCG/ACT IN AERS: 2.0000 | INHALATION_SPRAY | Freq: Four times a day (QID) | RESPIRATORY_TRACT | 0 refills | Status: AC | PRN

## 2022-07-23 ENCOUNTER — Other Ambulatory Visit: Payer: Self-pay

## 2022-07-23 DIAGNOSIS — J4520 Mild intermittent asthma, uncomplicated: Secondary | ICD-10-CM

## 2022-07-23 MED ORDER — FLOVENT HFA 110 MCG/ACT IN AERO: 2.0000 | INHALATION_SPRAY | Freq: Every day | RESPIRATORY_TRACT | 12 refills | Status: AC | PRN

## 2022-08-03 ENCOUNTER — Other Ambulatory Visit: Payer: Self-pay | Admitting: Family Medicine

## 2022-08-03 DIAGNOSIS — F4321 Adjustment disorder with depressed mood: Secondary | ICD-10-CM

## 2022-08-22 ENCOUNTER — Telehealth: Payer: Medicaid Other | Admitting: Physician Assistant

## 2022-08-22 DIAGNOSIS — A6004 Herpesviral vulvovaginitis: Secondary | ICD-10-CM | POA: Diagnosis not present

## 2022-08-22 MED ORDER — VALACYCLOVIR HCL 500 MG PO TABS: 500.0000 mg | ORAL_TABLET | Freq: Two times a day (BID) | ORAL | 0 refills | Status: DC

## 2022-08-22 NOTE — Progress Notes (Signed)
E-Visit for Herpes Simplex  We are sorry that you are not feeling well.  Here is how we plan to help!  Based on what you have shared ith me, it looks like you may be having an outbreak/flare-up of genital herpes.    I have prescribed I have prescribed Valacyclovir 500 mg Take one by mouth twice a day for 3 days  Because of recent outbreaks that are uncommon for you, and a new sexual partner, I would highly recommend follow-up in person with your PCP for full testing.     If you have been prescribed long term medications to be taken on a regular basis, it is important to follow the recommendations and take them as ordered.    Outbreaks usually include blisters and open sores in the genital area. Outbreaks that happen after the first time are usually not as severe and do not last as long. Genital Herpes Simplex is a commonly sexually transmitted viral infection that is found worldwide. Most of these genital infections are caused by one or two herpes simplex viruses that is passed from person to person during vaginal, oral, or anal sex. Sometimes, people do not know they have herpes because they do not have any symptoms.  Please be aware that if you have genital herpes you can be contagious even when you are not having rash or flare-up and you may not have any symptoms, even when you are taking suppressive medicines.  Herpes cannot be cured. The disease usually causes most problems during the first few years. After that, the virus is still there, but it causes few to no symptoms. Even when the virus is active, people with herpes can take medicines to reduce and help prevent symptoms.  Herpes is an infection that can cause blisters and open sores on the genital area. Herpes is caused by a virus that is passed from person to person during vaginal, oral, or anal sex. Sometimes, people do not know they have herpes because they do not have any symptoms. Herpes cannot be cured. The disease usually causes most  problems during the first few years. After that, the virus is still there, but it causes few to no symptoms. Even when the virus is active, people with herpes can take medicines to reduce and help prevent symptoms.  If you have been prescribed medications to be taken on a regular basis, it is important to follow the recommendations and take them as ordered.  Some people with herpes never have any symptoms. But other people can develop symptoms within a few weeks of being infected with the herpes virus   Symptoms usually include blisters in the genital area. In women, this area includes the vagina, buttocks, anus, or thighs. In men, this area includes the penis, scrotum, anus, butt, or thighs. The blisters can become painful open sores, which then crust over as they heal. Sometimes, people can have other symptoms that include:  ?Blisters on the mouth or lips ?Fever, headache, or pain in the joints ?Trouble urinating  Outbreaks might occur every month or more often, or just once or twice a year. Sometimes, people can tell when an outbreak will occur, because they feel itching or pain beforehand. Sometimes they do not know that an outbreak is coming because they have no symptoms. Whatever your pattern is, keep in mind that herpes outbreaks usually become less frequent over time as you get older. Certain things, called "triggers," can make outbreaks more likely to occur. These include stress, sunlight,  menstrual periods,or getting sick.  Antiviral therapy can shorten the duration of symptoms and signs in primary infection, which, when untreated, can be associated with significant increase in the symptoms of the disease.  HOME CARE Use a portable bath (such as a "Sitz bath") where you can sit in warm water for about 20 minutes. Your bathtub could also work. Avoid bubble baths.  Keep the genital area clean and dry and avoid tight clothes.  Take over-the-counter pain medicine such as acetaminophen  (brand name: Tylenol) or ibuprofen sample brand names: Advil, Motrin). But avoid aspirin.  Only take medications as instructed by your medical team.  You are most likely to spread herpes to a sex partner when you have blisters and open sores on your body. But it's also possible to spread herpes to your partner when you do not have any symptoms. That is because herpes can be present on your body without causing any symptoms, like blisters or pain.  Telling your sex partner that you have herpes can be hard. But it can help protect them, since there are ways to lower the risk of spreading the infection.   Using a condom every time you have sex  Not having sex when you have symptoms  Not having oral sex if you have blisters or open sores (in the genital area or around your mouth)  MAKE SURE YOU   Understand these instructions. Do not have sex without using a condom until you have been seen by a doctor and as instructed by the provider If you are not better or improved within 7 days, you MUST have a follow up at your doctor or the health department for evaluation. There are other causes of rashes in the genital region.  Thank you for choosing an e-visit.  Your e-visit answers were reviewed by a board certified advanced clinical practitioner to complete your personal care plan. Depending upon the condition, your plan could have included both over the counter or prescription medications.  Please review your pharmacy choice. Make sure the pharmacy is open so you can pick up prescription now. If there is a problem, you may contact your provider through [employer] and have the prescription routed to another pharmacy.  Your safety is important to [place]. If you have drug allergies check your prescription carefully.   For the next 24 hours you can use MyChart to ask questions about today's visit, request a non-urgent call back, or ask for a work or school excuse. You will get an email in the next  two days asking about your experience. I hope that your e-visit has been valuable and will speed your recovery.

## 2022-08-22 NOTE — Progress Notes (Signed)
I have spent 5 minutes in review of e-visit questionnaire, review and updating patient chart, medical decision making and response to patient.   Ronelle Michie Cody Glendon Dunwoody, PA-C    

## 2022-08-24 ENCOUNTER — Encounter: Payer: Self-pay | Admitting: Obstetrics & Gynecology

## 2022-09-20 DIAGNOSIS — M79672 Pain in left foot: Secondary | ICD-10-CM | POA: Diagnosis not present

## 2022-10-01 ENCOUNTER — Emergency Department (HOSPITAL_BASED_OUTPATIENT_CLINIC_OR_DEPARTMENT_OTHER)
Admission: EM | Admit: 2022-10-01 | Discharge: 2022-10-01 | Disposition: A | Discharge status: Home / Self Care | Payer: Medicaid Other | Attending: Emergency Medicine | Admitting: Emergency Medicine

## 2022-10-01 ENCOUNTER — Emergency Department (HOSPITAL_BASED_OUTPATIENT_CLINIC_OR_DEPARTMENT_OTHER): Payer: Medicaid Other

## 2022-10-01 ENCOUNTER — Emergency Department (HOSPITAL_BASED_OUTPATIENT_CLINIC_OR_DEPARTMENT_OTHER): Payer: Medicaid Other | Admitting: Radiology

## 2022-10-01 ENCOUNTER — Other Ambulatory Visit: Payer: Self-pay

## 2022-10-01 DIAGNOSIS — M79621 Pain in right upper arm: Secondary | ICD-10-CM | POA: Diagnosis not present

## 2022-10-01 DIAGNOSIS — M25511 Pain in right shoulder: Secondary | ICD-10-CM | POA: Diagnosis not present

## 2022-10-01 DIAGNOSIS — M25521 Pain in right elbow: Secondary | ICD-10-CM | POA: Insufficient documentation

## 2022-10-01 DIAGNOSIS — M25531 Pain in right wrist: Secondary | ICD-10-CM | POA: Diagnosis not present

## 2022-10-01 DIAGNOSIS — S52501A Unspecified fracture of the lower end of right radius, initial encounter for closed fracture: Secondary | ICD-10-CM | POA: Insufficient documentation

## 2022-10-01 MED ORDER — HYDROCODONE-ACETAMINOPHEN 5-325 MG PO TABS
1.0000 | ORAL_TABLET | Freq: Once | ORAL | Status: AC
  Administered 2022-10-01: 1 via ORAL
  Filled 2022-10-01: qty 1

## 2022-10-01 MED ORDER — HYDROCODONE-ACETAMINOPHEN 5-325 MG PO TABS: 1.0000 | ORAL_TABLET | Freq: Four times a day (QID) | ORAL | 0 refills | Status: DC | PRN

## 2022-10-01 NOTE — Discharge Instructions (Addendum)
Please read and follow all provided instructions.  Your diagnoses today include:  1. Closed fracture of distal end of right radius, unspecified fracture morphology, initial encounter     Tests performed today include: An x-ray of the affected area -shows a broken distal radius, your humerus was fine Vital signs. See below for your results today.   Medications prescribed:  Vicodin (hydrocodone/acetaminophen) - narcotic pain medication  DO NOT drive or perform any activities that require you to be awake and alert because this medicine can make you drowsy. BE VERY CAREFUL not to take multiple medicines containing Tylenol (also called acetaminophen). Doing so can lead to an overdose which can damage your liver and cause liver failure and possibly death.  Take any prescribed medications only as directed.  Home care instructions:  Follow any educational materials contained in this packet Follow R.I.C.E. Protocol: R - rest your injury  I  - use ice on injury without applying directly to skin C - compress injury with bandage or splint E - elevate the injury as much as possible  Follow-up instructions: Please follow-up with your orthopedist or the provided orthopedic physician (bone specialist) in the upcoming week.   Return instructions:  Please return if your fingers are numb or tingling, appear gray or blue, or you have severe pain (also elevate the arm and loosen splint or wrap if you were given one) Please return to the Emergency Department if you experience worsening symptoms.  Please return if you have any other emergent concerns.  Additional Information:  Your vital signs today were: BP (!) 127/106   Pulse 75   Temp 98.4 F (36.9 C)   Resp (!) 21   SpO2 100%  If your blood pressure (BP) was elevated above 135/85 this visit, please have this repeated by your doctor within one month. --------------

## 2022-10-01 NOTE — ED Provider Notes (Signed)
[place] EMERGENCY DEPARTMENT AT [facility] Provider Note   CSN: [id number] Arrival date & time: 10/01/22  1623     History  Chief Complaint  Patient presents with   Wrist Pain    fall    Zoe Soto is a 42 y.o. female.  Patient seen by myself initially on arrival in triage.   Pt complains of right wrist and shoulder pain after a fall onto an outstretched right hand approximately 20 minutes prior to arrival.  Patient was on skates when she fell.  No head injuries.  No treatments prior to arrival.  Pain worse with motion.  No distal numbness or tingling.  She does complain of some elbow and shoulder pain as well.  She is an established patient with [facility].       Home Medications Prior to Admission medications   Medication Sig Start Date End Date Taking? Authorizing Provider  albuterol (VENTOLIN HFA) 108 (90 Base) MCG/ACT inhaler Inhale 2 puffs into the lungs every 6 (six) hours as needed for wheezing or shortness of breath. 07/18/22   Sheliah Hatch, MD  budesonide (PULMICORT FLEXHALER) 180 MCG/ACT inhaler Inhale 2 puffs into the lungs daily as needed. 04/05/21   Janeece Agee, NP  EPINEPHrine 0.3 mg/0.3 mL IJ SOAJ injection Inject 0.3 mg into the muscle as needed for anaphylaxis. 12/04/21   Pollyann Savoy, MD  FLOVENT HFA 110 MCG/ACT inhaler Inhale 2 puffs into the lungs daily as needed. 07/23/22   Sheliah Hatch, MD  FLUoxetine (PROZAC) 20 MG capsule TAKE 1 CAPSULE BY MOUTH EVERY DAY 08/06/22   Sheliah Hatch, MD  FLUTICASONE PROPIONATE, NASAL, NA Place 1-2 sprays into the nose as needed (for allergies).    [provider]  valACYclovir (VALTREX) 500 MG tablet Take 1 tablet (500 mg total) by mouth 2 (two) times daily. 08/22/22   Waldon Merl, PA-C      Allergies    Wasp venom, Naproxen, Beeswax, Other, and Tape    Review of Systems   Review of Systems  Physical Exam Updated Vital Signs BP (!) 127/106   Pulse 75   Temp  98.4 F (36.9 C)   Resp (!) 21   SpO2 100%   Physical Exam Vitals and nursing note reviewed.  Constitutional:      Appearance: She is well-developed.  HENT:     Head: Normocephalic and atraumatic.  Eyes:     Pupils: Pupils are equal, round, and reactive to light.  Cardiovascular:     Pulses: Normal pulses. No decreased pulses.  Musculoskeletal:        General: Tenderness present.     Right shoulder: Tenderness present. Normal range of motion.     Right elbow: Normal range of motion. Tenderness present.     Right wrist: Swelling, tenderness and bony tenderness present. No deformity, lacerations or snuff box tenderness. Decreased range of motion. Normal pulse.     Right hand: No tenderness or bony tenderness. Normal range of motion.     Cervical back: Normal range of motion and neck supple.     Comments: Patient with tenderness over the distal radius, no significant deformity palpated, no overlying laceration or skin disruption.  Skin:    General: Skin is warm and dry.  Neurological:     Mental Status: She is alert.     Sensory: No sensory deficit.     Comments: Motor, sensation, and vascular distal to the injury is fully intact.   Psychiatric:  Mood and Affect: Mood normal.     ED Results / Procedures / Treatments   Labs (all labs ordered are listed, but only abnormal results are displayed) Labs Reviewed - No data to display  EKG None  Radiology DG Wrist Complete Right  Result Date: 10/01/2022 CLINICAL DATA:  Fall on outstretched hand when skating. Right wrist pain. EXAM: RIGHT WRIST - COMPLETE 3+ VIEW COMPARISON:  None Available. FINDINGS: Neutral ulnar variance. There are multiple linear and curvilinear lucencies throughout the distal radial metadiaphysis indicating a comminuted, nondisplaced fracture. There is up to 2 mm cortical step-off at the dorsal aspect of the distal radial metaphysis on lateral view. No significant displacement. Joint spaces are preserved.   No dislocation. IMPRESSION: Comminuted, nondisplaced acute fracture of the distal radial metadiaphysis. Electronically Signed   By: Neita Garnet M.D.   On: 10/01/2022 17:16    Procedures Procedures    Medications Ordered in ED Medications  HYDROcodone-acetaminophen (NORCO/VICODIN) 5-325 MG per tablet 1 tablet (1 tablet Oral Given 10/01/22 1647)    ED Course/ Medical Decision Making/ A&P    Patient seen and examined. History obtained directly from patient. Work-up including labs, imaging, EKG ordered in triage, if performed, were reviewed.    Labs/EKG: None ordered  Imaging: Independently reviewed and interpreted.  This included: X-ray of the right wrist, distal radius fracture noted.  Medications/Fluids: P.o. Vicodin given on arrival  Most recent vital signs reviewed and are as follows: BP (!) 127/106   Pulse 75   Temp 98.4 F (36.9 C)   Resp (!) 21   SpO2 100%   Initial impression: Distal radius fracture  Plan: Short arm sugar-tong splint will be placed, will obtain a humerus x-ray to screen for elbow and shoulder injury given associated tenderness.  6:22 PM Reassessment performed. Patient appears stable.  Distal circulation, motor, sensation intact after splint placement.  Imaging personally visualized and interpreted including: X-ray of the humerus was negative.  Reviewed pertinent lab work and imaging with patient at bedside. Questions answered.   Most current vital signs reviewed and are as follows: BP (!) 127/106   Pulse 75   Temp 98.4 F (36.9 C)   Resp (!) 21   SpO2 100%   Plan: Discharge to home.   Prescriptions written for: Vicodin # 10 tablets  Patient counseled on use of narcotic pain medications. Counseled not to combine these medications with others containing tylenol. Urged not to drink alcohol, drive, or perform any other activities that requires focus while taking these medications. The patient verbalizes understanding and agrees with the  plan.  Other home care instructions discussed: RICE protocol, also NSAIDs for pain.  ED return instructions discussed: New or worsening symptoms  Follow-up instructions discussed: Patient encouraged to follow-up with their orthopedist.                             Medical Decision Making Amount and/or Complexity of Data Reviewed Radiology: ordered.  Risk Prescription drug management.   Patient with distal radius fracture which is fairly well aligned.  Distal circulation, motor, and sensation intact.  Patient splinted here.  She had elbow and shoulder pain, imaging negative.  Patient will need to follow-up with orthopedics.  She is given some pain medication to use at home and instructions.  No signs of compartment syndrome during ED stay.  The patient's vital signs, pertinent lab work and imaging were reviewed and interpreted as discussed in the ED  course. Hospitalization was considered for further testing, treatments, or serial exams/observation. However as patient is well-appearing, has a stable exam, and reassuring studies today, I do not feel that they warrant admission at this time. This plan was discussed with the patient who verbalizes agreement and comfort with this plan and seems reliable and able to return to the Emergency Department with worsening or changing symptoms.          Final Clinical Impression(s) / ED Diagnoses Final diagnoses:  Closed fracture of distal end of right radius, unspecified fracture morphology, initial encounter    Rx / DC Orders ED Discharge Orders          Ordered    HYDROcodone-acetaminophen (NORCO/VICODIN) 5-325 MG tablet  Every 6 hours PRN        10/01/22 1820              Renne Crigler, PA-C 10/01/22 1827    Sloan Leiter, DO 10/01/22 2352

## 2022-10-01 NOTE — ED Provider Triage Note (Signed)
Emergency Medicine Provider Triage Evaluation Note  Tierre Bellon , a 42 y.o. female  was evaluated in triage.  Pt complains of right wrist and shoulder pain after a fall onto an outstretched right hand approximate 20 minutes prior to arrival.  Patient was on skates when she fell.  No head injuries.  No treatments prior to arrival.  Pain worse with motion.  Review of Systems  Positive: Wrist and shoulder pain Negative: Head injury  Physical Exam  BP (!) 127/106   Pulse 75   Temp 98.4 F (36.9 C)   Resp (!) 21   SpO2 100%  Gen:   Awake, no distress   Resp:  Normal effort  MSK:   Moves extremities without difficulty  Other:  Patient appears uncomfortable, holding wrist, no obvious deformity, 2+ radial pulse  Medical Decision Making  Medically screening exam initiated at 4:39 PM.  Appropriate orders placed.  Varetta Browell was informed that the remainder of the evaluation will be completed by another provider, this initial triage assessment does not replace that evaluation, and the importance of remaining in the ED until their evaluation is complete.     Renne Crigler, PA-C 10/01/22 1640

## 2022-10-01 NOTE — ED Triage Notes (Addendum)
Fell while skating. Landed on right hand. Comes in with right wrist pain and swelling. Right shoulder pain as well. Denies head trauma.

## 2022-10-02 DIAGNOSIS — S52501A Unspecified fracture of the lower end of right radius, initial encounter for closed fracture: Secondary | ICD-10-CM | POA: Diagnosis not present

## 2022-10-02 DIAGNOSIS — M25531 Pain in right wrist: Secondary | ICD-10-CM | POA: Diagnosis not present

## 2022-10-05 DIAGNOSIS — Y999 Unspecified external cause status: Secondary | ICD-10-CM | POA: Diagnosis not present

## 2022-10-05 DIAGNOSIS — X58XXXA Exposure to other specified factors, initial encounter: Secondary | ICD-10-CM | POA: Diagnosis not present

## 2022-10-05 DIAGNOSIS — S52571A Other intraarticular fracture of lower end of right radius, initial encounter for closed fracture: Secondary | ICD-10-CM | POA: Diagnosis not present

## 2022-10-18 DIAGNOSIS — M25531 Pain in right wrist: Secondary | ICD-10-CM | POA: Diagnosis not present

## 2022-11-01 DIAGNOSIS — M25631 Stiffness of right wrist, not elsewhere classified: Secondary | ICD-10-CM | POA: Diagnosis not present

## 2022-11-01 DIAGNOSIS — M25531 Pain in right wrist: Secondary | ICD-10-CM | POA: Diagnosis not present

## 2022-12-11 DIAGNOSIS — S52501A Unspecified fracture of the lower end of right radius, initial encounter for closed fracture: Secondary | ICD-10-CM | POA: Diagnosis not present

## 2023-02-18 ENCOUNTER — Other Ambulatory Visit (HOSPITAL_COMMUNITY)
Admission: RE | Admit: 2023-02-18 | Discharge: 2023-02-18 | Disposition: A | Discharge status: Home / Self Care | Payer: BC Managed Care – PPO | Source: Ambulatory Visit | Attending: Anesthesiology | Admitting: Anesthesiology

## 2023-02-18 ENCOUNTER — Encounter: Payer: Self-pay | Admitting: Family Medicine

## 2023-02-18 ENCOUNTER — Ambulatory Visit: Payer: BC Managed Care – PPO | Admitting: Family Medicine

## 2023-02-18 VITALS — BP 134/82 | HR 89 | Temp 97.9°F | Ht 65.0 in | Wt 241.1 lb

## 2023-02-18 DIAGNOSIS — B9689 Other specified bacterial agents as the cause of diseases classified elsewhere: Secondary | ICD-10-CM | POA: Diagnosis not present

## 2023-02-18 DIAGNOSIS — R8290 Unspecified abnormal findings in urine: Secondary | ICD-10-CM | POA: Insufficient documentation

## 2023-02-18 DIAGNOSIS — N760 Acute vaginitis: Secondary | ICD-10-CM | POA: Diagnosis not present

## 2023-02-18 LAB — POCT URINALYSIS DIPSTICK
Bilirubin, UA: NEGATIVE
Blood, UA: NEGATIVE
Glucose, UA: NEGATIVE
Ketones, UA: NEGATIVE
Leukocytes, UA: NEGATIVE
Nitrite, UA: NEGATIVE
Protein, UA: POSITIVE — AB
Spec Grav, UA: 1.02 (ref 1.010–1.025)
Urobilinogen, UA: 0.2 U/dL
pH, UA: 6 (ref 5.0–8.0)

## 2023-02-18 NOTE — Progress Notes (Signed)
Subjective:  Patient ID: Zoe Soto, female    DOB: 07/26/1980  Age: 42 y.o. MRN: [id number]  CC:  Chief Complaint  Patient presents with   urine odor    Pt is here today with C/O Urine odor Pt reports she noticed 4 days of odor. Pt reports no other issues.    HPI Zoe Soto presents for   Malodorous urine Noticed past 4 days or so. No dysuria/urgency/frequency. No hematuria.  No new vaginal discharge, lesions/rash, or itching. No abnormal bleeding. LMP 9/23 - normal. Boric acid suppository after menses - nothing new.  Had BV in past, not typical sx's.  Drinking sufficient water - in past had malodorous urine when dehydrated.   Chronic proteinuria, eval in past including with nephrology - no concerns. 2009.  No recent unprotected intercourse, same partner.  Hx of HSV - no recent outbreaks.  No recent abx.  Lab Results  Component Value Date   CREATININE 1.07 04/05/2021      History Patient Active Problem List   Diagnosis Date Noted   Vaginal discharge 09/27/2020   Anxiety 09/26/2020   Depressive disorder 09/26/2020   Environmental and seasonal allergies 07/20/2020   Eustachian tube dysfunction 07/20/2020   Asthma    Heart palpitations 05/13/2019   Paroxysmal SVT (supraventricular tachycardia) (HCC) 05/13/2019   PAC (premature atrial contraction) 05/13/2019   PVC (premature ventricular contraction) 05/13/2019   H/O laparoscopy 06/27/2018   Anemia 06/04/2018   Herpes simplex virus type 2 (HSV-2) infection affecting pregnancy in third trimester 02/21/2018   Intramural leiomyoma of uterus 05/26/2013   Irregular menstruation 08/04/2010   Past Medical History:  Diagnosis Date   Allergy    Seasonal   Asthma    Bee sting allergy    GERD (gastroesophageal reflux disease)    Herpes simplex virus (HSV) infection    History of chickenpox    Pneumonia 08/2017   RESOLVED   Past Surgical History:  Procedure Laterality Date   ABDOMINOPLASTY     ANKLE SURGERY  Right 2011   DILITATION & CURRETTAGE/HYSTROSCOPY WITH NOVASURE ABLATION N/A 06/27/2018   Procedure: DILATATION & CURETTAGE/HYSTEROSCOPY WITH NOVASURE ABLATION;  Surgeon: Ranae Pila, MD;  Location: [facility];  Service: Gynecology;  Laterality: N/A;   GASTRIC BYPASS  07/2015   GASTRIC BYPASS     KNEE ARTHROSCOPY Left 2010   LAPAROSCOPIC TUBAL LIGATION Bilateral 06/27/2018   Procedure: LAPAROSCOPIC TUBAL LIGATION;  Surgeon: Ranae Pila, MD;  Location: [facility];  Service: Gynecology;  Laterality: Bilateral;   SMALL BOWEL REPAIR N/A 06/27/2018   Procedure: SMALL BOWEL REPAIR;  Surgeon: Ranae Pila, MD;  Location: [facility];  Service: Gynecology;  Laterality: N/A;   Allergies  Allergen Reactions   Wasp Venom Anaphylaxis and Hives   Naproxen Other (See Comments)    Hematuria when taken in large doses   Beeswax    Other     Ants   Tape Rash    ADHESIVE AND PAPER TAPE REMOVES SKIN   Prior to Admission medications   Medication Sig Start Date End Date Taking? Authorizing Provider  albuterol (VENTOLIN HFA) 108 (90 Base) MCG/ACT inhaler Inhale 2 puffs into the lungs every 6 (six) hours as needed for wheezing or shortness of breath. 07/18/22  Yes Sheliah Hatch, MD  budesonide (PULMICORT FLEXHALER) 180 MCG/ACT inhaler Inhale 2 puffs into the lungs daily as needed. 04/05/21  Yes Janeece Agee, NP  EPINEPHrine 0.3 mg/0.3 mL IJ SOAJ injection  Inject 0.3 mg into the muscle as needed for anaphylaxis. 12/04/21  Yes Pollyann Savoy, MD  FLOVENT HFA 110 MCG/ACT inhaler Inhale 2 puffs into the lungs daily as needed. 07/23/22  Yes Sheliah Hatch, MD  FLUoxetine (PROZAC) 20 MG capsule TAKE 1 CAPSULE BY MOUTH EVERY DAY 08/06/22  Yes Sheliah Hatch, MD  FLUTICASONE PROPIONATE, NASAL, NA Place 1-2 sprays into the nose as needed (for allergies).   Yes [provider]  HYDROcodone-acetaminophen (NORCO/VICODIN)  5-325 MG tablet Take 1 tablet by mouth every 6 (six) hours as needed for severe pain. Patient not taking: Reported on 02/18/2023 10/01/22   Renne Crigler, PA-C  valACYclovir (VALTREX) 500 MG tablet Take 1 tablet (500 mg total) by mouth 2 (two) times daily. Patient not taking: Reported on 02/18/2023 08/22/22   Waldon Merl, PA-C   Social History   Socioeconomic History   Marital status: Widowed    Spouse name: Not on file   Number of children: 2   Years of education: 58   Highest education level: Not on file  Occupational History   Occupation: Administration  Tobacco Use   Smoking status: Former   Smokeless tobacco: Never  Vaping Use   Vaping status: Never Used  Substance and Sexual Activity   Alcohol use: Yes    Alcohol/week: 4.0 standard drinks of alcohol    Types: 4 Glasses of wine per week    Comment: 4 glasses of wine per week    Drug use: No   Sexual activity: Not Currently    Birth control/protection: None  Other Topics Concern   Not on file  Social History Narrative   Not on file   Social Determinants of Health   Financial Resource Strain: Not on file  Food Insecurity: Not on file  Transportation Needs: Not on file  Physical Activity: Not on file  Stress: Not on file  Social Connections: Not on file  Intimate Partner Violence: Not on file    Review of Systems   Objective:   Vitals:   02/18/23 1449  BP: 134/82  Pulse: 89  Temp: 97.9 F (36.6 C)  SpO2: 94%  Weight: 241 lb 2 oz (109.4 kg)  Height: 5\' 5"  (1.651 m)     Physical Exam Constitutional:      Appearance: Normal appearance. She is well-developed.  HENT:     Head: Normocephalic and atraumatic.  Pulmonary:     Effort: Pulmonary effort is normal.  Abdominal:     General: There is no distension.     Palpations: Abdomen is soft.     Tenderness: There is no abdominal tenderness. There is no right CVA tenderness, left CVA tenderness, guarding or rebound.  Genitourinary:    Comments:  Exam deferred.  Skin:    General: Skin is warm.  Neurological:     Mental Status: She is alert and oriented to person, place, and time.  Psychiatric:        Behavior: Behavior normal.    Results for orders placed or performed in visit on 02/18/23  POCT urinalysis dipstick  Result Value Ref Range   Color, UA     Clarity, UA     Glucose, UA Negative Negative   Bilirubin, UA NEG    Ketones, UA NEG    Spec Grav, UA 1.020 1.010 - 1.025   Blood, UA NEG    pH, UA 6.0 5.0 - 8.0   Protein, UA Positive (A) Negative   Urobilinogen, UA 0.2 0.2  or 1.0 E.U./dL   Nitrite, UA NEG    Leukocytes, UA Negative Negative   Appearance     Odor         Assessment & Plan:  Zoe Soto is a 42 y.o. female . Abnormal urine odor - Plan: POCT urinalysis dipstick, Cervicovaginal ancillary only( [place])  Bacterial vaginosis - Plan: Cervicovaginal ancillary only( [place]) Abnormal urine odor without other symptoms as above.  No sign of infection seen on in office urinalysis.  Chronic proteinuria.  Denies any new vaginal discharge but potential contributor to odor, history of bacterial vaginosis.  Wet prep ordered, no new treatment for now.  No orders of the defined types were placed in this encounter.  Patient Instructions  Other than protein which has been present previously I do not see any concerns on the urine test today.  If the vaginal discharge swab is also negative or normal and symptoms continue, please follow-up and we can look into other potential causes or further testing.  Please be seen if any new symptoms.  Let me know if there are questions.    Signed,   Meredith Staggers, MD [facility], [facility] 02/18/23 3:23 PM

## 2023-02-18 NOTE — Patient Instructions (Signed)
Other than protein which has been present previously I do not see any concerns on the urine test today.  If the vaginal discharge swab is also negative or normal and symptoms continue, please follow-up and we can look into other potential causes or further testing.  Please be seen if any new symptoms.  Let me know if there are questions.

## 2023-02-21 LAB — CERVICOVAGINAL ANCILLARY ONLY
Bacterial Vaginitis (gardnerella): NEGATIVE
Candida Glabrata: NEGATIVE
Candida Vaginitis: NEGATIVE
Chlamydia: NEGATIVE
Comment: NEGATIVE
Comment: NEGATIVE
Comment: NEGATIVE
Comment: NEGATIVE
Comment: NEGATIVE
Comment: NORMAL
Neisseria Gonorrhea: NEGATIVE
Trichomonas: NEGATIVE

## 2023-02-26 ENCOUNTER — Telehealth: Payer: Self-pay

## 2023-02-26 NOTE — Telephone Encounter (Signed)
-----   Message from Shade Flood sent at 02/26/2023  7:49 AM EDT ----- Results sent by MyChart, but appears patient has not yet reviewed those results.  Please call and make sure they have either seen note or discuss result note.  Thanks.

## 2023-04-19 ENCOUNTER — Encounter: Payer: Self-pay | Admitting: Family Medicine

## 2023-04-19 DIAGNOSIS — F419 Anxiety disorder, unspecified: Secondary | ICD-10-CM

## 2023-04-19 DIAGNOSIS — F410 Panic disorder [episodic paroxysmal anxiety] without agoraphobia: Secondary | ICD-10-CM

## 2023-04-19 MED ORDER — CLONAZEPAM 0.5 MG PO TBDP: 0.5000 mg | ORAL_TABLET | Freq: Three times a day (TID) | ORAL | 1 refills | Status: AC | PRN

## 2023-04-22 ENCOUNTER — Encounter: Payer: Self-pay | Admitting: Family Medicine

## 2023-04-23 NOTE — Telephone Encounter (Signed)
Sent PA request message to the pharmacy team in [place] chart as Zoe Soto is not the patient in question

## 2023-04-23 NOTE — Telephone Encounter (Signed)
Has there been a PA started for this patients Budesonide?

## 2023-05-16 ENCOUNTER — Other Ambulatory Visit: Payer: Self-pay | Admitting: Family Medicine

## 2023-05-16 DIAGNOSIS — F4321 Adjustment disorder with depressed mood: Secondary | ICD-10-CM

## 2023-07-30 ENCOUNTER — Other Ambulatory Visit: Payer: Self-pay | Admitting: Family Medicine

## 2023-07-30 DIAGNOSIS — J4520 Mild intermittent asthma, uncomplicated: Secondary | ICD-10-CM

## 2023-08-09 ENCOUNTER — Encounter: Admitting: Family Medicine

## 2023-09-19 ENCOUNTER — Ambulatory Visit: Admitting: Family Medicine

## 2023-09-24 ENCOUNTER — Encounter: Admitting: Family Medicine

## 2023-11-11 ENCOUNTER — Telehealth

## 2023-11-11 DIAGNOSIS — B3731 Acute candidiasis of vulva and vagina: Secondary | ICD-10-CM

## 2023-11-12 MED ORDER — FLUCONAZOLE 150 MG PO TABS: 150.0000 mg | ORAL_TABLET | Freq: Once | ORAL | 0 refills | Status: AC

## 2023-11-12 NOTE — Progress Notes (Signed)
 I have spent 5 minutes in review of e-visit questionnaire, review and updating patient chart, medical decision making and response to patient.   Piedad Climes, PA-C

## 2023-11-12 NOTE — Progress Notes (Signed)

## 2023-11-15 ENCOUNTER — Encounter: Payer: Self-pay | Admitting: Family Medicine

## 2023-11-15 DIAGNOSIS — Z87892 Personal history of anaphylaxis: Secondary | ICD-10-CM

## 2023-11-15 MED ORDER — EPINEPHRINE 0.3 MG/0.3ML IJ SOAJ: 0.3000 mg | INTRAMUSCULAR | 1 refills | Status: DC | PRN

## 2023-12-01 ENCOUNTER — Other Ambulatory Visit: Payer: Self-pay | Admitting: Family Medicine

## 2023-12-01 DIAGNOSIS — F4321 Adjustment disorder with depressed mood: Secondary | ICD-10-CM

## 2023-12-02 ENCOUNTER — Emergency Department (HOSPITAL_BASED_OUTPATIENT_CLINIC_OR_DEPARTMENT_OTHER)

## 2023-12-02 ENCOUNTER — Emergency Department (HOSPITAL_BASED_OUTPATIENT_CLINIC_OR_DEPARTMENT_OTHER)
Admission: EM | Admit: 2023-12-02 | Discharge: 2023-12-02 | Disposition: A | Discharge status: Home / Self Care | Attending: Emergency Medicine | Admitting: Emergency Medicine

## 2023-12-02 ENCOUNTER — Other Ambulatory Visit: Payer: Self-pay

## 2023-12-02 ENCOUNTER — Emergency Department (HOSPITAL_BASED_OUTPATIENT_CLINIC_OR_DEPARTMENT_OTHER): Admitting: Radiology

## 2023-12-02 DIAGNOSIS — R519 Headache, unspecified: Secondary | ICD-10-CM | POA: Insufficient documentation

## 2023-12-02 DIAGNOSIS — R002 Palpitations: Secondary | ICD-10-CM | POA: Diagnosis not present

## 2023-12-02 DIAGNOSIS — R7989 Other specified abnormal findings of blood chemistry: Secondary | ICD-10-CM | POA: Diagnosis not present

## 2023-12-02 DIAGNOSIS — R0789 Other chest pain: Secondary | ICD-10-CM | POA: Diagnosis not present

## 2023-12-02 DIAGNOSIS — R200 Anesthesia of skin: Secondary | ICD-10-CM | POA: Diagnosis not present

## 2023-12-02 LAB — CBC
HCT: 33.8 % — ABNORMAL LOW (ref 36.0–46.0)
Hemoglobin: 10.4 g/dL — ABNORMAL LOW (ref 12.0–15.0)
MCH: 21.4 pg — ABNORMAL LOW (ref 26.0–34.0)
MCHC: 30.8 g/dL (ref 30.0–36.0)
MCV: 69.7 fL — ABNORMAL LOW (ref 80.0–100.0)
Platelets: 470 K/uL — ABNORMAL HIGH (ref 150–400)
RBC: 4.85 MIL/uL (ref 3.87–5.11)
RDW: 22.5 % — ABNORMAL HIGH (ref 11.5–15.5)
WBC: 5.4 K/uL (ref 4.0–10.5)
nRBC: 0 % (ref 0.0–0.2)

## 2023-12-02 LAB — BASIC METABOLIC PANEL WITH GFR
Anion gap: 12 (ref 5–15)
BUN: 11 mg/dL (ref 6–20)
CO2: 23 mmol/L (ref 22–32)
Calcium: 9.4 mg/dL (ref 8.9–10.3)
Chloride: 103 mmol/L (ref 98–111)
Creatinine, Ser: 1.3 mg/dL — ABNORMAL HIGH (ref 0.44–1.00)
GFR, Estimated: 52 mL/min — ABNORMAL LOW (ref >60)
Glucose, Bld: 80 mg/dL (ref 70–99)
Potassium: 4.1 mmol/L (ref 3.5–5.1)
Sodium: 138 mmol/L (ref 135–145)

## 2023-12-02 LAB — PREGNANCY, URINE: Preg Test, Ur: NEGATIVE

## 2023-12-02 LAB — D-DIMER, QUANTITATIVE: D-Dimer, Quant: 0.48 ug{FEU}/mL (ref 0.00–0.50)

## 2023-12-02 LAB — TROPONIN T, HIGH SENSITIVITY: Troponin T High Sensitivity: <15 ng/L (ref <19)

## 2023-12-02 MED ORDER — ACETAMINOPHEN 500 MG PO TABS
1000.0000 mg | ORAL_TABLET | Freq: Once | ORAL | Status: AC
  Administered 2023-12-02: 1000 mg via ORAL
  Filled 2023-12-02: qty 2

## 2023-12-02 MED ORDER — METHOCARBAMOL 500 MG PO TABS: 500.0000 mg | ORAL_TABLET | Freq: Two times a day (BID) | ORAL | 0 refills | Status: DC | PRN

## 2023-12-02 MED ORDER — KETOROLAC TROMETHAMINE 15 MG/ML IJ SOLN
15.0000 mg | Freq: Once | INTRAMUSCULAR | Status: AC
  Administered 2023-12-02: 15 mg via INTRAVENOUS
  Filled 2023-12-02: qty 1

## 2023-12-02 MED ORDER — ONDANSETRON HCL 4 MG/2ML IJ SOLN
4.0000 mg | Freq: Once | INTRAMUSCULAR | Status: AC
  Administered 2023-12-02: 4 mg via INTRAVENOUS
  Filled 2023-12-02: qty 2

## 2023-12-02 MED ORDER — LACTATED RINGERS IV BOLUS
1000.0000 mL | Freq: Once | INTRAVENOUS | Status: AC
  Administered 2023-12-02: 1000 mL via INTRAVENOUS

## 2023-12-02 NOTE — ED Triage Notes (Signed)
 Pt c/o left arm pain with left jaw and ear pain that began today, along with intermittent feeling that her heart is racing with mild nausea.  H/o irregular heartbeat - not currently on any rhythm control meds but has used them in the past. Pt c/o mild SOB with h/o asthma and states SOB dissipated with inhaler use yesterday.

## 2023-12-02 NOTE — ED Provider Notes (Signed)
  Physical Exam  BP (!) 158/96   Pulse (!) 59   Temp 98.9 F (37.2 C) (Oral)   Resp 12   LMP 11/28/2023 (Approximate)   SpO2 100%   Physical Exam Vitals and nursing note reviewed.  Constitutional:      General: She is not in acute distress.    Appearance: She is not ill-appearing or toxic-appearing.     Comments: On phone, facetiming family member  Eyes:     Extraocular Movements: Extraocular movements intact.  Neck:      Comments: Tenderness to the left trap with palpable muscle spasm Cardiovascular:     Rate and Rhythm: Normal rate.  Pulmonary:     Effort: Pulmonary effort is normal.     Breath sounds: No decreased breath sounds.  Skin:    General: Skin is warm and dry.  Neurological:     Mental Status: She is alert.     GCS: GCS eye subscore is 4. GCS verbal subscore is 5. GCS motor subscore is 6.     Cranial Nerves: No cranial nerve deficit, dysarthria or facial asymmetry.     Sensory: No sensory deficit.     Motor: No weakness.     Procedures  Procedures  ED Course / MDM   Clinical Course as of 12/02/23 1900  Mon Dec 02, 2023  1821 Patient re-checked. Reporting no further abnormal sensation on the left side of the face but now reporting headache on the right. Does not normally get headaches. She requests CT head which I feel is appropriate for further evaluation [AF]    Clinical Course User Index [AF] Veta Palma, PA-C   Medical Decision Making Amount and/or Complexity of Data Reviewed Labs: ordered. Radiology: ordered.  Risk OTC drugs. Prescription drug management.   Accepted handoff at shift change from [facility]. Please see prior provider note for more detail.   Briefly: Patient is 43 y.o. F presents to the ER for evaluation of jaw, arm, and chest pain on the left. Work up was unremarkable, but the patient then started to develop headache on the right. Was reporting some left side neck tingling/numbness, that has since resolved.  Asymptomatic now other than headache.   CT Head unremarkable.   Will give patient some Toradol  to see if this helps relieve her headache.  At reevaluation, patient reports her headache has resolved and is feeling much better.  Her kidney functions mildly elevated, could be having some dehydration/headache from not eating all day.  She is given a snack as well.  Will give her muscle laxer for probable muscle spasm.  Recommended her follow-up with neurology.  I do not think she needs any emergent MRI, doubt this is any stroke.  She is no longer feeling the numbness/tingling sensation.  Patient is currently on menstrual cycle, could be having some migrainous process.  Doubt any meningitis.  Normal sensation without any focal deficit.  Strength is intact.  We discussed return precautions red flag symptoms.  Patient verbalized understanding and agrees to the plan.  Patient is stable being discharged home in good condition.       Bernis Ernst, PA-C 12/02/23 2239    Dreama Longs, MD 12/03/23 1041

## 2023-12-02 NOTE — Discharge Instructions (Addendum)
 Your labs today show that you have a low hemoglobin which is one of your blood counts.  This is called an anemia.  This could be a sign of an iron  deficiency.  Please follow-up with your PCP within the next month for further evaluation.  Your creatinine which is your kidney function was abnormal today.  This likely indicates you were dehydrated today.  You are given some fluids help with this.  Please keep well-hydrated at home.  Please have your PCP repeat this value within the next month for further monitoring.  Your blood pressure was elevated here today at 149/93. Aim to get 30 minutes of exercise daily and limit intake of processed/fast foods. Please take and record your blood pressures at home. Take them at the same time every day. Follow up with your PCP within the next month to discuss if you may need treatment for your blood pressure.  Your heart test today (troponin) was normal.  This would be elevated if you are having a heart attack.  Your EKG which is your heart rhythm was also normal today.  Your chest x-ray did not show any abnormalities  Please return to the ER for any one-sided arm or leg weakness, facial droop, slurred speech, severe headache not controlled with home medications, persistent vomiting, fevers, any other new or concerning symptoms   I have sent an ambulatory referral to neurology.  They should call you in the next few days however if you do not hear from them, please make sure you call to schedule an appointment.  I have sent in a muscle relaxer.  Please take as prescribed.  Do not drive or operate heavy machinery while on this medication as it will make you sleepy.

## 2023-12-02 NOTE — ED Provider Notes (Signed)
 [place] [facility] DEPARTMENT AT [facility] Provider Note   CSN: [id number] Arrival date & time: 12/02/23  1438     Patient presents with: Chest Pain   Zoe Soto is a 43 y.o. female history of paroxysmal SVT and PVCs, presents with concern for abnormal sensation of left jaw left side of her neck that began this morning.  She reports that she also noticed a stuffy sensation in her ears bilaterally this morning.  She also reports feeling somewhat nauseous, no vomiting. Reported an itchy feeling in her chest yesterday, but took her inhaler yesterday which did resolve her symptoms.  Reports she intermittently feels her heart race.  Denies any chest pain or shortness of breath.  Denies any lower extremity pain or swelling.  She does report a 4-hour car ride to [place]  last weekend.  No other long plane or car rides.  No recent hospitalizations or surgeries.  She is not on any hormonal medications and denies any history of blood clot.  Denies any weakness of the upper or lower extremities bilaterally, no numbness or tingling in the upper or lower extremities bilaterally    Chest Pain      Prior to Admission medications   Medication Sig Start Date End Date Taking? Authorizing Provider  albuterol  (VENTOLIN  HFA) 108 (90 Base) MCG/ACT inhaler Inhale 2 puffs into the lungs every 6 (six) hours as needed for wheezing or shortness of breath. 07/18/22   Tabori, Katherine E, MD  budesonide  (PULMICORT  FLEXHALER) 180 MCG/ACT inhaler Inhale 2 puffs into the lungs daily as needed. 04/05/21   Kip Ade, NP  clonazePAM  (KLONOPIN ) 0.5 MG disintegrating tablet Take 1-2 tablets (0.5-1 mg total) by mouth 3 (three) times daily as needed (anxiety). 04/19/23   Tabori, Katherine E, MD  EPINEPHrine  0.3 mg/0.3 mL IJ SOAJ injection Inject 0.3 mg into the muscle as needed for anaphylaxis. 11/15/23   Tabori, Katherine E, MD  FLOVENT  HFA 110 MCG/ACT inhaler Inhale 2 puffs into the lungs daily as  needed. 07/23/22   Mahlon Comer BRAVO, MD  FLUoxetine  (PROZAC ) 20 MG capsule TAKE 1 CAPSULE BY MOUTH EVERY DAY 05/17/23   Tabori, Katherine E, MD  FLUTICASONE  PROPIONATE, NASAL, NA Place 1-2 sprays into the nose as needed (for allergies).    [provider]  HYDROcodone -acetaminophen  (NORCO/VICODIN) 5-325 MG tablet Take 1 tablet by mouth every 6 (six) hours as needed for severe pain. Patient not taking: Reported on 02/18/2023 10/01/22   Desiderio Chew, PA-C  valACYclovir  (VALTREX ) 500 MG tablet Take 1 tablet (500 mg total) by mouth 2 (two) times daily. Patient not taking: Reported on 02/18/2023 08/22/22   Gladis Elsie BROCKS, PA-C    Allergies: Wasp venom, Naproxen, Beeswax, Other, and Tape    Review of Systems  Cardiovascular:  Positive for chest pain.    Updated Vital Signs BP (!) 158/96   Pulse (!) 59   Temp 98.9 F (37.2 C) (Oral)   Resp 12   LMP 11/28/2023 (Approximate)   SpO2 100%   Physical Exam Vitals and nursing note reviewed.  Constitutional:      General: She is not in acute distress.    Appearance: She is well-developed.  HENT:     Head: Normocephalic and atraumatic.     Right Ear: Tympanic membrane, ear canal and external ear normal.     Left Ear: Tympanic membrane, ear canal and external ear normal.  Eyes:     Extraocular Movements: Extraocular movements intact.     Conjunctiva/sclera: Conjunctivae normal.  Pupils: Pupils are equal, round, and reactive to light.  Cardiovascular:     Rate and Rhythm: Normal rate and regular rhythm.     Heart sounds: No murmur heard.    Comments: 2+ pedal pulses and 2+ radial pulses bilaterally Pulmonary:     Effort: Pulmonary effort is normal. No respiratory distress.     Breath sounds: Normal breath sounds.  Abdominal:     Palpations: Abdomen is soft.     Tenderness: There is no abdominal tenderness.  Musculoskeletal:        General: No swelling.     Cervical back: Neck supple.     Right lower leg: No edema.      Left lower leg: No edema.     Comments: No calf TTP  Skin:    General: Skin is warm and dry.     Capillary Refill: Capillary refill takes less than 2 seconds.  Neurological:     General: No focal deficit present.     Mental Status: She is alert.     Comments: Mental status: Alert and oriented to self, place, and month  Speech: Answers questions appropriately  Cranial Nerves: III, IV, VI: EOM intact, Pupils equal round and reactive, no gaze preference or deviation, no nystagmus. V: normal sensation in V1, V2, and V3 segments bilaterally VII: smiles, puffs cheeks, raises eyebrows, and closes eyes without asymmetry.  VIII: normal hearing to speech IX, X: normal palatal elevation, no uvular deviation XI: 5/5 head turn and 5/5 shoulder shrug bilaterally XII: midline tongue protrusion  Motor: 5/5 strength with hand squeeze bilaterally, ankle plantarflexion and dorsiflexion bilaterally   Sensory: Intact sensation in upper and lower extremity bilaterally     Psychiatric:        Mood and Affect: Mood normal.     (all labs ordered are listed, but only abnormal results are displayed) Labs Reviewed  CBC - Abnormal; Notable for the following components:      Result Value   Hemoglobin 10.4 (*)    HCT 33.8 (*)    MCV 69.7 (*)    MCH 21.4 (*)    RDW 22.5 (*)    Platelets 470 (*)    All other components within normal limits  BASIC METABOLIC PANEL WITH GFR - Abnormal; Notable for the following components:   Creatinine, Ser 1.30 (*)    GFR, Estimated 52 (*)    All other components within normal limits  PREGNANCY, URINE  D-DIMER, QUANTITATIVE (NOT AT [facility])  TROPONIN T, HIGH SENSITIVITY    EKG: EKG Interpretation Date/Time:  Monday December 02 2023 15:07:13 EDT Ventricular Rate:  66 PR Interval:  186 QRS Duration:  85 QT Interval:  418 QTC Calculation: 438 R Axis:   71  Text Interpretation: Sinus rhythm Confirmed by Ula Barter [id number]) on 12/02/2023 3:17:30 PM  Radiology: DG  Chest 2 View Result Date: 12/02/2023 CLINICAL DATA:  Palpitations. EXAM: CHEST - 2 VIEW COMPARISON:  12/21/2020. FINDINGS: Bilateral lung fields are clear. Bilateral costophrenic angles are clear. Normal cardio-mediastinal silhouette. No acute osseous abnormalities. The soft tissues are within normal limits. IMPRESSION: No active cardiopulmonary disease. Electronically Signed   By: Ree Molt M.D.   On: 12/02/2023 15:59     Procedures   Medications Ordered in the ED  lactated ringers  bolus 1,000 mL (1,000 mLs Intravenous New Bag/Given 12/02/23 1839)  acetaminophen  (TYLENOL ) tablet 1,000 mg (1,000 mg Oral Given 12/02/23 1849)  ondansetron  (ZOFRAN ) injection 4 mg (4 mg Intravenous Given 12/02/23 1852)  Clinical Course as of 12/02/23 1857  Mon Dec 02, 2023  1821 Patient re-checked. Reporting no further abnormal sensation on the left side of the face but now reporting headache on the right. Does not normally get headaches. She requests CT head which I feel is appropriate for further evaluation [AF]    Clinical Course User Index [AF] Veta Palma, PA-C                                 Medical Decision Making Amount and/or Complexity of Data Reviewed Labs: ordered. Radiology: ordered.     Differential diagnosis includes but is not limited to Viral URI, Otitis media, eustachian tube dysfunction, ACS, arrhythmia, aortic aneurysm, pericarditis, myocarditis, pericardial effusion, cardiac tamponade, musculoskeletal pain, GERD, Boerhaave's syndrome, DVT/PE, pneumonia, pleural effusion   ED Course:  Upon initial evaluation, patient is well-appearing, no acute distress.  Stable vitals aside from mildly elevated blood pressure of 149/93.  Reporting some intermittent abnormal sensation to the left side of her jaw and neck that started this morning. On exam, no neurologic deficits.  I have low concern for CVA or TIA.  Also reporting a stuffy sensation in her ears bilaterally.  TMs  bilaterally without any erythema or bulging.  No signs of otitis externa.  She is not complaining of any chest pain or shortness of breath.  However, given she reports her heart will race intermittently, will obtain troponins and EKG.   Labs Ordered: I Ordered, and personally interpreted labs.  The pertinent results include:   CBC without leukocytosis.  Mild anemia with hemoglobin 10.4 BMP with elevated creatinine 1.3.  It seems baseline is below 1.  No other electrolyte abnormalities Pregnancy negative Dimer within normal limits Troponin within normal limits  Imaging Studies ordered: I ordered imaging studies including chest x-ray, CT head I independently visualized the imaging with scope of interpretation limited to determining acute life threatening conditions related to emergency care. Imaging showed  Chest x-ray without acute abnormality CT head pending I agree with the radiologist interpretation   Cardiac Monitoring: / EKG: The patient was maintained on a cardiac monitor.  I personally viewed and interpreted the cardiac monitored which showed an underlying rhythm of: Normal sinus rhythm   Medications Given: LR bolus, Tylenol , Zofran   Upon recheck patient at 6:21 PM patient remains well-appearing with still mildly elevated blood pressure but otherwise stable vitals.   Low concern for ACS at this time given troponin remains stable with troponin less than 15, no chest pain or shortness of breath, EKG with normal sinus rhythm and no ST changes. Chest x-ray without any acute abnormality. No concern for DVT or PE at this time given d-dimer within normal limits.    She reports she is now starting to have a headache on the right side of her head.  Reports that the feeling earlier today in the left side of her head was more of a numbness sensation.  She has not had any further episodes of numbness on the left side of her head.  She does not have any neurodeficits on exam.  However, she states  she normally does not have headaches, given the numbness she reported earlier, will obtain CT head for further evaluation to rule out stroke.  She is unsure of exactly what time the left side of her head started/stopped, but states this was before she came to the emergency room.    Impression: Headache  Disposition:  Care of this patient signed out to oncoming ED provider Carlo Cornish, PA-C to re-evaluate patient after medications and to follow-up on CT scan results. Disposition and treatment plan pending imaging results and clinical judgment of oncoming ED team.     This chart was dictated using voice recognition software, Dragon. Despite the best efforts of this provider to proofread and correct errors, errors may still occur which can change documentation meaning.       Final diagnoses:  Acute nonintractable headache, unspecified headache type  Elevated serum creatinine    ED Discharge Orders     None          Veta Palma, DEVONNA 12/02/23 1857    Dreama Longs, MD 12/03/23 1041

## 2023-12-03 ENCOUNTER — Encounter: Payer: Self-pay | Admitting: Neurology

## 2023-12-17 ENCOUNTER — Telehealth: Admitting: Physician Assistant

## 2023-12-17 DIAGNOSIS — H9202 Otalgia, left ear: Secondary | ICD-10-CM

## 2023-12-17 MED ORDER — AZITHROMYCIN 250 MG PO TABS: ORAL_TABLET | ORAL | 0 refills | Status: AC

## 2023-12-17 NOTE — Progress Notes (Signed)
 E-Visit for Ear Pain - Acute Otitis Media   We are sorry that you are not feeling well. Here is how we plan to help!  Based on what you have shared with me it looks like you have Acute Otitis Media.  Acute Otitis Media is an infection of the middle or inner ear. This type of infection can cause redness, inflammation, and fluid buildup behind the tympanic membrane (ear drum).  The usual symptoms include: Earache/Pain Fever Upper respiratory symptoms Lack of energy/Fatigue/Malaise Slight hearing loss gradually worsening- if the inner ear fills with fluid What causes middle ear infections? Most middle ear infections occur when an infection such as a cold, leads to a build-up of mucus in the middle ear and causes the Eustachian tube (a thin tube that runs from the middle ear to the back of the nose) to become swollen or blocked.   This means mucus can't drain away properly, making it easier for an infection to spread into the middle ear.  How middle ear infections are treated: Most ear infections clear up within three to five days and don't need any specific treatment. If necessary, tylenol  or ibuprofen should be used to relieve pain and a high temperature.  If you develop a fever higher than 102, or any significantly worsening symptoms, this could indicate a more serious infection moving to the middle/inner and needs face to face evaluation in an office by a provider.   Antibiotics aren't routinely used to treat middle ear infections, although they may occasionally be prescribed if symptoms persist or are particularly severe. Given your presentation,   I have prescribed Azithromycin  250 mg two tablets by mouth on day 1, then 1 tablet by mouth daily until completed    Your symptoms should improve over the next 3 days and should resolve in about 7 days. Be sure to complete ALL of the prescription(s) given.  HOME CARE: Wash your hands frequently. If you are prescribed an ear drop, do not  place the tip of the bottle on your ear or touch it with your fingers. You can take Acetaminophen  650 mg every 4-6 hours as needed for pain.  If pain is severe or moderate, you can apply a heating pad (set on low) or hot water bottle (wrapped in a towel) to outer ear for 20 minutes.  This will also increase drainage.  GET HELP RIGHT AWAY IF: Fever is over 102.2 degrees. You develop progressive ear pain or hearing loss. Ear symptoms persist longer than 3 days after treatment.  MAKE SURE YOU: Understand these instructions. Will watch your condition. Will get help right away if you are not doing well or get worse.  Thank you for choosing an e-visit.  Your e-visit answers were reviewed by a board certified advanced clinical practitioner to complete your personal care plan. Depending upon the condition, your plan could have included both over the counter or prescription medications.  Please review your pharmacy choice. Make sure the pharmacy is open so you can pick up the prescription now. If there is a problem, you may contact your provider through [employer] and have the prescription routed to another pharmacy.  Your safety is important to us . If you have drug allergies check your prescription carefully.   For the next 24 hours you can use MyChart to ask questions about today's visit, request a non-urgent call back, or ask for a work or school excuse. You will get an email with a survey after your eVisit asking about  your experience. We would appreciate your feedback. I hope that your e-visit has been valuable and will aid in your recovery.

## 2023-12-17 NOTE — Progress Notes (Signed)
 I have spent 5 minutes in review of e-visit questionnaire, review and updating patient chart, medical decision making and response to patient.   Elsie Velma Lunger, PA-C

## 2023-12-18 ENCOUNTER — Ambulatory Visit: Admitting: Family Medicine

## 2023-12-19 ENCOUNTER — Ambulatory Visit (INDEPENDENT_AMBULATORY_CARE_PROVIDER_SITE_OTHER): Admitting: Family Medicine

## 2023-12-19 ENCOUNTER — Encounter: Payer: Self-pay | Admitting: Family Medicine

## 2023-12-19 VITALS — BP 120/84 | HR 81 | Temp 98.0°F | Wt 220.0 lb

## 2023-12-19 DIAGNOSIS — J039 Acute tonsillitis, unspecified: Secondary | ICD-10-CM

## 2023-12-19 DIAGNOSIS — J029 Acute pharyngitis, unspecified: Secondary | ICD-10-CM

## 2023-12-19 LAB — POCT RAPID STREP A (OFFICE): Rapid Strep A Screen: NEGATIVE

## 2023-12-19 MED ORDER — CLINDAMYCIN HCL 300 MG PO CAPS
300.0000 mg | ORAL_CAPSULE | Freq: Three times a day (TID) | ORAL | 0 refills | Status: DC
Start: 2023-12-19 — End: 2024-02-17

## 2023-12-19 MED ORDER — HYDROCODONE-ACETAMINOPHEN 5-325 MG PO TABS: 1.0000 | ORAL_TABLET | Freq: Four times a day (QID) | ORAL | 0 refills | Status: DC | PRN

## 2023-12-19 NOTE — Progress Notes (Signed)
 Subjective:  Patient ID: Zoe Soto, female    DOB: [birth year]  Age: 43 y.o. MRN: [id number]  CC:  Chief Complaint  Patient presents with   Sore Throat    Left side of throat was closed patient stated it is not as bad today but she is having a hard time eating,drinking,swallowing and talking. States she did have a virtual for ear infection and was prescribed azithromycin but it does not seem to helping. Did have a fever yesterday. Has been taking tylenol.     HPI Zoe Soto presents for   Sore Throat: Acute visit for above.  PCP is Dr. Mahlon. On chart review she was evaluated 2 days ago by e-visit.  Diagnosed with acute otitis media and started on azithromycin -Z-Pak Has taken 3 doses of azithromycin.   Temp 101 this morning - treated with tylenol.  Had sore throat initially with ear pain on the left with neck swelling on left.  Fevers started yesterday.101.8 this am is highest.  Pain with swallowing, but able to swallow fluids, pain with swallowing food, but able to swallow. Able to eat macaroni and cheese.  No known sick contacts, but was on a cruise 8/4-8/9.  Home covid test 2 days ago negative, but sxs present less than a day.  43yo dtr sick with vomiting illness - also had neg covid test.  No rash.  Throat felt more swollen yesterday than today.  Cough - greenish phlegm intermittently.      History Patient Active Problem List   Diagnosis Date Noted   Vaginal discharge 09/27/2020   Anxiety 09/26/2020   Depressive disorder 09/26/2020   Environmental and seasonal allergies 07/20/2020   Eustachian tube dysfunction 07/20/2020   Asthma    Heart palpitations 05/13/2019   Paroxysmal SVT (supraventricular tachycardia) (HCC) 05/13/2019   PAC (premature atrial contraction) 05/13/2019   PVC (premature ventricular contraction) 05/13/2019   H/O laparoscopy 06/27/2018   Anemia 06/04/2018   Herpes simplex virus type 2 (HSV-2) infection affecting pregnancy in third  trimester 02/21/2018   Intramural leiomyoma of uterus 05/26/2013   Irregular menstruation 08/04/2010   Past Medical History:  Diagnosis Date   Allergy    Seasonal   Asthma    Bee sting allergy    GERD (gastroesophageal reflux disease)    Herpes simplex virus (HSV) infection    History of chickenpox    Pneumonia 08/2017   RESOLVED   Past Surgical History:  Procedure Laterality Date   ABDOMINOPLASTY     ANKLE SURGERY Right 2011   DILITATION & CURRETTAGE/HYSTROSCOPY WITH NOVASURE ABLATION N/A 06/27/2018   Procedure: DILATATION & CURETTAGE/HYSTEROSCOPY WITH NOVASURE ABLATION;  Surgeon: Marne Kelly Nest, MD;  Location: [facility];  Service: Gynecology;  Laterality: N/A;   GASTRIC BYPASS  07/2015   GASTRIC BYPASS     KNEE ARTHROSCOPY Left 2010   LAPAROSCOPIC TUBAL LIGATION Bilateral 06/27/2018   Procedure: LAPAROSCOPIC TUBAL LIGATION;  Surgeon: Marne Kelly Nest, MD;  Location: [facility];  Service: Gynecology;  Laterality: Bilateral;   SMALL BOWEL REPAIR N/A 06/27/2018   Procedure: SMALL BOWEL REPAIR;  Surgeon: Marne Kelly Nest, MD;  Location: [facility];  Service: Gynecology;  Laterality: N/A;   Allergies  Allergen Reactions   Wasp Venom Anaphylaxis and Hives   Naproxen Other (See Comments)    Hematuria when taken in large doses   Beeswax    Other     Ants   Tape Rash    ADHESIVE AND  PAPER TAPE REMOVES SKIN   Prior to Admission medications   Medication Sig Start Date End Date Taking? Authorizing Provider  albuterol  (VENTOLIN  HFA) 108 (90 Base) MCG/ACT inhaler Inhale 2 puffs into the lungs every 6 (six) hours as needed for wheezing or shortness of breath. 07/18/22   Tabori, Katherine E, MD  azithromycin  (ZITHROMAX ) 250 MG tablet Take 2 tablets on day 1, then 1 tablet daily on days 2 through 5 12/17/23 12/22/23  Gladis Elsie BROCKS, PA-C  budesonide  (PULMICORT  [facility]) 180 MCG/ACT inhaler Inhale 2 puffs into the lungs  daily as needed. 04/05/21   Kip Ade, NP  clonazePAM  (KLONOPIN ) 0.5 MG disintegrating tablet Take 1-2 tablets (0.5-1 mg total) by mouth 3 (three) times daily as needed (anxiety). 04/19/23   Tabori, Katherine E, MD  EPINEPHrine  0.3 mg/0.3 mL IJ SOAJ injection Inject 0.3 mg into the muscle as needed for anaphylaxis. 11/15/23   Tabori, Katherine E, MD  FLOVENT  HFA 110 MCG/ACT inhaler Inhale 2 puffs into the lungs daily as needed. 07/23/22   Mahlon Comer BRAVO, MD  FLUoxetine  (PROZAC ) 20 MG capsule TAKE 1 CAPSULE BY MOUTH EVERY DAY 05/17/23   Tabori, Katherine E, MD  FLUTICASONE  PROPIONATE, NASAL, NA Place 1-2 sprays into the nose as needed (for allergies).    [provider]  HYDROcodone -acetaminophen  (NORCO/VICODIN) 5-325 MG tablet Take 1 tablet by mouth every 6 (six) hours as needed for severe pain. Patient not taking: Reported on 02/18/2023 10/01/22   Desiderio Chew, PA-C  methocarbamol  (ROBAXIN ) 500 MG tablet Take 1 tablet (500 mg total) by mouth 2 (two) times daily as needed. 12/02/23   Bernis Ernst, PA-C  valACYclovir  (VALTREX ) 500 MG tablet Take 1 tablet (500 mg total) by mouth 2 (two) times daily. Patient not taking: Reported on 02/18/2023 08/22/22   Gladis Elsie BROCKS, PA-C   Social History   Socioeconomic History   Marital status: Widowed    Spouse name: Not on file   Number of children: 2   Years of education: 16   Highest education level: Bachelor's degree (e.g., BA, AB, BS)  Occupational History   Occupation: Administration  Tobacco Use   Smoking status: Former   Smokeless tobacco: Never  Vaping Use   Vaping status: Never Used  Substance and Sexual Activity   Alcohol use: Yes    Alcohol/week: 4.0 standard drinks of alcohol    Types: 4 Glasses of wine per week    Comment: 4 glasses of wine per week    Drug use: No   Sexual activity: Not Currently    Birth control/protection: None  Other Topics Concern   Not on file  Social History Narrative   Not on file    Social Drivers of Health   Financial Resource Strain: Low Risk  (12/17/2023)   Overall Financial Resource Strain (CARDIA)    Difficulty of Paying Living Expenses: Not hard at all  Food Insecurity: No Food Insecurity (12/17/2023)   Hunger Vital Sign    Worried About Running Out of Food in the Last Year: Never true    Ran Out of Food in the Last Year: Never true  Transportation Needs: No Transportation Needs (12/17/2023)   PRAPARE - Administrator, Civil Service (Medical): No    Lack of Transportation (Non-Medical): No  Physical Activity: Sufficiently Active (12/17/2023)   Exercise Vital Sign    Days of Exercise per Week: 5 days    Minutes of Exercise per Session: 130 min  Stress: Stress Concern Present (12/17/2023)  [employer] of Occupational Health - Occupational Stress Questionnaire    Feeling of Stress: To some extent  Social Connections: Moderately Isolated (12/17/2023)   Social Connection and Isolation Panel    Frequency of Communication with Friends and Family: More than three times a week    Frequency of Social Gatherings with Friends and Family: Once a week    Attends Religious Services: More than 4 times per year    Active Member of [employer] or Organizations: No    Attends Banker Meetings: Not on file    Marital Status: Widowed  Intimate Partner Violence: Not on file    Review of Systems  Per HPI.   Objective:   Vitals:   12/19/23 1059  BP: 120/84  Pulse: 81  Temp: 98 F (36.7 C)  TempSrc: Temporal  SpO2: 98%  Weight: 220 lb (99.8 kg)     Physical Exam Vitals reviewed.  Constitutional:      General: She is not in acute distress.    Appearance: She is well-developed.  HENT:     Head: Normocephalic and atraumatic.     Right Ear: Hearing, tympanic membrane, ear canal and external ear normal.     Left Ear: Hearing, tympanic membrane, ear canal and external ear normal.     Ears:     Comments: No erythema/injection of TMs.   Possible small amount of clear fluid behind base of left TM.  Canals clear.    Nose: Nose normal.     Mouth/Throat:     Pharynx: Oropharyngeal exudate present. No posterior oropharyngeal erythema.     Comments: See photo, white exudate adherent to left and right tonsil recesses without apparent abscess or significant asymmetry.  Uvula midline.  Clearing secretions as above. Eyes:     Conjunctiva/sclera: Conjunctivae normal.     Pupils: Pupils are equal, round, and reactive to light.  Cardiovascular:     Rate and Rhythm: Normal rate and regular rhythm.     Heart sounds: Normal heart sounds. No murmur heard. Pulmonary:     Effort: Pulmonary effort is normal. No respiratory distress.     Breath sounds: Normal breath sounds. No stridor. No wheezing or rhonchi.  Lymphadenopathy:     Cervical: Cervical adenopathy (Tender with slight lymphadenopathy AC nodes left greater than right.  No appreciable asymmetry of neck or focal swelling.  No stridor, clearing secretions and speaking normally.) present.  Skin:    General: Skin is warm and dry.     Findings: No rash.  Neurological:     Mental Status: She is alert and oriented to person, place, and time.  Psychiatric:        Mood and Affect: Mood normal.        Behavior: Behavior normal.     Results for orders placed or performed in visit on 12/19/23  POCT rapid strep A   Collection Time: 12/19/23 11:36 AM  Result Value Ref Range   Rapid Strep A Screen Negative Negative     Assessment & Plan:  Zoe Soto is a 43 y.o. female . Sore throat - Plan: POCT rapid strep A, Culture, Group A Strep, Epstein-Barr virus VCA, IgM, clindamycin  (CLEOCIN ) 300 MG capsule, HYDROcodone -acetaminophen  (NORCO/VICODIN) 5-325 MG tablet, CBC, CANCELED: CBC  Exudative tonsillitis - Plan: Culture, Group A Strep, Epstein-Barr virus VCA, IgM, clindamycin  (CLEOCIN ) 300 MG capsule, HYDROcodone -acetaminophen  (NORCO/VICODIN) 5-325 MG tablet, CBC, CANCELED: CBC Exudative  tonsillitis with negative rapid strep testing in office.  Differential includes false negative strep  testing versus EBV versus CMV or other viral infection.  No sign of true peritonsillar abscess at this time but symptoms and signs were discussed with the ER precautions.  Will check CBC, EBV IgM, throat culture as above.  Change azithromycin  to clindamycin  for improved coverage and coverage for possible early PTA if that were occurring.  Potential side effects and risks of antibiotic discussed.  Symptomatic care discussed, short-term hydrocodone  provided for pain relief.  24-hour follow-up with ER precautions given.  Meds ordered this encounter  Medications   clindamycin  (CLEOCIN ) 300 MG capsule    Sig: Take 1 capsule (300 mg total) by mouth 3 (three) times daily.    Dispense:  30 capsule    Refill:  0   HYDROcodone -acetaminophen  (NORCO/VICODIN) 5-325 MG tablet    Sig: Take 1 tablet by mouth every 6 (six) hours as needed for moderate pain (pain score 4-6).    Dispense:  15 tablet    Refill:  0   Patient Instructions  Stop azithromycin  and start new antibiotic today - this has better coverage for strep and will try to lessen change of developing an abscess.  If you notice increased swelling from the tonsil, including pushing towards the uvula or beyond the middle, be seen in the emergency room but I do not expect this to happen.  Hydrocodone  if needed for more severe pain but try Tylenol  or ibuprofen intermittently first.  Make sure to continue to drink fluids, be seen if any new or worsening symptoms.  If any concerns on labs I will let you know. Will recheck tomorrow.   Return to the clinic or go to the nearest emergency room if any of your symptoms worsen or new symptoms occur.  Tonsillitis  Tonsillitis is an infection of the throat that causes the tonsils to become red, tender, and swollen. Tonsils are tissues in the back of your throat. Each tonsil has crevices (crypts). Tonsils normally work  to protect the body from infection. What are the causes? Sudden (acute) tonsillitis may be caused by a virus or bacteria, including streptococcal bacteria. Long-lasting (chronic) tonsillitis occurs when the crypts of the tonsils become filled with pieces of food and bacteria, which makes it easy for the tonsils to become repeatedly infected. Tonsillitis can be spread from person to person when it is caused by a virus or bacteria. It may be spread by inhaling droplets that are released with coughing or sneezing. You may also come into contact with viruses or bacteria on surfaces, such as cups or utensils. What are the signs or symptoms? Symptoms of this condition include: A sore throat. This may include trouble swallowing. White patches on the tonsils. Swollen tonsils. Fever. Headache. Tiredness. Loss of appetite. Snoring during sleep when you did not snore before. Small, foul-smelling, yellowish-white pieces of material (tonsilloliths) that you occasionally cough up or spit out. These can cause you to have bad breath. How is this diagnosed? This condition is diagnosed with a physical exam. Diagnosis can be confirmed with the results of lab tests, including a throat culture. How is this treated? Treatment for this condition depends on the cause, but usually focuses on treating the symptoms associated with it. Treatment may include: Medicines to relieve pain and manage fever. Steroid medicines to reduce swelling. Antibiotic medicines if the condition is caused by bacteria. If episodes of tonsillitis are severe and frequent, your health care provider may recommend surgery to remove the tonsils (tonsillectomy). Follow these instructions at home: Medicines Take over-the-counter and  prescription medicines only as told by your health care provider. If you were prescribed an antibiotic medicine, take it as told by your health care provider. Do not stop taking the antibiotic even if you start to  feel better. Eating and drinking Drink enough fluid to keep your urine pale yellow. While your throat is sore, eat soft or liquid foods, such as sherbet, soups, or soft, warm cereals, such as oatmeal or hot wheat cereal. Drink warm liquids. Eat frozen ice pops. General instructions Rest as much as possible and get plenty of sleep. Gargle with a mixture of salt and water 3-4 times a day or as needed. To make salt water, completely dissolve -1 tsp (3-6 g) of salt in 1 cup (237 mL) of warm water. Do not swallow the mixture of salt and water. Wash your hands regularly with soap and water for at least 20 seconds. If soap and water are not available, use hand sanitizer. Do not share cups, bottles, or other utensils until your symptoms have gone away. Do not use any products that contain nicotine or tobacco. These products include cigarettes, chewing tobacco, and vaping devices, such as e-cigarettes. If you need help quitting, ask your health care provider. Keep all follow-up visits. This is important. Contact a health care provider if: You notice large, tender lumps in your neck that were not there before. You have a fever that does not go away after 2-3 days. You develop a rash. You cough up a green, yellow-brown, or bloody substance. You cannot swallow liquids or food for 24 hours. Only one of your tonsils is swollen. Get help right away if: You develop any new symptoms, such as vomiting, severe headache, stiff neck, chest pain, trouble breathing, or trouble swallowing. You have severe throat pain along with drooling or voice changes. You have severe pain that is not controlled with medicines. You cannot fully open your mouth. You develop redness, swelling, or severe pain anywhere in your neck. Summary Tonsillitis is an infection of the throat that causes the tonsils to become red, tender, and swollen. The most common symptom is pain in the throat. Tonsillitis is most often caused by a  virus or bacteria. Get help right away if you develop any new symptoms, such as vomiting, severe headache, stiff neck, chest pain, or trouble breathing. This information is not intended to replace advice given to you by your health care provider. Make sure you discuss any questions you have with your health care provider. Document Revised: 09/14/2020 Document Reviewed: 09/15/2020 Elsevier Patient Education  2024 Elsevier Inc.      Signed,   Reyes Pines, MD [facility], [facility] 12/19/23 9:50 PM

## 2023-12-19 NOTE — Patient Instructions (Addendum)
 Stop azithromycin  and start new antibiotic today - this has better coverage for strep and will try to lessen change of developing an abscess.  If you notice increased swelling from the tonsil, including pushing towards the uvula or beyond the middle, be seen in the emergency room but I do not expect this to happen.  Hydrocodone  if needed for more severe pain but try Tylenol  or ibuprofen intermittently first.  Make sure to continue to drink fluids, be seen if any new or worsening symptoms.  If any concerns on labs I will let you know. Will recheck tomorrow.   Return to the clinic or go to the nearest emergency room if any of your symptoms worsen or new symptoms occur.  Tonsillitis  Tonsillitis is an infection of the throat that causes the tonsils to become red, tender, and swollen. Tonsils are tissues in the back of your throat. Each tonsil has crevices (crypts). Tonsils normally work to protect the body from infection. What are the causes? Sudden (acute) tonsillitis may be caused by a virus or bacteria, including streptococcal bacteria. Long-lasting (chronic) tonsillitis occurs when the crypts of the tonsils become filled with pieces of food and bacteria, which makes it easy for the tonsils to become repeatedly infected. Tonsillitis can be spread from person to person when it is caused by a virus or bacteria. It may be spread by inhaling droplets that are released with coughing or sneezing. You may also come into contact with viruses or bacteria on surfaces, such as cups or utensils. What are the signs or symptoms? Symptoms of this condition include: A sore throat. This may include trouble swallowing. White patches on the tonsils. Swollen tonsils. Fever. Headache. Tiredness. Loss of appetite. Snoring during sleep when you did not snore before. Small, foul-smelling, yellowish-white pieces of material (tonsilloliths) that you occasionally cough up or spit out. These can cause you to have bad  breath. How is this diagnosed? This condition is diagnosed with a physical exam. Diagnosis can be confirmed with the results of lab tests, including a throat culture. How is this treated? Treatment for this condition depends on the cause, but usually focuses on treating the symptoms associated with it. Treatment may include: Medicines to relieve pain and manage fever. Steroid medicines to reduce swelling. Antibiotic medicines if the condition is caused by bacteria. If episodes of tonsillitis are severe and frequent, your health care provider may recommend surgery to remove the tonsils (tonsillectomy). Follow these instructions at home: Medicines Take over-the-counter and prescription medicines only as told by your health care provider. If you were prescribed an antibiotic medicine, take it as told by your health care provider. Do not stop taking the antibiotic even if you start to feel better. Eating and drinking Drink enough fluid to keep your urine pale yellow. While your throat is sore, eat soft or liquid foods, such as sherbet, soups, or soft, warm cereals, such as oatmeal or hot wheat cereal. Drink warm liquids. Eat frozen ice pops. General instructions Rest as much as possible and get plenty of sleep. Gargle with a mixture of salt and water 3-4 times a day or as needed. To make salt water, completely dissolve -1 tsp (3-6 g) of salt in 1 cup (237 mL) of warm water. Do not swallow the mixture of salt and water. Wash your hands regularly with soap and water for at least 20 seconds. If soap and water are not available, use hand sanitizer. Do not share cups, bottles, or other utensils until your  symptoms have gone away. Do not use any products that contain nicotine or tobacco. These products include cigarettes, chewing tobacco, and vaping devices, such as e-cigarettes. If you need help quitting, ask your health care provider. Keep all follow-up visits. This is important. Contact a health  care provider if: You notice large, tender lumps in your neck that were not there before. You have a fever that does not go away after 2-3 days. You develop a rash. You cough up a green, yellow-brown, or bloody substance. You cannot swallow liquids or food for 24 hours. Only one of your tonsils is swollen. Get help right away if: You develop any new symptoms, such as vomiting, severe headache, stiff neck, chest pain, trouble breathing, or trouble swallowing. You have severe throat pain along with drooling or voice changes. You have severe pain that is not controlled with medicines. You cannot fully open your mouth. You develop redness, swelling, or severe pain anywhere in your neck. Summary Tonsillitis is an infection of the throat that causes the tonsils to become red, tender, and swollen. The most common symptom is pain in the throat. Tonsillitis is most often caused by a virus or bacteria. Get help right away if you develop any new symptoms, such as vomiting, severe headache, stiff neck, chest pain, or trouble breathing. This information is not intended to replace advice given to you by your health care provider. Make sure you discuss any questions you have with your health care provider. Document Revised: 09/14/2020 Document Reviewed: 09/15/2020 Elsevier Patient Education  2024 [employer].

## 2023-12-20 ENCOUNTER — Ambulatory Visit (INDEPENDENT_AMBULATORY_CARE_PROVIDER_SITE_OTHER): Admitting: Family Medicine

## 2023-12-20 ENCOUNTER — Encounter: Payer: Self-pay | Admitting: Family Medicine

## 2023-12-20 ENCOUNTER — Telehealth: Payer: Self-pay

## 2023-12-20 VITALS — BP 120/88 | HR 86 | Temp 98.0°F | Wt 220.0 lb

## 2023-12-20 DIAGNOSIS — J039 Acute tonsillitis, unspecified: Secondary | ICD-10-CM

## 2023-12-20 MED ORDER — FLUCONAZOLE 150 MG PO TABS: 150.0000 mg | ORAL_TABLET | Freq: Once | ORAL | 0 refills | Status: AC

## 2023-12-20 NOTE — Patient Instructions (Signed)
 Thank you for coming today.  The tonsillitis appears stable.  I will keep an eye on your labs but I do not recommend any change in medicines at this time.  Continue to drink plenty of fluids, antipyretics like Tylenol  as needed, hydrocodone  if needed for more severe pain.  If any increasing pain, increasing swelling especially one-sided swelling or more difficulty swallowing be seen in the ER but I do not expect that to occur.  I expect that you will continue to improve, let me know if there are questions and hang in there.

## 2023-12-20 NOTE — Telephone Encounter (Signed)
 Patient was prescribed antibitoics at recent appointment 8/14-8/15 and is requesting diflucan  prescription due to patient states she tends to get yeast infections when taking antibiotics. See message below.

## 2023-12-20 NOTE — Telephone Encounter (Signed)
 Copied from CRM #[id number]. Topic: Clinical - Medication Question >> Dec 20, 2023  1:38 PM Burnard DEL wrote: Reason for CRM: Patient was prescribed an antibiotic  and would like to know if she could be prescribed 2 diflucan  tablets one for her to take now and one after antibiotics for yeast infection that she usually tends to get?  [id number] - [place], [place] - [address]  Phone: [phone] Fax: [phone]

## 2023-12-20 NOTE — Telephone Encounter (Signed)
 That is reasonable.  I sent prescription.

## 2023-12-20 NOTE — Progress Notes (Signed)
 Subjective:  Patient ID: Zoe Soto, female    DOB: Jan 12, 1981  Age: 43 y.o. MRN: [id number]  CC:  Chief Complaint  Patient presents with   Sore Throat    Follow up from yesterdays visit     HPI Zoe Soto presents for   Sore throat, tonsillitis Follow-up from office visit yesterday.  Had initially been treated with e-visit for possible otitis media, started on azithromycin  few days prior.  Worsening sore throat, exudative tonsillitis noted on exam yesterday without apparent signs of peritonsillar abscess.  Rapid strep test was negative but did treat with clindamycin  for possible false negative strep testing, strep throat culture ordered as well as EBV IgM, CBC.  ER precautions given overnight if signs or symptoms of PTA.  Hydrocodone  if needed for pain.  Labs still pending.  Since visit yesterday, feeling better. No pain meds needed since 4 am.  Able to swallow fluids, less sore to swallow.  No side effects with clindamycin .  No fever since yesterday. Last meds at 4am and no fever since. Slight HA. No change in appearance  History Patient Active Problem List   Diagnosis Date Noted   Vaginal discharge 09/27/2020   Anxiety 09/26/2020   Depressive disorder 09/26/2020   Environmental and seasonal allergies 07/20/2020   Eustachian tube dysfunction 07/20/2020   Asthma    Heart palpitations 05/13/2019   Paroxysmal SVT (supraventricular tachycardia) (HCC) 05/13/2019   PAC (premature atrial contraction) 05/13/2019   PVC (premature ventricular contraction) 05/13/2019   H/O laparoscopy 06/27/2018   Anemia 06/04/2018   Herpes simplex virus type 2 (HSV-2) infection affecting pregnancy in third trimester 02/21/2018   Intramural leiomyoma of uterus 05/26/2013   Irregular menstruation 08/04/2010   Past Medical History:  Diagnosis Date   Allergy    Seasonal   Asthma    Bee sting allergy    GERD (gastroesophageal reflux disease)    Herpes simplex virus (HSV) infection     History of chickenpox    Pneumonia 08/2017   RESOLVED   Past Surgical History:  Procedure Laterality Date   ABDOMINOPLASTY     ANKLE SURGERY Right 2011   DILITATION & CURRETTAGE/HYSTROSCOPY WITH NOVASURE ABLATION N/A 06/27/2018   Procedure: DILATATION & CURETTAGE/HYSTEROSCOPY WITH NOVASURE ABLATION;  Surgeon: Marne Kelly Nest, MD;  Location: [facility] [place];  Service: Gynecology;  Laterality: N/A;   GASTRIC BYPASS  07/2015   GASTRIC BYPASS     KNEE ARTHROSCOPY Left 2010   LAPAROSCOPIC TUBAL LIGATION Bilateral 06/27/2018   Procedure: LAPAROSCOPIC TUBAL LIGATION;  Surgeon: Marne Kelly Nest, MD;  Location: [facility];  Service: Gynecology;  Laterality: Bilateral;   SMALL BOWEL REPAIR N/A 06/27/2018   Procedure: SMALL BOWEL REPAIR;  Surgeon: Marne Kelly Nest, MD;  Location: [facility];  Service: Gynecology;  Laterality: N/A;   Allergies  Allergen Reactions   Wasp Venom Anaphylaxis and Hives   Naproxen Other (See Comments)    Hematuria when taken in large doses   Beeswax    Other     Ants   Tape Rash    ADHESIVE AND PAPER TAPE REMOVES SKIN   Prior to Admission medications   Medication Sig Start Date End Date Taking? Authorizing Provider  albuterol  (VENTOLIN  HFA) 108 (90 Base) MCG/ACT inhaler Inhale 2 puffs into the lungs every 6 (six) hours as needed for wheezing or shortness of breath. 07/18/22   Tabori, Katherine E, MD  azithromycin  (ZITHROMAX ) 250 MG tablet Take 2 tablets on day 1, then  1 tablet daily on days 2 through 5 12/17/23 12/22/23  Gladis Elsie BROCKS, PA-C  budesonide  (PULMICORT  [facility]) 180 MCG/ACT inhaler Inhale 2 puffs into the lungs daily as needed. 04/05/21   Kip Ade, NP  clindamycin  (CLEOCIN ) 300 MG capsule Take 1 capsule (300 mg total) by mouth 3 (three) times daily. 12/19/23   Zoe Reyes SAUNDERS, MD  clonazePAM  (KLONOPIN ) 0.5 MG disintegrating tablet Take 1-2 tablets (0.5-1 mg total) by mouth 3 (three)  times daily as needed (anxiety). 04/19/23   Tabori, Katherine E, MD  EPINEPHrine  0.3 mg/0.3 mL IJ SOAJ injection Inject 0.3 mg into the muscle as needed for anaphylaxis. 11/15/23   Tabori, Katherine E, MD  FLOVENT  HFA 110 MCG/ACT inhaler Inhale 2 puffs into the lungs daily as needed. 07/23/22   Mahlon Comer BRAVO, MD  FLUoxetine  (PROZAC ) 20 MG capsule TAKE 1 CAPSULE BY MOUTH EVERY DAY 05/17/23   Tabori, Katherine E, MD  FLUTICASONE  PROPIONATE, NASAL, NA Place 1-2 sprays into the nose as needed (for allergies).    [provider]  HYDROcodone -acetaminophen  (NORCO/VICODIN) 5-325 MG tablet Take 1 tablet by mouth every 6 (six) hours as needed for moderate pain (pain score 4-6). 12/19/23   Zoe Reyes SAUNDERS, MD   Social History   Socioeconomic History   Marital status: Widowed    Spouse name: Not on file   Number of children: 2   Years of education: 16   Highest education level: Bachelor's degree (e.g., BA, AB, BS)  Occupational History   Occupation: Administration  Tobacco Use   Smoking status: Former   Smokeless tobacco: Never  Vaping Use   Vaping status: Never Used  Substance and Sexual Activity   Alcohol use: Yes    Alcohol/week: 4.0 standard drinks of alcohol    Types: 4 Glasses of wine per week    Comment: 4 glasses of wine per week    Drug use: No   Sexual activity: Not Currently    Birth control/protection: None  Other Topics Concern   Not on file  Social History Narrative   Not on file   Social Drivers of Health   Financial Resource Strain: Low Risk  (12/17/2023)   Overall Financial Resource Strain (CARDIA)    Difficulty of Paying Living Expenses: Not hard at all  Food Insecurity: No Food Insecurity (12/17/2023)   Hunger Vital Sign    Worried About Running Out of Food in the Last Year: Never true    Ran Out of Food in the Last Year: Never true  Transportation Needs: No Transportation Needs (12/17/2023)   PRAPARE - Administrator, Civil Service  (Medical): No    Lack of Transportation (Non-Medical): No  Physical Activity: Sufficiently Active (12/17/2023)   Exercise Vital Sign    Days of Exercise per Week: 5 days    Minutes of Exercise per Session: 130 min  Stress: Stress Concern Present (12/17/2023)   [employer] of Occupational Health - Occupational Stress Questionnaire    Feeling of Stress: To some extent  Social Connections: Moderately Isolated (12/17/2023)   Social Connection and Isolation Panel    Frequency of Communication with Friends and Family: More than three times a week    Frequency of Social Gatherings with Friends and Family: Once a week    Attends Religious Services: More than 4 times per year    Active Member of [employer] or Organizations: No    Attends Banker Meetings: Not on file    Marital Status: Widowed  Intimate Partner Violence: Not on file    Review of Systems   Objective:   Vitals:   12/20/23 1106  BP: 120/88  Pulse: 86  Temp: 98 F (36.7 C)  TempSrc: Temporal  SpO2: 98%  Weight: 220 lb (99.8 kg)     Physical Exam Vitals reviewed.  Constitutional:      General: She is not in acute distress.    Appearance: She is well-developed. She is not toxic-appearing or diaphoretic.  HENT:     Head: Normocephalic and atraumatic.     Right Ear: Hearing, tympanic membrane, ear canal and external ear normal.     Left Ear: Hearing, tympanic membrane, ear canal and external ear normal.     Nose: Nose normal.     Mouth/Throat:     Pharynx: No posterior oropharyngeal erythema.     Tonsils: Tonsillar exudate present. No tonsillar abscesses.     Comments: White exudate on bilateral tonsils, uvula midline, no apparent abscess.  No significant asymmetry of right versus left tonsils.  Clearing secretions, no stridor, nontoxic appearance. Eyes:     Conjunctiva/sclera: Conjunctivae normal.     Pupils: Pupils are equal, round, and reactive to light.  Cardiovascular:     Rate and Rhythm: Normal  rate and regular rhythm.     Heart sounds: Normal heart sounds. No murmur heard. Pulmonary:     Effort: Pulmonary effort is normal. No respiratory distress.     Breath sounds: Normal breath sounds. No stridor. No wheezing or rhonchi.  Skin:    General: Skin is warm and dry.     Findings: No rash.  Neurological:     Mental Status: She is alert and oriented to person, place, and time.  Psychiatric:        Mood and Affect: Mood normal.        Behavior: Behavior normal.      Assessment & Plan:  Zoe Soto is a 43 y.o. female . Exudative tonsillitis Appears to be stable, with slight symptomatic improvement since yesterday, afebrile, pain has improved without need for meds since 4 AM.  No apparent peritonsillar abscess on exam again today.  Tolerating antibiotics.  Will continue clindamycin  same dose for now with continued ER precautions for signs or symptoms of peritonsillar abscess.  Continue symptomatic care, has hydrocodone  if needed, contagious precautions discussed with RTC precautions given if not improving into next week.  No orders of the defined types were placed in this encounter.  There are no Patient Instructions on file for this visit.    Signed,   Reyes Pines, MD [facility], [facility] 12/20/23 12:20 PM

## 2023-12-22 ENCOUNTER — Ambulatory Visit: Payer: Self-pay | Admitting: Family Medicine

## 2023-12-27 LAB — CULTURE, GROUP A STREP
Micro Number: 16832200
SPECIMEN QUALITY:: ADEQUATE

## 2023-12-27 LAB — EXTRA SPECIMEN

## 2023-12-27 LAB — EPSTEIN-BARR VIRUS VCA, IGM

## 2024-01-10 ENCOUNTER — Other Ambulatory Visit: Payer: Self-pay | Admitting: Family Medicine

## 2024-01-10 DIAGNOSIS — F4321 Adjustment disorder with depressed mood: Secondary | ICD-10-CM

## 2024-01-28 ENCOUNTER — Ambulatory Visit (INDEPENDENT_AMBULATORY_CARE_PROVIDER_SITE_OTHER): Admitting: Family Medicine

## 2024-01-28 ENCOUNTER — Ambulatory Visit: Payer: Self-pay | Admitting: *Deleted

## 2024-01-28 ENCOUNTER — Ambulatory Visit (HOSPITAL_BASED_OUTPATIENT_CLINIC_OR_DEPARTMENT_OTHER)
Admission: RE | Admit: 2024-01-28 | Discharge: 2024-01-28 | Disposition: A | Discharge status: Home / Self Care | Source: Ambulatory Visit | Attending: Family Medicine | Admitting: Family Medicine

## 2024-01-28 ENCOUNTER — Encounter: Payer: Self-pay | Admitting: Family Medicine

## 2024-01-28 VITALS — BP 128/82 | HR 78 | Temp 98.3°F | Ht 65.0 in | Wt 222.4 lb

## 2024-01-28 DIAGNOSIS — R52 Pain, unspecified: Secondary | ICD-10-CM | POA: Diagnosis not present

## 2024-01-28 DIAGNOSIS — R509 Fever, unspecified: Secondary | ICD-10-CM

## 2024-01-28 DIAGNOSIS — R5383 Other fatigue: Secondary | ICD-10-CM | POA: Diagnosis not present

## 2024-01-28 LAB — HEPATIC FUNCTION PANEL
ALT: 8 U/L (ref 0–35)
AST: 13 U/L (ref 0–37)
Albumin: 3.6 g/dL (ref 3.5–5.2)
Alkaline Phosphatase: 65 U/L (ref 39–117)
Bilirubin, Direct: 0.1 mg/dL (ref 0.0–0.3)
Total Bilirubin: 0.4 mg/dL (ref 0.2–1.2)
Total Protein: 7.3 g/dL (ref 6.0–8.3)

## 2024-01-28 LAB — BASIC METABOLIC PANEL WITH GFR
BUN: 11 mg/dL (ref 6–23)
CO2: 27 meq/L (ref 19–32)
Calcium: 8.9 mg/dL (ref 8.4–10.5)
Chloride: 104 meq/L (ref 96–112)
Creatinine, Ser: 0.94 mg/dL (ref 0.40–1.20)
GFR: 73.49 mL/min (ref >60.00)
Glucose, Bld: 81 mg/dL (ref 70–99)
Potassium: 3.9 meq/L (ref 3.5–5.1)
Sodium: 139 meq/L (ref 135–145)

## 2024-01-28 LAB — CBC WITH DIFFERENTIAL/PLATELET
Basophils Absolute: 0.1 K/uL (ref 0.0–0.1)
Basophils Relative: 1.2 % (ref 0.0–3.0)
Eosinophils Absolute: 0.1 K/uL (ref 0.0–0.7)
Eosinophils Relative: 1.7 % (ref 0.0–5.0)
HCT: 29.8 % — ABNORMAL LOW (ref 36.0–46.0)
Hemoglobin: 9.4 g/dL — ABNORMAL LOW (ref 12.0–15.0)
Lymphocytes Relative: 32.5 % (ref 12.0–46.0)
Lymphs Abs: 1.5 K/uL (ref 0.7–4.0)
MCHC: 31.4 g/dL (ref 30.0–36.0)
MCV: 68 fl — ABNORMAL LOW (ref 78.0–100.0)
Monocytes Absolute: 0.2 K/uL (ref 0.1–1.0)
Monocytes Relative: 3.9 % (ref 3.0–12.0)
Neutro Abs: 2.9 K/uL (ref 1.4–7.7)
Neutrophils Relative %: 60.7 % (ref 43.0–77.0)
Platelets: 602 K/uL — ABNORMAL HIGH (ref 150.0–400.0)
RBC: 4.39 Mil/uL (ref 3.87–5.11)
RDW: 20.9 % — ABNORMAL HIGH (ref 11.5–15.5)
WBC: 4.7 K/uL (ref 4.0–10.5)

## 2024-01-28 LAB — B12 AND FOLATE PANEL
Folate: 7.1 ng/mL (ref >5.9)
Vitamin B-12: 168 pg/mL — ABNORMAL LOW (ref 211–911)

## 2024-01-28 LAB — SEDIMENTATION RATE: Sed Rate: 74 mm/h — ABNORMAL HIGH (ref 0–20)

## 2024-01-28 LAB — VITAMIN D 25 HYDROXY (VIT D DEFICIENCY, FRACTURES): VITD: <7 ng/mL — ABNORMAL LOW (ref 30.00–100.00)

## 2024-01-28 LAB — TSH: TSH: 1.84 u[IU]/mL (ref 0.35–5.50)

## 2024-01-28 MED ORDER — PREGABALIN 50 MG PO CAPS: 50.0000 mg | ORAL_CAPSULE | Freq: Two times a day (BID) | ORAL | 1 refills | Status: DC

## 2024-01-28 NOTE — Progress Notes (Signed)
   Subjective:    Patient ID: Zoe Soto, female    DOB: [birth year], 43 y.o.   MRN: [id number]  HPI Body aches- went to ER at end of July after BP spiked.  Had abnormal CBC at that time.  Did not f/u.    Went on a cruise and returned feeling very badly.  Was tx'd as strep.  Pt reports frequently being cold- 'all the time'.  Pt reports body aches are 'all over and all the time'.  Body aches worsen when she is cold.  If she tries to take a shower to warm up, the shower is painful.  Pt reports pain will start across traps, spread to her chest, and hips.  No relief w/ Tylenol .  Some relief w/ Ibuprofen.  Pt reports her baseline temp is around 97.  When temp goes up to 12 'i can feel it'  pt reports hair will hurt- temp yesterday was 101.5   Review of Systems For ROS see HPI     Objective:   Physical Exam Vitals reviewed.  Constitutional:      General: She is not in acute distress.    Appearance: Normal appearance. She is not ill-appearing.  HENT:     Head: Normocephalic and atraumatic.  Eyes:     Extraocular Movements: Extraocular movements intact.     Conjunctiva/sclera: Conjunctivae normal.  Cardiovascular:     Rate and Rhythm: Normal rate and regular rhythm.     Pulses: Normal pulses.     Heart sounds: Normal heart sounds.  Pulmonary:     Effort: Pulmonary effort is normal. No respiratory distress.     Breath sounds: Normal breath sounds. No wheezing or rhonchi.  Abdominal:     General: There is no distension.     Palpations: Abdomen is soft.     Tenderness: There is no abdominal tenderness. There is no guarding or rebound.  Musculoskeletal:        General: Tenderness (TTP over multiple trigger points- trapezius, anterior chest wall, thighs, calves bilaterally) present.     Cervical back: Neck supple.  Lymphadenopathy:     Cervical: No cervical adenopathy.  Skin:    General: Skin is warm and dry.  Neurological:     General: No focal deficit present.     Mental Status: She  is alert and oriented to person, place, and time.  Psychiatric:     Comments: Tearful, anxious           Assessment & Plan:  Body aches/fever/fatigue- new.  Pt reports this has been going on x2 months.  No evidence of bacterial infxn on exam so no abx needed.  Pt's pain, anxiety, and poor sleep may be fibromyalgia- but since this is a dx of exclusion will check labs to r/o other causes.  Since the fever is unexplained, will get CXR.  Also check for possible auto immune conditions w/ ANA.  Start Lyrica  for pain relief.  Will determine next steps based on lab results.  Pt expressed understanding and is in agreement w/ plan.

## 2024-01-28 NOTE — Telephone Encounter (Signed)
 Patient is seeing you today to address URI concerns

## 2024-01-28 NOTE — Telephone Encounter (Signed)
 Copied from CRM [id number]. Topic: Clinical - Red Word Triage >> Jan 28, 2024  8:44 AM Zoe Soto wrote: [facility] that prompted transfer to Nurse Triage: body aches and pain  elevated temperature, body aches, body pain, for 2 months, she went to the hospital the end of July  never did a follow up. pain does not stop she says even her hair hurt. Reason for Disposition  [1] Fever comes and goes (intermittent) AND [2] lasts > 3 weeks  Answer Assessment - Initial Assessment Questions 1. TEMPERATURE: What is the most recent temperature?  How was it measured?      Elevated temperature for the last couple of months intermittently.   When I went to ED 12/02/2023.  I was anemic.  My iron  is always low.   I'm always cold.   I went to ED my BP spiked all of a sudden.   They got it down and sent me home.   I was supposed to do a follow up with Dr. Mahlon and find out what was wrong.   My temperature is normally 97.   Lately it's 99 or 99.5.  I feel the 99 because my normal temperature is 97 I got sick after being on a cruise in Aug.   They thought it was strep but it wasn't.   Could not figure out what was wrong.    I'm still getting the fever.   Last night 101.5.    Every day my body is in pain.   I hurt all over and I'm cold.  I have a heater and a blanket in my office.  No one else is cold in my office.   My hair even hurts. I had a headache.   This morning I'm hurting in my shoulder blades and hips.    2. ONSET: When did the fever start?      A couple of months now.    By 2:00-4:00 I'm in so much pain.   3. CHILLS: Do you have chills? If yes: How bad are they?  (e.g., none, mild, moderate, severe)     Yes bad chills and cold 4. OTHER SYMPTOMS: Do you have any other symptoms besides the fever?  (e.g., abdomen pain, cough, diarrhea, earache, headache, sore throat, urination pain)     See above 5. CAUSE: If there are no symptoms, ask: What do you think is causing the fever?      I have no  idea. Sometimes it's bone deep.   6. CONTACTS: Does anyone else in the family have an infection?     Not asked 7. TREATMENT: What have you done so far to treat this fever? (e.g., OTC fever medicines)     Went ED and they gave me muscle relaxers which help a little with the headache.   One of my blood counts is off.   Don't remember which one. 8. IMMUNOCOMPROMISE: Do you have any of the following: diabetes, HIV positive, splenectomy, cancer chemotherapy, chronic steroid treatment, transplant patient, etc.?     Not that I know of. 9. PREGNANCY: Is there any chance you are pregnant? When was your last menstrual period?     N/A 10. TRAVEL: Have you traveled out of the country in the last month? (e.g., travel history, exposures)       N/A  Protocols used: Fever-A-AH FYI Only or Action Required?: FYI only for provider.  Patient was last seen in primary care on 12/20/2023 by Zoe Reyes SAUNDERS, MD.  Called Nurse Triage reporting Fever.for the last 2 months, headaches body aches, chills.  Symptoms began several months ago.  Interventions attempted: OTC medications: Advil PM and Ice/heat application.  Symptoms are: gradually worsening.  Triage Disposition: See PCP When Office is Open (Within 3 Days) Appt made for today with Dr. Mahlon  Patient/caregiver understands and will follow disposition?: Yes

## 2024-01-28 NOTE — Patient Instructions (Signed)
 Follow up in 4 weeks to recheck pain We'll notify you of your lab results and make any changes if needed GO to [facility] and get your chest xray done- go in the main entrance, take the elevator down 1 floor to radiology- no appt needed START the Lyrica  twice daily to see if pain improves Call with any questions or concerns Hang in there!

## 2024-01-29 ENCOUNTER — Other Ambulatory Visit: Payer: Self-pay

## 2024-01-29 ENCOUNTER — Ambulatory Visit: Payer: Self-pay | Admitting: Family Medicine

## 2024-01-29 DIAGNOSIS — D509 Iron deficiency anemia, unspecified: Secondary | ICD-10-CM

## 2024-01-29 LAB — IRON,TIBC AND FERRITIN PANEL
%SAT: 6 % — ABNORMAL LOW (ref 16–45)
Ferritin: 8 ng/mL — ABNORMAL LOW (ref 16–232)
Iron: 24 ug/dL — ABNORMAL LOW (ref 40–190)
TIBC: 376 ug/dL (ref 250–450)

## 2024-01-29 LAB — ANA: Anti Nuclear Antibody (ANA): NEGATIVE

## 2024-01-29 MED ORDER — VITAMIN D (ERGOCALCIFEROL) 1.25 MG (50000 UNIT) PO CAPS: 50000.0000 [IU] | ORAL_CAPSULE | ORAL | 0 refills | Status: AC

## 2024-01-29 NOTE — Progress Notes (Signed)
 Notified pt. Pt would like to know if Iron  Supplement needs to be prescribed or OTC. She does have 65 at home but has not stated taking yet. Vitamin D  has been called in. 1st B12 has been scheduled   Pt would like to know if she should be worried about ESR being high. Pt is asking if she should continue the lyrica 

## 2024-02-03 NOTE — Telephone Encounter (Signed)
 Pt has acute visit for 02/04/2024 Advise to let us  know of any changes.

## 2024-02-04 ENCOUNTER — Encounter: Payer: Self-pay | Admitting: Family Medicine

## 2024-02-04 ENCOUNTER — Ambulatory Visit (INDEPENDENT_AMBULATORY_CARE_PROVIDER_SITE_OTHER): Admitting: Family Medicine

## 2024-02-04 ENCOUNTER — Ambulatory Visit

## 2024-02-04 VITALS — BP 108/78 | HR 80 | Temp 98.0°F | Ht 65.0 in | Wt 224.0 lb

## 2024-02-04 DIAGNOSIS — J029 Acute pharyngitis, unspecified: Secondary | ICD-10-CM

## 2024-02-04 DIAGNOSIS — M25561 Pain in right knee: Secondary | ICD-10-CM | POA: Diagnosis not present

## 2024-02-04 DIAGNOSIS — E538 Deficiency of other specified B group vitamins: Secondary | ICD-10-CM

## 2024-02-04 DIAGNOSIS — R21 Rash and other nonspecific skin eruption: Secondary | ICD-10-CM | POA: Diagnosis not present

## 2024-02-04 DIAGNOSIS — M25562 Pain in left knee: Secondary | ICD-10-CM

## 2024-02-04 LAB — POC COVID19 BINAXNOW: SARS Coronavirus 2 Ag: NEGATIVE

## 2024-02-04 MED ORDER — PREDNISONE 10 MG PO TABS: ORAL_TABLET | ORAL | 0 refills | Status: DC

## 2024-02-04 MED ORDER — TRIAMCINOLONE ACETONIDE 0.1 % EX OINT: 1.0000 | TOPICAL_OINTMENT | Freq: Two times a day (BID) | CUTANEOUS | 1 refills | Status: DC

## 2024-02-04 MED ORDER — TRIAMCINOLONE ACETONIDE 0.1 % EX OINT
1.0000 | TOPICAL_OINTMENT | Freq: Two times a day (BID) | CUTANEOUS | 1 refills | Status: DC
Start: 2024-02-04 — End: 2024-02-04

## 2024-02-04 NOTE — Patient Instructions (Addendum)
 Follow up as needed or as scheduled STOP the Lyrica  START the Prednisone  as directed- 3 pills at the same time x3 days, then 2 pills at the same time x3 days, then 1 pill daily.  Take w/ food  USE the Triamcinolone  ointment as needed on the spots Call with any questions or concerns HANG IN THERE!!!

## 2024-02-04 NOTE — Progress Notes (Signed)
   Subjective:    Patient ID: Zoe Soto, female    DOB: 1980/12/06, 43 y.o.   MRN: [id number]  HPI Rash- 'i think it's coming from the Lyrica '.  Pt reports red bumps started on chest, neck and started after taking the medication.  These are itchy.  Has separate larger, darker red bumps that were present prior to Lyrica  but these are not itchy or painful.  URI- pt reports sore throat, ear pain, and HA started yesterday.  No fever.  Knee pain- bilateral.  Pt reports both knees were swollen and very painful.  Sxs started 9/26.  Feels that this is again related to Lyrica .   Review of Systems For ROS see HPI     Objective:   Physical Exam Vitals reviewed.  Constitutional:      General: She is not in acute distress.    Appearance: Normal appearance. She is not ill-appearing.  HENT:     Head: Normocephalic and atraumatic.     Right Ear: Tympanic membrane and ear canal normal.     Left Ear: Tympanic membrane and ear canal normal.     Nose: No congestion.     Comments: No TTP over frontal or maxillary sinuses    Mouth/Throat:     Pharynx: Posterior oropharyngeal erythema (copious PND) present. No oropharyngeal exudate.  Eyes:     Extraocular Movements: Extraocular movements intact.     Conjunctiva/sclera: Conjunctivae normal.  Cardiovascular:     Rate and Rhythm: Normal rate and regular rhythm.  Pulmonary:     Effort: Pulmonary effort is normal. No respiratory distress.     Breath sounds: Normal breath sounds.  Musculoskeletal:        General: Tenderness (TTP over knees bilaterally, trace edema) present.     Cervical back: Neck supple.  Lymphadenopathy:     Cervical: No cervical adenopathy.  Skin:    General: Skin is warm and dry.     Findings: Rash (maculopapular rash of chest, neck, face.  Larger, well demarcated macules scattered on trunk) present.  Neurological:     General: No focal deficit present.     Mental Status: She is alert and oriented to person, place, and time.   Psychiatric:        Mood and Affect: Mood normal.        Behavior: Behavior normal.        Thought Content: Thought content normal.           Assessment & Plan:  Rash- new.  Pt reports itchy maculopapular rash started after she began Lyrica .  Will hold medication, add to allergy list.  Start Prednisone  taper.  Pt to let me know if sxs change or fail to improve.  Sore throat- new.  Pt w/ evidence of copious PND.  No bacterial infxn on PE.  COVID negative.  She feels this is related to Lyrica  use.  More likely viral illness but can't r/o that this is medication related.  Reviewed supportive care and red flags that should prompt return.  Pt expressed understanding and is in agreement w/ plan.   Knee pain- new.  Pt reports knees became painful and swollen after starting Lyrica .  May be medication side effect.  Will start Prednisone  taper for rash and joint pain.  Pt expressed understanding and is in agreement w/ plan.

## 2024-02-11 ENCOUNTER — Ambulatory Visit (INDEPENDENT_AMBULATORY_CARE_PROVIDER_SITE_OTHER)

## 2024-02-11 DIAGNOSIS — E538 Deficiency of other specified B group vitamins: Secondary | ICD-10-CM

## 2024-02-11 MED ORDER — CYANOCOBALAMIN 1000 MCG/ML IJ SOLN
1000.0000 ug | Freq: Once | INTRAMUSCULAR | Status: AC
  Administered 2024-02-11: 1000 ug via INTRAMUSCULAR

## 2024-02-11 NOTE — Progress Notes (Signed)
 Zoe Soto is a 43 y.o. female presents in office today for a nurse visit for 2nd B12 Injection.   Patient Injection was given in the  Right deltoid. Patient tolerated injection well.   Patient's next injection due in 1 week, appt made? no  Alfredo DELENA Shope

## 2024-02-13 ENCOUNTER — Encounter (HOSPITAL_BASED_OUTPATIENT_CLINIC_OR_DEPARTMENT_OTHER): Payer: Self-pay | Admitting: Emergency Medicine

## 2024-02-13 ENCOUNTER — Ambulatory Visit: Payer: Self-pay

## 2024-02-13 ENCOUNTER — Other Ambulatory Visit: Payer: Self-pay

## 2024-02-13 ENCOUNTER — Encounter: Payer: Self-pay | Admitting: Family Medicine

## 2024-02-13 ENCOUNTER — Emergency Department (HOSPITAL_BASED_OUTPATIENT_CLINIC_OR_DEPARTMENT_OTHER)
Admission: EM | Admit: 2024-02-13 | Discharge: 2024-02-13 | Disposition: A | Discharge status: Home / Self Care | Attending: Emergency Medicine | Admitting: Emergency Medicine

## 2024-02-13 ENCOUNTER — Ambulatory Visit (INDEPENDENT_AMBULATORY_CARE_PROVIDER_SITE_OTHER): Admitting: Family Medicine

## 2024-02-13 VITALS — BP 112/70 | HR 85 | Temp 98.7°F | Resp 20 | Ht 65.0 in | Wt 222.0 lb

## 2024-02-13 DIAGNOSIS — J029 Acute pharyngitis, unspecified: Secondary | ICD-10-CM | POA: Diagnosis not present

## 2024-02-13 DIAGNOSIS — L509 Urticaria, unspecified: Secondary | ICD-10-CM | POA: Insufficient documentation

## 2024-02-13 DIAGNOSIS — Z8709 Personal history of other diseases of the respiratory system: Secondary | ICD-10-CM

## 2024-02-13 DIAGNOSIS — Z7951 Long term (current) use of inhaled steroids: Secondary | ICD-10-CM | POA: Diagnosis not present

## 2024-02-13 DIAGNOSIS — R21 Rash and other nonspecific skin eruption: Secondary | ICD-10-CM

## 2024-02-13 DIAGNOSIS — R509 Fever, unspecified: Secondary | ICD-10-CM

## 2024-02-13 DIAGNOSIS — R700 Elevated erythrocyte sedimentation rate: Secondary | ICD-10-CM | POA: Diagnosis not present

## 2024-02-13 DIAGNOSIS — Z87892 Personal history of anaphylaxis: Secondary | ICD-10-CM

## 2024-02-13 DIAGNOSIS — J45909 Unspecified asthma, uncomplicated: Secondary | ICD-10-CM | POA: Diagnosis not present

## 2024-02-13 LAB — URINALYSIS, ROUTINE W REFLEX MICROSCOPIC
Bilirubin Urine: NEGATIVE
Glucose, UA: NEGATIVE mg/dL
Ketones, ur: NEGATIVE mg/dL
Nitrite: NEGATIVE
Protein, ur: 30 mg/dL — AB
Specific Gravity, Urine: 1.01 (ref 1.005–1.030)
pH: 6.5 (ref 5.0–8.0)

## 2024-02-13 LAB — RESP PANEL BY RT-PCR (RSV, FLU A&B, COVID)  RVPGX2
Influenza A by PCR: NEGATIVE
Influenza B by PCR: NEGATIVE
Resp Syncytial Virus by PCR: NEGATIVE
SARS Coronavirus 2 by RT PCR: NEGATIVE

## 2024-02-13 LAB — GROUP A STREP BY PCR: Group A Strep by PCR: NOT DETECTED

## 2024-02-13 LAB — MONONUCLEOSIS SCREEN: Mono Screen: NEGATIVE

## 2024-02-13 MED ORDER — EPINEPHRINE 0.3 MG/0.3ML IJ SOAJ
0.3000 mg | INTRAMUSCULAR | 1 refills | Status: AC | PRN
Start: 2024-02-13 — End: ?

## 2024-02-13 NOTE — Telephone Encounter (Signed)
 Scheduled as acute vs. Hospital f/u as pt is continuing to have hives. Other symptoms improving. See below.  FYI Only or Action Required?: FYI only for provider.  Patient was last seen in primary care on 02/04/2024 by Mahlon Comer BRAVO, MD.  Called Nurse Triage reporting Urticaria.  Symptoms began yesterday.  Interventions attempted: Prescription medications: epipen .  Symptoms are: gradually improving.  Triage Disposition: See Physician Within 24 Hours  Patient/caregiver understands and will follow disposition?: Yes  Reason for Disposition  [1] Hives have become worse AND [2] taking oral steroids (e.g., prednisone ) > 24 hours  [1] MODERATE-SEVERE hives (e.g.,hives interfere with normal activities or work) AND [2] not improved after taking antihistamine (e.g., cetirizine, fexofenadine, or loratadine ) > 24 hours  Answer Assessment - Initial Assessment Questions Patient seen in ED this morning for hives, now on upper body only. Pt used epi pen prior to ED. Currently has sore throat,but improved from this morning. Had SOB prior to epi pen/ED visit, confirms resolved. Patient denies higher acuity questions.  Confirmed pt has another epi pen available, she confirmed ED provider refilled it for her. Pt states she cannot take benadryl  d/t being at work. This RN advised that she can take another non drowsy antihistamine like claritin  or zyrtec. ED/911 precautions reviewed, pt verbalized understanding.   1. APPEARANCE: What does the rash look like?      Red splotches  2. LOCATION: Where is the rash located?      Upper body  3. NUMBER: How many hives are there?      Numberous  4. SIZE: How big are the hives? (e.g., inches, cm, compare to coins) Do they all look the same or do they vary in shape and size?      Dime to penny  5. ONSET: When did the hives begin? (e.g., hours or days ago)      Has been intermittent for several weeks, was worse this morning.  6. ITCHING: Does  it itch? If Yes, ask: How bad is the itch?  (e.g., none, mild, moderate, severe)     Yes  7. RECURRENT PROBLEM: Have you had hives before? If Yes, ask: When was the last time? and What happened that time?      Yes in 2020, has epi pen for bees and ants  8. TRIGGERS: Were you exposed to any new food, plant, cosmetic product or animal just before the hives began?     Unknown  9. OTHER SYMPTOMS: Do you have any other symptoms? (e.g., fever, tongue swelling, difficulty breathing, abdomen pain)     Denies  Protocols used: Hives-A-AH

## 2024-02-13 NOTE — Patient Instructions (Addendum)
 I will check some additional bloodwork today. Cepacol lozenges can be tried for now, drink fluids frequently.  Rash may be due to a recent viral illness.  I would not stop the iron , vitamin D  or your other usual meds at this point.  I will talk with your PCP to discuss plan moving forward. Hang in there! Keep a record of your temps at home, advil if needed for bodyaches or fever, but less us  know if that returns.  Return to the clinic or go to the nearest emergency room if any of your symptoms worsen or new symptoms occur.

## 2024-02-13 NOTE — Progress Notes (Signed)
 Subjective:  Patient ID: Zoe Soto, female    DOB: Nov 29, 1980  Age: 43 y.o. MRN: [id number]  CC:  Chief Complaint  Patient presents with   Urticaria    Fever of 103.9. ST. Hoarse. Went to ED. COVID/FLU/RSV negative. Hives all over body. Used epi pen because throat was stretchy and hoarse. Sx started 2.5 weeks ago when she started lyrica  which she has stopped. Also having right ear pain that is going down to her jaw    HPI Zoe Soto presents for  Acute visit for above, PCP is Dr. Mahlon.  Fever, sore throat, hoarseness Symptoms as above.  Used epipen  for more rash hoarseness this am then ER eval.  She was seen in ED earlier today with diagnosis urticaria.Negative strep testing, negative respiratory panel including influenza, RSV, COVID testing urinalysis with moderate leukocytes, 6-10 WBC, no nitrite.  Negative monoscreen.  Urine culture ordered.  No new meds given in ER.   Chart reviewed, she was evaluated by her primary care provider 9 days ago for a new maculopapular rash after starting Lyrica .  Medication was held and started prednisone  at that time.  Sore throat at that time also thought to be related to Lyrica  use by patient, possible viral illness per PCP as possibility, supportive care discussed at that time.   I saw her in August - treated with clindamycin  for possible false negative strep tonsilitis, concern for possible early PTA - improved on recheck visit on 8/15.   Still with same R ear pain off and on since that time and sore throat off and on. Intermittent fevers. Up to 103.9 last night. Treated with theraflu. Fevers about once per week past 2 months. Same sx's recently.  Has stopped taking fluoxetine , iron  supplement, vit D.  On zepbound since Monday, no new symptoms.  Rash - all over - oval patches, past few weeks, worse yesterday. Min itching upper chest. Has rash on trunk, legs, arms. More noticeable yesterday. Initial patch started on R breast then other lesions  appeared.  Drinking fluids.  No fever today.  Menses started today, no dysuria. Slight frequency but increased water intake.   Recent lab results: Respiratory panel, negative monoscreen, strep testing as above today.  Point-of-care COVID-19 negative on 02/04/2024, sed rate elevated at 74 on 01/28/2024.  Thought to be in part due to anemia, started on iron  supplementation.  Ferritin of 8, percent sat 6, iron  24 on 01/28/2024. Negative ANA, slightly low B12 of 168 with normal folate, both on September 23.  Vitamin D  very low at less than 7 on 01/28/2024.  Had been started on vitamin D  supplementation.  Status post 2 doses of 50,000 units weekly.  Status post 2 B12 injections.   No new sexual partners. No hx of STI.   History Patient Active Problem List   Diagnosis Date Noted   Vaginal discharge 09/27/2020   Anxiety 09/26/2020   Depressive disorder 09/26/2020   Environmental and seasonal allergies 07/20/2020   Eustachian tube dysfunction 07/20/2020   Asthma    Heart palpitations 05/13/2019   Paroxysmal SVT (supraventricular tachycardia) 05/13/2019   PAC (premature atrial contraction) 05/13/2019   PVC (premature ventricular contraction) 05/13/2019   H/O laparoscopy 06/27/2018   Anemia 06/04/2018   Herpes simplex virus type 2 (HSV-2) infection affecting pregnancy in third trimester 02/21/2018   Intramural leiomyoma of uterus 05/26/2013   Irregular menstruation 08/04/2010   Past Medical History:  Diagnosis Date   Allergy    Seasonal   Asthma  Bee sting allergy    GERD (gastroesophageal reflux disease)    Herpes simplex virus (HSV) infection    History of chickenpox    Pneumonia 08/2017   RESOLVED   Past Surgical History:  Procedure Laterality Date   ABDOMINOPLASTY     ANKLE SURGERY Right 2011   DILITATION & CURRETTAGE/HYSTROSCOPY WITH NOVASURE ABLATION N/A 06/27/2018   Procedure: DILATATION & CURETTAGE/HYSTEROSCOPY WITH NOVASURE ABLATION;  Surgeon: Marne Kelly Nest, MD;   Location: [facility];  Service: Gynecology;  Laterality: N/A;   GASTRIC BYPASS  07/2015   GASTRIC BYPASS     KNEE ARTHROSCOPY Left 2010   LAPAROSCOPIC TUBAL LIGATION Bilateral 06/27/2018   Procedure: LAPAROSCOPIC TUBAL LIGATION;  Surgeon: Marne Kelly Nest, MD;  Location: [facility];  Service: Gynecology;  Laterality: Bilateral;   SMALL BOWEL REPAIR N/A 06/27/2018   Procedure: SMALL BOWEL REPAIR;  Surgeon: Marne Kelly Nest, MD;  Location: [facility];  Service: Gynecology;  Laterality: N/A;   Allergies  Allergen Reactions   Wasp Venom Anaphylaxis and Hives   Naproxen Other (See Comments)    Hematuria when taken in large doses   Beeswax    Other     Ants   Lyrica  [Pregabalin ] Rash   Tape Rash    ADHESIVE AND PAPER TAPE REMOVES SKIN   Prior to Admission medications   Medication Sig Start Date End Date Taking? Authorizing Provider  albuterol  (VENTOLIN  HFA) 108 (90 Base) MCG/ACT inhaler Inhale 2 puffs into the lungs every 6 (six) hours as needed for wheezing or shortness of breath. 07/18/22  Yes Tabori, Katherine E, MD  budesonide  (PULMICORT  FLEXHALER) 180 MCG/ACT inhaler Inhale 2 puffs into the lungs daily as needed. 04/05/21  Yes Kip Ade, NP  clonazePAM  (KLONOPIN ) 0.5 MG disintegrating tablet Take 1-2 tablets (0.5-1 mg total) by mouth 3 (three) times daily as needed (anxiety). 04/19/23  Yes Tabori, Katherine E, MD  EPINEPHrine  0.3 mg/0.3 mL IJ SOAJ injection Inject 0.3 mg into the muscle as needed for anaphylaxis. 02/13/24  Yes Mesner, Selinda, MD  FLOVENT  HFA 110 MCG/ACT inhaler Inhale 2 puffs into the lungs daily as needed. 07/23/22  Yes Mahlon Comer BRAVO, MD  FLUoxetine  (PROZAC ) 20 MG capsule TAKE 1 CAPSULE BY MOUTH EVERY DAY 01/10/24  Yes Tabori, Katherine E, MD  FLUTICASONE  PROPIONATE, NASAL, NA Place 1-2 sprays into the nose as needed (for allergies).   Yes [provider]  HYDROcodone -acetaminophen  (NORCO/VICODIN)  5-325 MG tablet Take 1 tablet by mouth every 6 (six) hours as needed for moderate pain (pain score 4-6). 12/19/23  Yes Levora Reyes SAUNDERS, MD  triamcinolone  ointment (KENALOG ) 0.1 % Apply 1 Application topically 2 (two) times daily. 02/04/24 02/03/25 Yes Tabori, Katherine E, MD  Vitamin D , Ergocalciferol , (DRISDOL ) 1.25 MG (50000 UNIT) CAPS capsule Take 1 capsule (50,000 Units total) by mouth every 7 (seven) days. 01/29/24  Yes Tabori, Katherine E, MD  ZEPBOUND 2.5 MG/0.5ML Pen Inject 2.5 mg into the skin once a week. 02/11/24  Yes [provider]  clindamycin  (CLEOCIN ) 300 MG capsule Take 1 capsule (300 mg total) by mouth 3 (three) times daily. Patient not taking: Reported on 02/13/2024 12/19/23   Levora Reyes SAUNDERS, MD  predniSONE  (DELTASONE ) 10 MG tablet 3 tabs x3 days and then 2 tabs x3 days and then 1 tab x3 days.  Take w/ food. Patient not taking: Reported on 02/13/2024 02/04/24   Mahlon Comer BRAVO, MD   Social History   Socioeconomic History   Marital  status: Widowed    Spouse name: Not on file   Number of children: 2   Years of education: 16   Highest education level: Bachelor's degree (e.g., BA, AB, BS)  Occupational History   Occupation: Administration  Tobacco Use   Smoking status: Former   Smokeless tobacco: Never  Vaping Use   Vaping status: Never Used  Substance and Sexual Activity   Alcohol use: Yes    Alcohol/week: 4.0 standard drinks of alcohol    Types: 4 Glasses of wine per week    Comment: 4 glasses of wine per week    Drug use: No   Sexual activity: Not Currently    Birth control/protection: None  Other Topics Concern   Not on file  Social History Narrative   Not on file   Social Drivers of Health   Financial Resource Strain: Low Risk  (12/17/2023)   Overall Financial Resource Strain (CARDIA)    Difficulty of Paying Living Expenses: Not hard at all  Food Insecurity: No Food Insecurity (12/17/2023)   Hunger Vital Sign    Worried About Running Out of Food  in the Last Year: Never true    Ran Out of Food in the Last Year: Never true  Transportation Needs: No Transportation Needs (12/17/2023)   PRAPARE - Administrator, Civil Service (Medical): No    Lack of Transportation (Non-Medical): No  Physical Activity: Sufficiently Active (12/17/2023)   Exercise Vital Sign    Days of Exercise per Week: 5 days    Minutes of Exercise per Session: 130 min  Stress: Stress Concern Present (12/17/2023)   [employer] of Occupational Health - Occupational Stress Questionnaire    Feeling of Stress: To some extent  Social Connections: Moderately Isolated (12/17/2023)   Social Connection and Isolation Panel    Frequency of Communication with Friends and Family: More than three times a week    Frequency of Social Gatherings with Friends and Family: Once a week    Attends Religious Services: More than 4 times per year    Active Member of [employer] or Organizations: No    Attends Banker Meetings: Not on file    Marital Status: Widowed  Intimate Partner Violence: Not on file    Review of Systems Per HPI  Objective:   Vitals:   02/13/24 1318  BP: 112/70  Pulse: 85  Resp: 20  Temp: 98.7 F (37.1 C)  TempSrc: Temporal  SpO2: 98%  Weight: 222 lb (100.7 kg)  Height: 5' 5 (1.651 m)     Physical Exam Vitals reviewed.  Constitutional:      General: She is not in acute distress.    Appearance: She is well-developed. She is not ill-appearing, toxic-appearing or diaphoretic.  HENT:     Head: Normocephalic and atraumatic.     Right Ear: Hearing, tympanic membrane, ear canal and external ear normal.     Left Ear: Hearing, tympanic membrane, ear canal and external ear normal.     Nose: Nose normal.     Mouth/Throat:     Pharynx: No posterior oropharyngeal erythema.  Eyes:     Conjunctiva/sclera: Conjunctivae normal.     Pupils: Pupils are equal, round, and reactive to light.  Cardiovascular:     Rate and Rhythm: Normal rate  and regular rhythm.     Heart sounds: Normal heart sounds. No murmur heard. Pulmonary:     Effort: Pulmonary effort is normal. No respiratory distress.     Breath  sounds: Normal breath sounds. No stridor. No wheezing or rhonchi.  Skin:    General: Skin is warm and dry.     Findings: Rash present.     Comments: See photos, diffuse faint erythematous patches, some with slight scale, oval lesions on upper chest, back, trunk.  Very faint small erythematous patches on forearms, reports fading since this morning.  No petechiae/purpura.  Neurological:     Mental Status: She is alert and oriented to person, place, and time.  Psychiatric:        Mood and Affect: Mood normal.        Behavior: Behavior normal.             I personally spent a total of 57 minutes in the care of the patient today including preparing to see the patient, getting/reviewing separately obtained history, performing a medically appropriate exam/evaluation, placing orders, and documenting clinical information in the EHR.   Assessment & Plan:  Zoe Soto is a 43 y.o. female . Rash and other nonspecific skin eruption - Plan: Sedimentation rate, Epstein-Barr virus VCA antibody panel, CMV abs, IgG+IgM (cytomegalovirus), HIV Antibody (routine testing w rflx), CBC, Antistreptolysin O titer  Fever, unspecified fever cause - Plan: Sedimentation rate, Epstein-Barr virus VCA antibody panel, CMV abs, IgG+IgM (cytomegalovirus), HIV Antibody (routine testing w rflx), CBC, Antistreptolysin O titer  Sore throat - Plan: Sedimentation rate, Epstein-Barr virus VCA antibody panel, CMV abs, IgG+IgM (cytomegalovirus), HIV Antibody (routine testing w rflx), CBC, Antistreptolysin O titer  Elevated sed rate - Plan: Sedimentation rate, Epstein-Barr virus VCA antibody panel, CMV abs, IgG+IgM (cytomegalovirus), HIV Antibody (routine testing w rflx), CBC, Antistreptolysin O titer  History of tonsillitis - Plan: Sedimentation rate,  Epstein-Barr virus VCA antibody panel, CMV abs, IgG+IgM (cytomegalovirus), HIV Antibody (routine testing w rflx), CBC, Antistreptolysin O titer   Prior exudative tonsillitis, recurrent sore throat, now with diffuse rash as above. Intermittent fevers. Question hives earlier today. No apparent urticarial lesions in office.  Clearing secretions normally, no tonsillar exudate. Uvula midline, no stridor or wheeze and reassuring exam in office, afebrile. Question prior strep infection with false negative testing. Possible pityriasis rosea rash with prior viral infection, although noted to be maculopapular previously. Still with intermittent fever as above. ? Persistent vs new viral syndrome vs rheumatologic cause with prior elevated sed rate.   - repeat sed rate, check ASO titer, CMV, EBV testing (monospot neg earlier today), CBC and HIV testing  - restart vitamin D , iron , and continue B12 with prior deficiencies  - restart prozac  - unlikely cause of rash, above sx's.   - symptomatic care, hold on new rx meds for now.   - close follow up with PCP to discuss further plan/workup with RTC/ER precautions in interim.     No orders of the defined types were placed in this encounter.  Patient Instructions  I will check some additional bloodwork today. Cepacol lozenges can be tried for now, drink fluids frequently.  Rash may be due to a recent viral illness.  I would not stop the iron , vitamin D  or your other usual meds at this point.  I will talk with your PCP to discuss plan moving forward. Hang in there! Keep a record of your temps at home, advil if needed for bodyaches or fever, but less us  know if that returns.  Return to the clinic or go to the nearest emergency room if any of your symptoms worsen or new symptoms occur.        Signed,  Reyes Pines, MD [facility], [facility] 02/13/24 1:49 PM

## 2024-02-13 NOTE — Telephone Encounter (Signed)
 FYI

## 2024-02-13 NOTE — ED Triage Notes (Addendum)
 Pt c/o allergic reaction with intermittent rash x 2 weeks, was on prednisone  for the rash. Tonight the pt took some theraflu due to fever, sore throat x 2 days, and rash. Started having worsening sore throat so she used her epipen  at about 0330.

## 2024-02-13 NOTE — Telephone Encounter (Signed)
 CAL ([facility]) called into triage, stating patient made an appt. In MyChart for hospital follow-up for Hives. Attempted to contact  patient x 1 to discuss symptoms/triage; no answer; will attempt contact at a later time.

## 2024-02-14 LAB — CBC
HCT: 32.9 % — ABNORMAL LOW (ref 36.0–46.0)
Hemoglobin: 10.3 g/dL — ABNORMAL LOW (ref 12.0–15.0)
MCHC: 31.2 g/dL (ref 30.0–36.0)
MCV: 68.7 fl — ABNORMAL LOW (ref 78.0–100.0)
Platelets: 609 K/uL — ABNORMAL HIGH (ref 150.0–400.0)
RBC: 4.79 Mil/uL (ref 3.87–5.11)
RDW: 20.9 % — ABNORMAL HIGH (ref 11.5–15.5)
WBC: 7.3 K/uL (ref 4.0–10.5)

## 2024-02-14 LAB — URINE CULTURE: Culture: 2000 — AB

## 2024-02-14 LAB — SEDIMENTATION RATE: Sed Rate: 59 mm/h — ABNORMAL HIGH (ref 0–20)

## 2024-02-14 NOTE — Telephone Encounter (Signed)
FYI update

## 2024-02-14 NOTE — Progress Notes (Signed)
 NEUROLOGY CONSULTATION NOTE  Zoe Soto MRN: [id number] DOB: Mar 16, 1981  Referring provider: Carlo Cornish, PA-C (ED referral) Primary care provider: Comer Greet, MD  Reason for consult:  headache  Assessment/Plan:   Migraine with aura, without status migrainosus, not intractable Visual disturbance - current visual phenomena uncertain.  Migraine aura as diagnosis of exclusion is possible but unusual given its brief duration at a time and occurring everyday. Other systemic symptoms - unclear etiology.  Query postviral syndrome.   Due to having had a migraine with new focal symptoms as well as ongoing visual disturbance, will check MRI of brain with and without contrast. Further recommendations pending results.  Total time spent on today's visit was 47 minutes dedicated to this patient today, preparing to see patient, examining the patient, ordering tests and/or medications and counseling the patient, documenting clinical information in the EHR or other health record, independently interpreting results and communicating results to the patient/family, discussing treatment and goals, answering patient's questions and coordinating care.     Subjective:  Zoe Soto is a 43 year old right-handed female with history of PSVT, PVCs, GERD, asthma and eustachian tube dysfunction and seasonal allergies who presents for headache.  History supplemented by ED note.  On 12/02/2023, she developed a severe pounding/throbbing headache associated with left sided facial numbness but no facial droop or unilateral numbness or weakness.  She has history of migraines since childhood but hasn't had one in 10 years and this was different.  This one did eventually turn into a migraine with nausea, photophobia and phonophobia.  She was seen in the ED where CT head performed was unremarkable.  She was given Toradol  which helped.  She hasn't had a recurrence of this new headache or her typical  migraines.  In 2015-2016, she started experiencing hives with cold exposure and was told that she had an allergy to cold.  After the ED visit for the migraine, she went on a cruise to the [place] in August.  When she returned, she developed an upper respiratory illness.  She was tested for Strep and EBV, which were negative.  Afterward, she developed various systemic symptoms.  About an hour after a shower, she would develop a fever.  She started experiencing diffuse body aches, particularly when feeling cold.  She started experiencing joint swelling.  She underwent workup.  She was found to be anemic with low iron  and low B12 of 168 as well as low vit D <7.00.  TSH was 1.84.  Sed rate was 74 but ANA was negative.  Blood work and UA not indicative of infection.  She was started on supplementation for iron , B12 and vit D.  She was started on Lyrica  for possible fibromyalgia.  This past week, she developed a full body rash, like hives.   Also, over the past week, she has been experiencing brief episodes, just lasting seconds, of sparkles in the periphery of both eyes.  Occurs off and on all day.  She notes a twinge but no actual headache.    Current NSAIDS/analgesics:  hydrocodone -acetaminophen  Current triptans:  none Current ergotamine:  none Current anti-emetic:  none Current muscle relaxants:  none Current Antihypertensive medications:  none Current Antidepressant medications:  fluoxetine  20mg  daily Current Anticonvulsant medications:  none Current anti-CGRP:  none Current Vitamins/Herbal/Supplements:  D Current Antihistamines/Decongestants:  Flonase  Other therapy:  none Other medications:  clonazepam  0.5mg -1mg  TID PRN (anxiety)    PAST MEDICAL HISTORY: Past Medical History:  Diagnosis Date   Allergy  Seasonal   Asthma    Bee sting allergy    GERD (gastroesophageal reflux disease)    Herpes simplex virus (HSV) infection    History of chickenpox    Pneumonia 08/2017   RESOLVED     PAST SURGICAL HISTORY: Past Surgical History:  Procedure Laterality Date   ABDOMINOPLASTY     ANKLE SURGERY Right 2011   DILITATION & CURRETTAGE/HYSTROSCOPY WITH NOVASURE ABLATION N/A 06/27/2018   Procedure: DILATATION & CURETTAGE/HYSTEROSCOPY WITH NOVASURE ABLATION;  Surgeon: Marne Kelly Nest, MD;  Location: [facility];  Service: Gynecology;  Laterality: N/A;   GASTRIC BYPASS  07/2015   GASTRIC BYPASS     KNEE ARTHROSCOPY Left 2010   LAPAROSCOPIC TUBAL LIGATION Bilateral 06/27/2018   Procedure: LAPAROSCOPIC TUBAL LIGATION;  Surgeon: Marne Kelly Nest, MD;  Location[facility];  Service: Gynecology;  Laterality: Bilateral;   SMALL BOWEL REPAIR N/A 06/27/2018   Procedure: SMALL BOWEL REPAIR;  Surgeon: Marne Kelly Nest, MD;  Location: [facility];  Service: Gynecology;  Laterality: N/A;    MEDICATIONS: Current Outpatient Medications on File Prior to Visit  Medication Sig Dispense Refill   albuterol  (VENTOLIN  HFA) 108 (90 Base) MCG/ACT inhaler Inhale 2 puffs into the lungs every 6 (six) hours as needed for wheezing or shortness of breath. 8 g 0   budesonide  (PULMICORT  FLEXHALER) 180 MCG/ACT inhaler Inhale 2 puffs into the lungs daily as needed. 1 each 12   clindamycin  (CLEOCIN ) 300 MG capsule Take 1 capsule (300 mg total) by mouth 3 (three) times daily. (Patient not taking: Reported on 02/13/2024) 30 capsule 0   clonazePAM  (KLONOPIN ) 0.5 MG disintegrating tablet Take 1-2 tablets (0.5-1 mg total) by mouth 3 (three) times daily as needed (anxiety). 60 tablet 1   EPINEPHrine  0.3 mg/0.3 mL IJ SOAJ injection Inject 0.3 mg into the muscle as needed for anaphylaxis. 1 each 1   FLOVENT  HFA 110 MCG/ACT inhaler Inhale 2 puffs into the lungs daily as needed. 1 each 12   FLUoxetine  (PROZAC ) 20 MG capsule TAKE 1 CAPSULE BY MOUTH EVERY DAY 90 capsule 1   FLUTICASONE  PROPIONATE, NASAL, NA Place 1-2 sprays into the nose as needed (for  allergies).     HYDROcodone -acetaminophen  (NORCO/VICODIN) 5-325 MG tablet Take 1 tablet by mouth every 6 (six) hours as needed for moderate pain (pain score 4-6). 15 tablet 0   predniSONE  (DELTASONE ) 10 MG tablet 3 tabs x3 days and then 2 tabs x3 days and then 1 tab x3 days.  Take w/ food. (Patient not taking: Reported on 02/13/2024) 18 tablet 0   triamcinolone  ointment (KENALOG ) 0.1 % Apply 1 Application topically 2 (two) times daily. 90 g 1   Vitamin D , Ergocalciferol , (DRISDOL ) 1.25 MG (50000 UNIT) CAPS capsule Take 1 capsule (50,000 Units total) by mouth every 7 (seven) days. 12 capsule 0   ZEPBOUND 2.5 MG/0.5ML Pen Inject 2.5 mg into the skin once a week.     No current facility-administered medications on file prior to visit.    ALLERGIES: Allergies  Allergen Reactions   Wasp Venom Anaphylaxis and Hives   Naproxen Other (See Comments)    Hematuria when taken in large doses   Beeswax    Other     Ants   Lyrica  [Pregabalin ] Rash   Tape Rash    ADHESIVE AND PAPER TAPE REMOVES SKIN    FAMILY HISTORY: Family History  Problem Relation Age of Onset   Alcohol abuse Mother    Heart attack Father  54   Heart disease Father    Asthma Brother    Diabetes Brother    Pancreatic cancer Maternal Grandmother    Diabetes Maternal Aunt    Congenital heart disease Son    Esophageal cancer Maternal Uncle    Esophageal cancer Maternal Uncle    Colon cancer Neg Hx    Stomach cancer Neg Hx     Objective:  Blood pressure 124/87, pulse 79, height 5' 6 (1.676 m), weight 218 lb (98.9 kg), SpO2 100%. General: No acute distress.  Patient appears well-groomed.   Head:  Normocephalic/atraumatic Eyes:  fundi examined but not visualized Neck: supple, no paraspinal tenderness, full range of motion Heart: regular rate and rhythm Neurological Exam: Mental status: alert and oriented to person, place, and time, speech fluent and not dysarthric, language intact. Cranial nerves: CN I: not tested CN  II: pupils equal, round and reactive to light, visual fields intact CN III, IV, VI:  full range of motion, no nystagmus, no ptosis CN V: facial sensation intact. CN VII: upper and lower face symmetric CN VIII: hearing intact CN IX, X: gag intact, uvula midline CN XI: sternocleidomastoid and trapezius muscles intact CN XII: tongue midline Bulk & Tone: normal, no fasciculations. Motor:  muscle strength 5/5 throughout Sensation:  Pinprick and vibratory sensation intact. Deep Tendon Reflexes:  2+ throughout,  toes downgoing.   Finger to nose testing:  Without dysmetria.   Gait:  Normal station and stride.  Romberg negative.    Thank you for allowing me to take part in the care of this patient.  Juliene Dunnings, DO  CC: Comer Greet, MD

## 2024-02-14 NOTE — ED Provider Notes (Signed)
 [place] [facility]PARTMENT AT [facility] Provider Note   CSN: [id number] Arrival date & time: 02/13/24  9651     Patient presents with: Allergic Reaction   Zoe Soto is a 43 y.o. female.   The patient is a female presenting with a chief complaint of hives and a sore throat. The hives began after taking Lyrica , which was subsequently discontinued. The patient was previously prescribed prednisone , but today used her EpiPen  due to the widespread nature of the hives and associated throat discomfort. The hives were described as being all over the body, with some areas still visible, and were accompanied by a sore throat and a sensation of needing to clear the throat. The patient reported a fever, measured at 103.52F, although she questioned the accuracy of the thermometer. She also experienced coughing, a dry throat, and pressure during urination with blood-tinged urine, though she is expecting her period soon. The patient has a history of asthma and allergies, and she is currently being evaluated for fibromyalgia, for which she was prescribed Lyrica . She denies any recent changes in her lifestyle or environment. The patient has also been experiencing low B12 and iron  levels, for which she is taking supplements, and she is scheduled to see a neurologist and hematologist for further evaluation. History was obtained from the patient.   Allergic Reaction      Prior to Admission medications   Medication Sig Start Date End Date Taking? Authorizing Provider  albuterol  (VENTOLIN  HFA) 108 (90 Base) MCG/ACT inhaler Inhale 2 puffs into the lungs every 6 (six) hours as needed for wheezing or shortness of breath. 07/18/22   Tabori, Katherine E, MD  budesonide  (PULMICORT  FLEXHALER) 180 MCG/ACT inhaler Inhale 2 puffs into the lungs daily as needed. 04/05/21   Kip Ade, NP  clindamycin  (CLEOCIN ) 300 MG capsule Take 1 capsule (300 mg total) by mouth 3 (three) times daily. Patient not  taking: Reported on 02/13/2024 12/19/23   Levora Reyes SAUNDERS, MD  clonazePAM  (KLONOPIN ) 0.5 MG disintegrating tablet Take 1-2 tablets (0.5-1 mg total) by mouth 3 (three) times daily as needed (anxiety). 04/19/23   Tabori, Katherine E, MD  EPINEPHrine  0.3 mg/0.3 mL IJ SOAJ injection Inject 0.3 mg into the muscle as needed for anaphylaxis. 02/13/24   Yazlynn Birkeland, Selinda, MD  FLOVENT  HFA 110 MCG/ACT inhaler Inhale 2 puffs into the lungs daily as needed. 07/23/22   Mahlon Comer BRAVO, MD  FLUoxetine  (PROZAC ) 20 MG capsule TAKE 1 CAPSULE BY MOUTH EVERY DAY 01/10/24   Tabori, Katherine E, MD  FLUTICASONE  PROPIONATE, NASAL, NA Place 1-2 sprays into the nose as needed (for allergies).    [provider]  HYDROcodone -acetaminophen  (NORCO/VICODIN) 5-325 MG tablet Take 1 tablet by mouth every 6 (six) hours as needed for moderate pain (pain score 4-6). 12/19/23   Levora Reyes SAUNDERS, MD  predniSONE  (DELTASONE ) 10 MG tablet 3 tabs x3 days and then 2 tabs x3 days and then 1 tab x3 days.  Take w/ food. Patient not taking: Reported on 02/13/2024 02/04/24   Tabori, Katherine E, MD  triamcinolone  ointment (KENALOG ) 0.1 % Apply 1 Application topically 2 (two) times daily. 02/04/24 02/03/25  Tabori, Katherine E, MD  Vitamin D , Ergocalciferol , (DRISDOL ) 1.25 MG (50000 UNIT) CAPS capsule Take 1 capsule (50,000 Units total) by mouth every 7 (seven) days. 01/29/24   Tabori, Katherine E, MD  ZEPBOUND 2.5 MG/0.5ML Pen Inject 2.5 mg into the skin once a week. 02/11/24   [provider]    Allergies: Wasp venom,  Naproxen, Beeswax, Other, Lyrica  [pregabalin ], and Tape    Review of Systems  Updated Vital Signs BP 132/88   Pulse 77   Temp 98.1 F (36.7 C)   Resp 14   Ht 5' 5 (1.651 m)   Wt 101.6 kg   SpO2 99%   BMI 37.27 kg/m   Physical Exam Vitals and nursing note reviewed.  Constitutional:      Appearance: She is well-developed.  HENT:     Head: Normocephalic and atraumatic.  Neck:     Comments: Mild urticaria  on anterior neck and upper chest Cardiovascular:     Rate and Rhythm: Normal rate and regular rhythm.  Pulmonary:     Effort: No respiratory distress.     Breath sounds: No stridor.  Abdominal:     General: There is no distension.  Musculoskeletal:     Cervical back: Normal range of motion.  Neurological:     Mental Status: She is alert.     (all labs ordered are listed, but only abnormal results are displayed) Labs Reviewed  URINALYSIS, ROUTINE W REFLEX MICROSCOPIC - Abnormal; Notable for the following components:      Result Value   Hgb urine dipstick MODERATE (*)    Protein, ur 30 (*)    Leukocytes,Ua MODERATE (*)    Bacteria, UA RARE (*)    All other components within normal limits  GROUP A STREP BY PCR  RESP PANEL BY RT-PCR (RSV, FLU A&B, COVID)  [id number]  URINE CULTURE  MONONUCLEOSIS SCREEN    EKG: None  Radiology: No results found.   Procedures   Medications Ordered in the ED - No data to display                                  Medical Decision Making Amount and/or Complexity of Data Reviewed Labs: ordered.  Risk Prescription drug management.   Unclear on causes for the patient's fever, infectious workup reassuring. Rash not c/w cellulitis, petechiae, purpura SJS or other emergent causes. Needs to see allergist for further workup of hives. Watched for a few hours 2/2 epi pen use (on monitor) without new symptoms or recurrent symptoms. Appears stable for d/c at this time.      Final diagnoses:  Urticaria    ED Discharge Orders          Ordered    EPINEPHrine  0.3 mg/0.3 mL IJ SOAJ injection  As needed        02/13/24 0629               Lissie Hinesley, Selinda, MD 02/14/24 [phone]

## 2024-02-15 ENCOUNTER — Telehealth (HOSPITAL_BASED_OUTPATIENT_CLINIC_OR_DEPARTMENT_OTHER): Payer: Self-pay | Admitting: *Deleted

## 2024-02-15 LAB — HIV ANTIBODY (ROUTINE TESTING W REFLEX)
HIV 1&2 Ab, 4th Generation: NONREACTIVE
HIV FINAL INTERPRETATION: NEGATIVE

## 2024-02-15 LAB — EPSTEIN-BARR VIRUS VCA ANTIBODY PANEL
EBV NA IgG: >600 U/mL — ABNORMAL HIGH
EBV VCA IgG: >750 U/mL — ABNORMAL HIGH
EBV VCA IgM: <36 U/mL

## 2024-02-15 LAB — CMV ABS, IGG+IGM (CYTOMEGALOVIRUS)
CMV IgM: 62.1 [AU]/ml — ABNORMAL HIGH
Cytomegalovirus Ab-IgG: 4.3 U/mL — ABNORMAL HIGH

## 2024-02-15 LAB — ANTISTREPTOLYSIN O TITER: ASO: 57 [IU]/mL (ref <200)

## 2024-02-15 NOTE — Telephone Encounter (Signed)
 Post ED Visit - Positive Culture Follow-up  Culture report reviewed by antimicrobial stewardship pharmacist: [facility] []  Rankin Dee, Pharm.D. []  Venetia Gully, Pharm.D., BCPS AQ-ID []  Garrel Crews, Pharm.D., BCPS []  Almarie Lunger, Pharm.D., BCPS []  [place], [address].D., BCPS, AAHIVP []  Rosaline Bihari, Pharm.D., BCPS, AAHIVP []  Vernell Meier, PharmD, BCPS []  Latanya Hint, PharmD, BCPS []  Donald Medley, PharmD, BCPS []  Rocky Bold, PharmD []  Dorothyann Alert, PharmD, BCPS [x]  Dorn Poot, PharmD  [facility] []  Rosaline Edison, PharmD []  Romona Bliss, PharmD []  Dolphus Roller, PharmD []  Veva Seip, Rph []  Vernell Daunt) Leonce, PharmD []  Eva Allis, PharmD []  Rosaline Millet, PharmD []  Iantha Batch, PharmD []  Arvin Gauss, PharmD []  Wanda Hasting, PharmD []  Ronal Rav, PharmD []  Rocky Slade, PharmD []  Bard Jeans, PharmD   Positive urine culture Seen by PCP after Ed visit. Ok. No treatment needed  Albino Alan Novak 02/15/2024, 11:30 AM

## 2024-02-17 ENCOUNTER — Ambulatory Visit (INDEPENDENT_AMBULATORY_CARE_PROVIDER_SITE_OTHER): Admitting: Neurology

## 2024-02-17 ENCOUNTER — Ambulatory Visit: Payer: Self-pay | Admitting: Family Medicine

## 2024-02-17 VITALS — BP 124/87 | HR 79 | Ht 66.0 in | Wt 218.0 lb

## 2024-02-17 DIAGNOSIS — R200 Anesthesia of skin: Secondary | ICD-10-CM | POA: Diagnosis not present

## 2024-02-17 DIAGNOSIS — H539 Unspecified visual disturbance: Secondary | ICD-10-CM | POA: Diagnosis not present

## 2024-02-17 DIAGNOSIS — G43109 Migraine with aura, not intractable, without status migrainosus: Secondary | ICD-10-CM | POA: Diagnosis not present

## 2024-02-17 NOTE — Patient Instructions (Addendum)
 Check MRI of brain with and without contrast. We have sent a referral to [facility] for your MRI and they will call you directly to schedule your appointment. They are located at [address] [facility]. If you need to contact them directly please call [phone].  Further recommendations pending results.

## 2024-02-18 ENCOUNTER — Ambulatory Visit

## 2024-02-18 ENCOUNTER — Encounter: Payer: Self-pay | Admitting: Family Medicine

## 2024-02-18 ENCOUNTER — Ambulatory Visit (INDEPENDENT_AMBULATORY_CARE_PROVIDER_SITE_OTHER): Admitting: Family Medicine

## 2024-02-18 VITALS — BP 120/68 | HR 79 | Temp 97.8°F | Ht 66.0 in | Wt 216.1 lb

## 2024-02-18 DIAGNOSIS — R700 Elevated erythrocyte sedimentation rate: Secondary | ICD-10-CM | POA: Diagnosis not present

## 2024-02-18 DIAGNOSIS — R52 Pain, unspecified: Secondary | ICD-10-CM

## 2024-02-18 DIAGNOSIS — E538 Deficiency of other specified B group vitamins: Secondary | ICD-10-CM | POA: Diagnosis not present

## 2024-02-18 DIAGNOSIS — R509 Fever, unspecified: Secondary | ICD-10-CM

## 2024-02-18 DIAGNOSIS — B259 Cytomegaloviral disease, unspecified: Secondary | ICD-10-CM | POA: Diagnosis not present

## 2024-02-18 DIAGNOSIS — R6883 Chills (without fever): Secondary | ICD-10-CM

## 2024-02-18 MED ORDER — CYANOCOBALAMIN 1000 MCG/ML IJ SOLN: 1000.0000 ug | Freq: Once | INTRAMUSCULAR | Status: DC

## 2024-02-18 NOTE — Progress Notes (Signed)
   Subjective:    Patient ID: Zoe Soto, female    DOB: [birth year], 43 y.o.   MRN: [id number]  HPI CMV infxn- pt saw Dr Levora on 10/9 and was found to have CMV infxn.  Pt was sick when she returned from a cruise this summer.  Had sore throat, fatigue, fever.  Continues to have chills 'to the bone' that requires her to take a hot bath to warm up.  This preceded her likely CMV infxn.  Tends to get fevers 1-2 hrs after exposure to hot water- bath or shower.  Is having severe pain in joints- particularly ankles bilaterally.  Is having swelling.  Continues to have widely distributed rash on trunk   Review of Systems For ROS see HPI     Objective:   Physical Exam Vitals reviewed.  Constitutional:      General: She is not in acute distress.    Appearance: Normal appearance. She is not ill-appearing.  HENT:     Head: Normocephalic and atraumatic.  Eyes:     Extraocular Movements: Extraocular movements intact.     Conjunctiva/sclera: Conjunctivae normal.  Cardiovascular:     Rate and Rhythm: Normal rate and regular rhythm.  Pulmonary:     Effort: Pulmonary effort is normal. No respiratory distress.  Musculoskeletal:     Cervical back: Neck supple.     Right lower leg: Edema (trace) present.     Left lower leg: Edema (trace) present.  Lymphadenopathy:     Cervical: No cervical adenopathy.  Skin:    Findings: Rash (diffuse truncal maculopapular rash) present.  Neurological:     General: No focal deficit present.     Mental Status: She is alert and oriented to person, place, and time.  Psychiatric:        Mood and Affect: Mood normal.        Behavior: Behavior normal.        Thought Content: Thought content normal.           Assessment & Plan:  CMV infection- new.  Pt is concerned that this may explain some of her sxs but reports she was having some sxs prior to getting sick in August.  Prior to illness she was still having fevers, shaking chills, joint pains/myalgias.  Her  ANA was negative.  Will refer to ID for evaluation of other possible causes.  Rash appears to be viral.  Will continue to monitor.

## 2024-02-18 NOTE — Patient Instructions (Signed)
 Follow up as needed or as scheduled We'll call you to schedule your Infectious Disease appt to see if we can get to the bottom of what's going on Take Ibuprofen as needed for pain, swelling, fever REST as needed.  Listen to your body. Call with any questions or concerns Hang in there!!!

## 2024-02-25 ENCOUNTER — Telehealth: Payer: Self-pay

## 2024-02-25 ENCOUNTER — Ambulatory Visit: Admitting: Family Medicine

## 2024-02-25 ENCOUNTER — Ambulatory Visit (INDEPENDENT_AMBULATORY_CARE_PROVIDER_SITE_OTHER): Admitting: Family Medicine

## 2024-02-25 DIAGNOSIS — E538 Deficiency of other specified B group vitamins: Secondary | ICD-10-CM

## 2024-02-25 MED ORDER — CYANOCOBALAMIN 1000 MCG/ML IJ SOLN: 1000.0000 ug | Freq: Once | INTRAMUSCULAR | Status: DC

## 2024-02-25 NOTE — Telephone Encounter (Signed)
 Ok to send B12 medication, syringes, and needles to pharmacy for pt

## 2024-02-25 NOTE — Telephone Encounter (Signed)
 Sent to PCP ?

## 2024-02-25 NOTE — Progress Notes (Addendum)
 Patient ID: Zoe Soto, female   DOB: April 30, 1981, 43 y.o.   MRN: [id number]  Karima Carrell is a 43 y.o. female presents in office today for a nurse visit for B12 Injection.   Patient was given B12 in right deltoid  No complications   Patient's next injection due 1 month, appt made? no  Bascom GORMAN Collet

## 2024-02-26 ENCOUNTER — Other Ambulatory Visit: Payer: Self-pay

## 2024-02-26 DIAGNOSIS — H53452 Other localized visual field defect, left eye: Secondary | ICD-10-CM | POA: Diagnosis not present

## 2024-02-26 DIAGNOSIS — G43101 Migraine with aura, not intractable, with status migrainosus: Secondary | ICD-10-CM | POA: Diagnosis not present

## 2024-02-26 DIAGNOSIS — E538 Deficiency of other specified B group vitamins: Secondary | ICD-10-CM

## 2024-02-26 MED ORDER — CYANOCOBALAMIN 1000 MCG/ML IJ SOLN: 1000.0000 ug | INTRAMUSCULAR | 0 refills | Status: AC

## 2024-02-26 NOTE — Telephone Encounter (Signed)
Sent to pharmacy for pt

## 2024-02-28 ENCOUNTER — Encounter: Payer: Self-pay | Admitting: Neurology

## 2024-03-02 ENCOUNTER — Other Ambulatory Visit: Payer: Self-pay

## 2024-03-02 ENCOUNTER — Encounter: Payer: Self-pay | Admitting: Family Medicine

## 2024-03-02 ENCOUNTER — Ambulatory Visit (INDEPENDENT_AMBULATORY_CARE_PROVIDER_SITE_OTHER): Admitting: Internal Medicine

## 2024-03-02 ENCOUNTER — Encounter: Payer: Self-pay | Admitting: Internal Medicine

## 2024-03-02 VITALS — BP 130/89 | HR 71 | Temp 98.3°F | Resp 16

## 2024-03-02 DIAGNOSIS — B259 Cytomegaloviral disease, unspecified: Secondary | ICD-10-CM | POA: Diagnosis not present

## 2024-03-02 DIAGNOSIS — M2550 Pain in unspecified joint: Secondary | ICD-10-CM

## 2024-03-02 DIAGNOSIS — D509 Iron deficiency anemia, unspecified: Secondary | ICD-10-CM

## 2024-03-02 DIAGNOSIS — D75839 Thrombocytosis, unspecified: Secondary | ICD-10-CM | POA: Diagnosis not present

## 2024-03-02 DIAGNOSIS — E559 Vitamin D deficiency, unspecified: Secondary | ICD-10-CM | POA: Diagnosis not present

## 2024-03-02 DIAGNOSIS — R509 Fever, unspecified: Secondary | ICD-10-CM | POA: Diagnosis not present

## 2024-03-02 DIAGNOSIS — Z113 Encounter for screening for infections with a predominantly sexual mode of transmission: Secondary | ICD-10-CM | POA: Diagnosis not present

## 2024-03-02 MED ORDER — FLUCONAZOLE 150 MG PO TABS: 150.0000 mg | ORAL_TABLET | Freq: Every day | ORAL | 0 refills | Status: DC

## 2024-03-02 NOTE — Progress Notes (Signed)
 Patient Active Problem List   Diagnosis Date Noted   Vaginal discharge 09/27/2020   Anxiety 09/26/2020   Depressive disorder 09/26/2020   Environmental and seasonal allergies 07/20/2020   Eustachian tube dysfunction 07/20/2020   Asthma    Heart palpitations 05/13/2019   Paroxysmal SVT (supraventricular tachycardia) 05/13/2019   PAC (premature atrial contraction) 05/13/2019   PVC (premature ventricular contraction) 05/13/2019   H/O laparoscopy 06/27/2018   Anemia 06/04/2018   Herpes simplex virus type 2 (HSV-2) infection affecting pregnancy in third trimester 02/21/2018   Intramural leiomyoma of uterus 05/26/2013   Irregular menstruation 08/04/2010    Patient's Medications  New Prescriptions   No medications on file  Previous Medications   ALBUTEROL  (VENTOLIN  HFA) 108 (90 BASE) MCG/ACT INHALER    Inhale 2 puffs into the lungs every 6 (six) hours as needed for wheezing or shortness of breath.   BUDESONIDE  (PULMICORT  FLEXHALER) 180 MCG/ACT INHALER    Inhale 2 puffs into the lungs daily as needed.   CLONAZEPAM  (KLONOPIN ) 0.5 MG DISINTEGRATING TABLET    Take 1-2 tablets (0.5-1 mg total) by mouth 3 (three) times daily as needed (anxiety).   CYANOCOBALAMIN  (VITAMIN B12) 1000 MCG/ML INJECTION    Inject 1 mL (1,000 mcg total) into the muscle every 30 (thirty) days.   EPINEPHRINE  0.3 MG/0.3 ML IJ SOAJ INJECTION    Inject 0.3 mg into the muscle as needed for anaphylaxis.   FLOVENT  HFA 110 MCG/ACT INHALER    Inhale 2 puffs into the lungs daily as needed.   FLUOXETINE  (PROZAC ) 20 MG CAPSULE    TAKE 1 CAPSULE BY MOUTH EVERY DAY   FLUTICASONE  PROPIONATE, NASAL, NA    Place 1-2 sprays into the nose as needed (for allergies).   HYDROCODONE -ACETAMINOPHEN  (NORCO/VICODIN) 5-325 MG TABLET    Take 1 tablet by mouth every 6 (six) hours as needed for moderate pain (pain score 4-6).   VITAMIN D , ERGOCALCIFEROL , (DRISDOL ) 1.25 MG (50000 UNIT) CAPS CAPSULE    Take 1 capsule (50,000 Units total)  by mouth every 7 (seven) days.   ZEPBOUND 2.5 MG/0.5ML PEN    Inject 2.5 mg into the skin once a week.  Modified Medications   No medications on file  Discontinued Medications   No medications on file    Subjective:  Today 03/02/24 : Discussed the use of AI scribe software for clinical note transcription with the patient, who gave verbal consent to proceed. Zoe Soto is a 43 year old female who presents with persistent symptoms following a positive CMV test.  Following a cruise on January 04, 2024, she experienced facial numbness and high blood pressure, leading to a hospital visit where she was diagnosed with a migraine. She has a history of migraines occurring once or twice a year about 16-20 years ago, but they ceased after her daughter's birth until July 2025. Sees neurology-brain mri  Upon returning from the cruise, she developed symptoms resembling strep throat, including a sore throat, white patches, and fever. Despite negative strep tests, she was treated with antibiotics. Further tests for other conditions were negative until a CMV test returned positive. She experienced these symptoms approximately one to two days after returning from the cruise.  She describes persistent body aches and joint pain, particularly in her feet, knees, and elbows, with pain levels reaching 7 out of 10 in her joints and 5 out of 10 in her body. The pain is exacerbated by cold and is present daily.  She also reports swelling in her feet and ankles, making it difficult to walk.  She experiences fevers, particularly after taking hot baths, with temperatures reaching up to 101F. These fevers are not consistent every night but occur frequently after baths. Her body temperature seems to regulate when she dresses warmly before bed.  She has a history of low iron  and vitamin deficiencies, including vitamin D , which contribute to her feeling cold. She also reports a rash that started more than a month ago, which is  improving but still present. The rash has been documented with photographs.  She mentions a history of yeast infections following antibiotic use and notes a recent episode coinciding with her menstrual cycle, which she attributes to perimenopause. Her periods have been irregular, with spotting and prolonged duration. Acitve with BF.   She works at a education [employer], environmental as a pensions consultant. She does not smoke or use drugs and drinks wine socially.   Review of Systems: Review of Systems  All other systems reviewed and are negative.   Past Medical History:  Diagnosis Date   Allergy    Seasonal   Anxiety    Asthma    Bee sting allergy    GERD (gastroesophageal reflux disease)    Herpes simplex virus (HSV) infection    History of chickenpox    Pneumonia 08/2017   RESOLVED    Social History   Tobacco Use   Smoking status: Former   Smokeless tobacco: Never  Vaping Use   Vaping status: Never Used  Substance Use Topics   Alcohol use: Yes    Alcohol/week: 4.0 standard drinks of alcohol    Types: 4 Glasses of wine per week    Comment: 4 glasses of wine per week    Drug use: No    Family History  Problem Relation Age of Onset   Alcohol abuse Mother    Heart attack Father 13   Heart disease Father    Asthma Brother    Diabetes Brother    Pancreatic cancer Maternal Grandmother    Diabetes Maternal Aunt    Congenital heart disease Son    Esophageal cancer Maternal Uncle    Esophageal cancer Maternal Uncle    Colon cancer Neg Hx    Stomach cancer Neg Hx     Allergies  Allergen Reactions   Wasp Venom Anaphylaxis and Hives   Naproxen Other (See Comments)    Hematuria when taken in large doses   Beeswax    Other     Ants   Lyrica  [Pregabalin ] Rash   Tape Rash    ADHESIVE AND PAPER TAPE REMOVES SKIN    Health Maintenance  Topic Date Due   Pneumococcal Vaccine (1 of 2 - PCV) Never done   Mammogram  Never done   Influenza Vaccine  12/06/2023   Cervical Cancer Screening  (HPV/Pap Cotest)  08/29/2026   Colonoscopy  06/01/2027   DTaP/Tdap/Td (5 - Tdap) 01/21/2031   Hepatitis B Vaccines 19-59 Average Risk  Completed   HPV VACCINES  Completed   Hepatitis C Screening  Completed   HIV Screening  Completed   Meningococcal B Vaccine  Aged Out   COVID-19 Vaccine  Discontinued    Objective:  Vitals:   03/02/24 0947  BP: 130/89  Pulse: 71  Resp: 16  Temp: 98.3 F (36.8 C)  TempSrc: Oral  SpO2: 100%   There is no height or weight on file to calculate BMI.  Physical Exam Constitutional:  Appearance: Normal appearance.  HENT:     Head: Normocephalic and atraumatic.     Right Ear: Tympanic membrane normal.     Left Ear: Tympanic membrane normal.     Nose: Nose normal.     Mouth/Throat:     Mouth: Mucous membranes are moist.  Eyes:     Extraocular Movements: Extraocular movements intact.     Conjunctiva/sclera: Conjunctivae normal.     Pupils: Pupils are equal, round, and reactive to light.  Cardiovascular:     Rate and Rhythm: Normal rate and regular rhythm.     Heart sounds: No murmur heard.    No friction rub. No gallop.  Pulmonary:     Effort: Pulmonary effort is normal.     Breath sounds: Normal breath sounds.  Abdominal:     General: Abdomen is flat.     Palpations: Abdomen is soft.  Musculoskeletal:        General: Normal range of motion.  Skin:    General: Skin is warm and dry.  Neurological:     General: No focal deficit present.     Mental Status: She is alert and oriented to person, place, and time.  Psychiatric:        Mood and Affect: Mood normal.    Physical Exam   Lab Results Lab Results  Component Value Date   WBC 7.3 02/13/2024   HGB 10.3 (L) 02/13/2024   HCT 32.9 (L) 02/13/2024   MCV 68.7 Repeated and verified X2. (L) 02/13/2024   PLT 609.0 (H) 02/13/2024    Lab Results  Component Value Date   CREATININE 0.94 01/28/2024   BUN 11 01/28/2024   NA 139 01/28/2024   K 3.9 01/28/2024   CL 104 01/28/2024    CO2 27 01/28/2024    Lab Results  Component Value Date   ALT 8 01/28/2024   AST 13 01/28/2024   ALKPHOS 65 01/28/2024   BILITOT 0.4 01/28/2024    Lab Results  Component Value Date   CHOL 151 04/05/2021   HDL 61.70 04/05/2021   LDLCALC 75 04/05/2021   TRIG 71.0 04/05/2021   CHOLHDL 2 04/05/2021   Lab Results  Component Value Date   LABRPR Non Reactive 06/19/2022   No results found for: HIV1RNAQUANT, HIV1RNAVL, CD4TABS   Problem List Items Addressed This Visit   None  Results   Assessment/Plan Polyarthralgia and myalgia with intermittent joint swelling and recurrent fever, unclear etiology .  Chronic polyarthralgia and myalgia with intermittent joint swelling and recurrent fever since August. Will add w/u for autoimmune conditions(01/28/24 ana negative),  . Infectious etiology considered less likely but will order additional tests as below. Post viral syndrome generally is not this chronic.   - Order CMV DNA test. - Order blood culture. - Order acute hepatitis panel. - Order additional labs to rule out autoimmune conditions. - F/u in 2 weeks  Thrombocytosis Elevated platelet count of 602,000, trending upwards since July. - Referral to hematology already scheduled for further evaluation.  Cytomegalovirus (CMV) infection Positive CMV serology, but current symptoms unlikely attributed to active CMV infection due to normal liver function tests. Possible past CMV infection in August, but not etiology of current symptoms.  Iron  deficiency and vitamin D  deficiency Low iron  and vitamin D  levels, contributing to feeling cold and fatigued.  Suspected candidal vulvovaginitis Symptoms suggestive of candidal vulvovaginitis, likely secondary to recent antibiotic use and menstrual cycle changes. -rx diflucan   Loney Stank, MD [facility] 03/02/2024, 9:57 AM   I have personally spent 112 minutes involved in face-to-face  and non-face-to-face activities for this patient on the day of the visit. Professional time spent includes the following activities: Preparing to see the patient (review of tests), Obtaining and/or reviewing separately obtained history (admission/discharge record), Performing a medically appropriate examination and/or evaluation , Ordering medications/tests/procedures, referring and communicating with other health care professionals, Documenting clinical information in the EMR, Independently interpreting results (not separately reported), Communicating results to the patient/family/caregiver, Counseling and educating the patient/family/caregiver and Care coordination (not separately reported).

## 2024-03-03 ENCOUNTER — Other Ambulatory Visit

## 2024-03-08 LAB — CULTURE, BLOOD (SINGLE)
MICRO NUMBER:: 17153933
Result:: NO GROWTH
SPECIMEN QUALITY:: ADEQUATE

## 2024-03-09 ENCOUNTER — Inpatient Hospital Stay

## 2024-03-09 ENCOUNTER — Inpatient Hospital Stay: Attending: Oncology | Admitting: Oncology

## 2024-03-09 ENCOUNTER — Encounter: Payer: Self-pay | Admitting: Oncology

## 2024-03-09 ENCOUNTER — Ambulatory Visit (HOSPITAL_BASED_OUTPATIENT_CLINIC_OR_DEPARTMENT_OTHER)
Admission: RE | Admit: 2024-03-09 | Discharge: 2024-03-09 | Disposition: A | Discharge status: Home / Self Care | Source: Ambulatory Visit | Attending: Oncology | Admitting: Oncology

## 2024-03-09 ENCOUNTER — Telehealth: Payer: Self-pay

## 2024-03-09 ENCOUNTER — Other Ambulatory Visit: Payer: Self-pay

## 2024-03-09 VITALS — BP 120/88 | HR 80 | Temp 98.8°F | Resp 18 | Ht 66.0 in | Wt 212.1 lb

## 2024-03-09 DIAGNOSIS — F32A Depression, unspecified: Secondary | ICD-10-CM | POA: Diagnosis present

## 2024-03-09 DIAGNOSIS — Z800 Family history of malignant neoplasm of digestive organs: Secondary | ICD-10-CM | POA: Insufficient documentation

## 2024-03-09 DIAGNOSIS — Z888 Allergy status to other drugs, medicaments and biological substances status: Secondary | ICD-10-CM | POA: Diagnosis not present

## 2024-03-09 DIAGNOSIS — D509 Iron deficiency anemia, unspecified: Secondary | ICD-10-CM

## 2024-03-09 DIAGNOSIS — R591 Generalized enlarged lymph nodes: Secondary | ICD-10-CM | POA: Diagnosis not present

## 2024-03-09 DIAGNOSIS — Z5329 Procedure and treatment not carried out because of patient's decision for other reasons: Secondary | ICD-10-CM | POA: Diagnosis present

## 2024-03-09 DIAGNOSIS — K219 Gastro-esophageal reflux disease without esophagitis: Secondary | ICD-10-CM | POA: Diagnosis present

## 2024-03-09 DIAGNOSIS — Z87891 Personal history of nicotine dependence: Secondary | ICD-10-CM | POA: Insufficient documentation

## 2024-03-09 DIAGNOSIS — A523 Neurosyphilis, unspecified: Secondary | ICD-10-CM | POA: Diagnosis not present

## 2024-03-09 DIAGNOSIS — Z9109 Other allergy status, other than to drugs and biological substances: Secondary | ICD-10-CM | POA: Diagnosis not present

## 2024-03-09 DIAGNOSIS — D75839 Thrombocytosis, unspecified: Secondary | ICD-10-CM | POA: Diagnosis present

## 2024-03-09 DIAGNOSIS — E66811 Obesity, class 1: Secondary | ICD-10-CM | POA: Diagnosis present

## 2024-03-09 DIAGNOSIS — J4540 Moderate persistent asthma, uncomplicated: Secondary | ICD-10-CM | POA: Diagnosis present

## 2024-03-09 DIAGNOSIS — Z0389 Encounter for observation for other suspected diseases and conditions ruled out: Secondary | ICD-10-CM | POA: Diagnosis not present

## 2024-03-09 DIAGNOSIS — Z91030 Bee allergy status: Secondary | ICD-10-CM | POA: Diagnosis not present

## 2024-03-09 DIAGNOSIS — H5319 Other subjective visual disturbances: Secondary | ICD-10-CM | POA: Diagnosis present

## 2024-03-09 DIAGNOSIS — Z8249 Family history of ischemic heart disease and other diseases of the circulatory system: Secondary | ICD-10-CM | POA: Diagnosis not present

## 2024-03-09 DIAGNOSIS — M2559 Pain in other specified joint: Secondary | ICD-10-CM | POA: Diagnosis present

## 2024-03-09 DIAGNOSIS — R221 Localized swelling, mass and lump, neck: Secondary | ICD-10-CM | POA: Diagnosis not present

## 2024-03-09 DIAGNOSIS — N920 Excessive and frequent menstruation with regular cycle: Secondary | ICD-10-CM | POA: Insufficient documentation

## 2024-03-09 DIAGNOSIS — Z8701 Personal history of pneumonia (recurrent): Secondary | ICD-10-CM | POA: Diagnosis not present

## 2024-03-09 DIAGNOSIS — M25471 Effusion, right ankle: Secondary | ICD-10-CM | POA: Insufficient documentation

## 2024-03-09 DIAGNOSIS — M2550 Pain in unspecified joint: Secondary | ICD-10-CM | POA: Diagnosis not present

## 2024-03-09 DIAGNOSIS — A5149 Other secondary syphilitic conditions: Secondary | ICD-10-CM | POA: Diagnosis not present

## 2024-03-09 DIAGNOSIS — Z6833 Body mass index (BMI) 33.0-33.9, adult: Secondary | ICD-10-CM | POA: Diagnosis not present

## 2024-03-09 DIAGNOSIS — F419 Anxiety disorder, unspecified: Secondary | ICD-10-CM | POA: Diagnosis present

## 2024-03-09 DIAGNOSIS — R519 Headache, unspecified: Secondary | ICD-10-CM | POA: Diagnosis not present

## 2024-03-09 DIAGNOSIS — Z79899 Other long term (current) drug therapy: Secondary | ICD-10-CM | POA: Diagnosis not present

## 2024-03-09 DIAGNOSIS — Z825 Family history of asthma and other chronic lower respiratory diseases: Secondary | ICD-10-CM | POA: Diagnosis not present

## 2024-03-09 DIAGNOSIS — A539 Syphilis, unspecified: Secondary | ICD-10-CM | POA: Diagnosis not present

## 2024-03-09 DIAGNOSIS — Z7951 Long term (current) use of inhaled steroids: Secondary | ICD-10-CM | POA: Diagnosis not present

## 2024-03-09 DIAGNOSIS — Z8619 Personal history of other infectious and parasitic diseases: Secondary | ICD-10-CM | POA: Diagnosis not present

## 2024-03-09 DIAGNOSIS — M549 Dorsalgia, unspecified: Secondary | ICD-10-CM | POA: Diagnosis not present

## 2024-03-09 DIAGNOSIS — Z9884 Bariatric surgery status: Secondary | ICD-10-CM | POA: Diagnosis not present

## 2024-03-09 DIAGNOSIS — A530 Latent syphilis, unspecified as early or late: Secondary | ICD-10-CM | POA: Diagnosis present

## 2024-03-09 DIAGNOSIS — M25472 Effusion, left ankle: Secondary | ICD-10-CM | POA: Insufficient documentation

## 2024-03-09 DIAGNOSIS — Z886 Allergy status to analgesic agent status: Secondary | ICD-10-CM | POA: Diagnosis not present

## 2024-03-09 LAB — CBC WITH DIFFERENTIAL (CANCER CENTER ONLY)
Abs Immature Granulocytes: 0.02 K/uL (ref 0.00–0.07)
Basophils Absolute: 0 K/uL (ref 0.0–0.1)
Basophils Relative: 0 %
Eosinophils Absolute: 0.1 K/uL (ref 0.0–0.5)
Eosinophils Relative: 1 %
HCT: 33.6 % — ABNORMAL LOW (ref 36.0–46.0)
Hemoglobin: 10.5 g/dL — ABNORMAL LOW (ref 12.0–15.0)
Immature Granulocytes: 0 %
Lymphocytes Relative: 19 %
Lymphs Abs: 1.2 K/uL (ref 0.7–4.0)
MCH: 22 pg — ABNORMAL LOW (ref 26.0–34.0)
MCHC: 31.3 g/dL (ref 30.0–36.0)
MCV: 70.4 fL — ABNORMAL LOW (ref 80.0–100.0)
Monocytes Absolute: 0.5 K/uL (ref 0.1–1.0)
Monocytes Relative: 9 %
Neutro Abs: 4.3 K/uL (ref 1.7–7.7)
Neutrophils Relative %: 71 %
Platelet Count: 521 K/uL — ABNORMAL HIGH (ref 150–400)
RBC: 4.77 MIL/uL (ref 3.87–5.11)
RDW: 20.1 % — ABNORMAL HIGH (ref 11.5–15.5)
WBC Count: 6.1 K/uL (ref 4.0–10.5)
nRBC: 0 % (ref 0.0–0.2)

## 2024-03-09 LAB — IRON AND TIBC
Iron: 33 ug/dL (ref 28–170)
Saturation Ratios: 9 % — ABNORMAL LOW (ref 10.4–31.8)
TIBC: 385 ug/dL (ref 250–450)
UIBC: 352 ug/dL

## 2024-03-09 LAB — FERRITIN: Ferritin: 27 ng/mL (ref 11–307)

## 2024-03-09 LAB — SEDIMENTATION RATE: Sed Rate: 41 mm/h — ABNORMAL HIGH (ref 0–22)

## 2024-03-09 LAB — C-REACTIVE PROTEIN: CRP: 2.4 mg/dL — ABNORMAL HIGH (ref <1.0)

## 2024-03-09 NOTE — Assessment & Plan Note (Addendum)
 Recent onset of generalized lymphadenopathy with palpable nodes in the neck, groin, and axillary regions. Nodes are mobile and not fixed. No associated symptoms of infection such as sore throat or fever. - Ordered ultrasound of the neck to evaluate lymph nodes - Will consider referral to ENT or surgeon for biopsy if indicated - Recently CMV IgG and IgM were positive but PCR was negative.  RPR positive.  Treponema pallidum antibodies pending.  Has ID follow-up.

## 2024-03-09 NOTE — Assessment & Plan Note (Signed)
 Likely reactionary to iron  deficiency state.  We will check JAK2 mutation analysis with reflex testing to rule out primary myeloproliferative neoplasm

## 2024-03-09 NOTE — Assessment & Plan Note (Signed)
 Chronic iron  deficiency anemia with hemoglobin at 9.4 g/dL. Platelet count elevated at 609,000/L, likely reactive to iron  deficiency. No history of iron  infusions. Currently on over-the-counter iron  supplements since August.  Occasional heavy menstrual bleeding but no other significant blood loss.   No need for transfusion as hemoglobin is above 7 g/dL.  Labs today revealed stable hemoglobin of 10.5, MCV remains low at 70.4.  White count 6100 with normal differential.  Platelet count remains elevated at 521,000.  Ferritin normal at 27.  Iron  studies pending.  - Will consider iron  infusion if iron  studies indicate deficiency  - Continue oral iron  supplementation for now.

## 2024-03-09 NOTE — Progress Notes (Signed)
 [facility]  HEMATOLOGY CLINIC CONSULTATION NOTE   PATIENT NAME: Zoe Soto   MR#: [id number] DOB: 1980-12-31  DATE OF SERVICE: 03/09/2024  Patient Care Team: Mahlon Comer BRAVO, MD as PCP - General (Family Medicine) Lonni Slain, MD as PCP - Cardiology (Cardiology)  REASON FOR CONSULTATION/ CHIEF COMPLAINT:  Evaluation of anemia.  ASSESSMENT & PLAN:   Zoe Soto is a 43 y.o. lady with a past medical history of GERD, history of HSV infection, asthma, anxiety, was referred to our service for evaluation of iron  deficiency anemia.    Iron  deficiency anemia Chronic iron  deficiency anemia with hemoglobin at 9.4 g/dL. Platelet count elevated at 609,000/L, likely reactive to iron  deficiency. No history of iron  infusions. Currently on over-the-counter iron  supplements since August.  Occasional heavy menstrual bleeding but no other significant blood loss.   No need for transfusion as hemoglobin is above 7 g/dL.  Labs today revealed stable hemoglobin of 10.5, MCV remains low at 70.4.  White count 6100 with normal differential.  Platelet count remains elevated at 521,000.  Ferritin normal at 27.  Iron  studies pending.  - Will consider iron  infusion if iron  studies indicate deficiency  - Continue oral iron  supplementation for now.  Thrombocytosis Likely reactionary to iron  deficiency state.  We will check JAK2 mutation analysis with reflex testing to rule out primary myeloproliferative neoplasm  Lymphadenopathy Recent onset of generalized lymphadenopathy with palpable nodes in the neck, groin, and axillary regions. Nodes are mobile and not fixed. No associated symptoms of infection such as sore throat or fever. - Ordered ultrasound of the neck to evaluate lymph nodes - Will consider referral to ENT or surgeon for biopsy if indicated - Recently CMV IgG and IgM were positive but PCR was negative.  RPR positive.  Treponema pallidum antibodies pending.  Has  ID follow-up.  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 Pain is severe and migratory, with no clear etiology. Previous tests for autoimmune conditions including ANA, rheumatoid factor, and CCP were negative. No evidence of infection or autoimmune disease. Differential includes possible bone marrow mutation or other hematological condition. - Ordered JAK2 mutation test to evaluate for bone marrow mutation - Ordered inflammatory markers including ESR and CRP  Dizziness Chronic dizziness, possibly related to anemia. Previous MRI for migraines was unremarkable. Dizziness may improve with correction of anemia. - Monitor hemoglobin levels and will consider iron  infusion if indicated  I reviewed lab results and outside records for this visit and discussed relevant results with the patient. Diagnosis, plan of care and treatment options were also discussed in detail with the patient. Opportunity provided to ask questions and answers provided to her apparent satisfaction. Provided instructions to call our clinic with any problems, questions or concerns prior to return visit. I recommended to continue follow-up with PCP and sub-specialists. She verbalized understanding and agreed with the plan. No barriers to learning was detected.  Daimion Adamcik, MD  [facility] - A DEPT OF [facility] [address] [place] [address] Dept: [phone] Dept Fax: [phone]  03/09/2024 4:40 PM  HISTORY OF PRESENT ILLNESS:  Discussed the use of AI scribe software for clinical note transcription with the patient, who gave verbal consent to proceed.  History of Present Illness Zoe Soto is a 43 year old female who presents with persistent pain and high inflammatory markers. She was referred by her primary  doctor for evaluation of anemia and high platelet  count.  She has a history of anemia with a hemoglobin level of 9.4 g/dL and elevated platelet count, reaching up to 609,000/L. She has been taking over-the-counter iron  supplements since August but is unsure of their effectiveness. She has not received iron  infusions previously.  She experiences persistent, severe, migratory joint pain affecting her knees, ankles, wrists, elbows, and hips. The pain is deep, severe, and often accompanied by swelling, making movement difficult. The pain migrates as one area improves.  She also experiences light sensitivity, floaters, and ringing in her ears, described as 'ocular migraines' occurring frequently throughout the day.  She has noticed lymphadenopathy with small, hard lymph nodes in her neck, groin, and underarms for about two weeks, fluctuating in size. She denies significant blood loss, gum bleeding, or blood in stools.  She frequently experiences dizziness, even when sitting or standing, and reports a burning sensation when scratching her skin. She has a history of eustachian tube dysfunction, contributing to ear pressure and discomfort.  Her menstrual cycles have recently become heavier and more painful, although they were previously regular and uncomplicated. She has been taking aspirin more frequently due to pain but not on a daily basis.    MEDICAL HISTORY:  Past Medical History:  Diagnosis Date   Allergy    Seasonal   Anxiety    Asthma    Bee sting allergy    GERD (gastroesophageal reflux disease)    Herpes simplex virus (HSV) infection    History of chickenpox    Pneumonia 08/2017   RESOLVED    SURGICAL HISTORY: Past Surgical History:  Procedure Laterality Date   ABDOMINOPLASTY     ANKLE SURGERY Right 2011   DILITATION & CURRETTAGE/HYSTROSCOPY WITH NOVASURE ABLATION N/A 06/27/2018   Procedure: DILATATION & CURETTAGE/HYSTEROSCOPY WITH NOVASURE ABLATION;  Surgeon: Marne Kelly Nest, MD;  Location: [facility];   Service: Gynecology;  Laterality: N/A;   GASTRIC BYPASS  07/2015   GASTRIC BYPASS     KNEE ARTHROSCOPY Left 2010   LAPAROSCOPIC TUBAL LIGATION Bilateral 06/27/2018   Procedure: LAPAROSCOPIC TUBAL LIGATION;  Surgeon: Marne Kelly Nest, MD;  Location: [facility];  Service: Gynecology;  Laterality: Bilateral;   SMALL BOWEL REPAIR N/A 06/27/2018   Procedure: SMALL BOWEL REPAIR;  Surgeon: Marne Kelly Nest, MD;  Location: [facility];  Service: Gynecology;  Laterality: N/A;    SOCIAL HISTORY: She reports that she has quit smoking. She has never used smokeless tobacco. She reports current alcohol use of about 4.0 standard drinks of alcohol per week. She reports that she does not use drugs. Social History   Socioeconomic History   Marital status: Widowed    Spouse name: Not on file   Number of children: 2   Years of education: 16   Highest education level: Bachelor's degree (e.g., BA, AB, BS)  Occupational History   Occupation: Administration  Tobacco Use   Smoking status: Former   Smokeless tobacco: Never  Vaping Use   Vaping status: Never Used  Substance and Sexual Activity   Alcohol use: Yes    Alcohol/week: 4.0 standard drinks of alcohol    Types: 4 Glasses of wine per week    Comment: 4 glasses of wine per week    Drug use: No   Sexual activity: Not Currently    Birth control/protection: None  Other Topics Concern   Not on file  Social History Narrative   Right handed  Social Drivers of Corporate Investment Banker Strain: Low Risk  (02/17/2024)   Overall Financial Resource Strain (CARDIA)    Difficulty of Paying Living Expenses: Not very hard  Food Insecurity: No Food Insecurity (03/09/2024)   Hunger Vital Sign    Worried About Running Out of Food in the Last Year: Never true    Ran Out of Food in the Last Year: Never true  Transportation Needs: No Transportation Needs (03/09/2024)   PRAPARE - Scientist, Research (physical Sciences) (Medical): No    Lack of Transportation (Non-Medical): No  Physical Activity: Inactive (02/17/2024)   Exercise Vital Sign    Days of Exercise per Week: 0 days    Minutes of Exercise per Session: Not on file  Stress: No Stress Concern Present (02/17/2024)   [employer] of Occupational Health - Occupational Stress Questionnaire    Feeling of Stress: Not at all  Recent Concern: Stress - Stress Concern Present (12/17/2023)   [employer] of Occupational Health - Occupational Stress Questionnaire    Feeling of Stress: To some extent  Social Connections: Unknown (02/17/2024)   Social Connection and Isolation Panel    Frequency of Communication with Friends and Family: More than three times a week    Frequency of Social Gatherings with Friends and Family: More than three times a week    Attends Religious Services: More than 4 times per year    Active Member of [employer] or Organizations: Patient declined    Attends Banker Meetings: Not on file    Marital Status: Widowed  Recent Concern: Social Connections - Moderately Isolated (12/17/2023)   Social Connection and Isolation Panel    Frequency of Communication with Friends and Family: More than three times a week    Frequency of Social Gatherings with Friends and Family: Once a week    Attends Religious Services: More than 4 times per year    Active Member of [employer] or Organizations: No    Attends Banker Meetings: Not on file    Marital Status: Widowed  Intimate Partner Violence: Not At Risk (03/09/2024)   Humiliation, Afraid, Rape, and Kick questionnaire    Fear of Current or Ex-Partner: No    Emotionally Abused: No    Physically Abused: No    Sexually Abused: No    FAMILY HISTORY: Family History  Problem Relation Age of Onset   Alcohol abuse Mother    Heart attack Father 70   Heart disease Father    Asthma Brother    Diabetes Brother    Pancreatic cancer Maternal Grandmother     Diabetes Maternal Aunt    Congenital heart disease Son    Esophageal cancer Maternal Uncle    Esophageal cancer Maternal Uncle    Colon cancer Neg Hx    Stomach cancer Neg Hx     ALLERGIES:  She is allergic to wasp venom, naproxen, beeswax, other, lyrica  [pregabalin ], and tape.  MEDICATIONS:  Current Outpatient Medications  Medication Sig Dispense Refill   albuterol  (VENTOLIN  HFA) 108 (90 Base) MCG/ACT inhaler Inhale 2 puffs into the lungs every 6 (six) hours as needed for wheezing or shortness of breath. 8 g 0   budesonide  (PULMICORT  FLEXHALER) 180 MCG/ACT inhaler Inhale 2 puffs into the lungs daily as needed. 1 each 12   clonazePAM  (KLONOPIN ) 0.5 MG disintegrating tablet Take 1-2 tablets (0.5-1 mg total) by mouth 3 (three) times daily as needed (anxiety). 60 tablet 1   cyanocobalamin  (VITAMIN  B12) 1000 MCG/ML injection Inject 1 mL (1,000 mcg total) into the muscle every 30 (thirty) days. 10 mL 0   EPINEPHrine  0.3 mg/0.3 mL IJ SOAJ injection Inject 0.3 mg into the muscle as needed for anaphylaxis. 1 each 1   FLOVENT  HFA 110 MCG/ACT inhaler Inhale 2 puffs into the lungs daily as needed. 1 each 12   fluconazole  (DIFLUCAN ) 150 MG tablet Take 1 tablet (150 mg total) by mouth daily. 1 tablet 0   FLUoxetine  (PROZAC ) 20 MG capsule TAKE 1 CAPSULE BY MOUTH EVERY DAY 90 capsule 1   FLUTICASONE  PROPIONATE, NASAL, NA Place 1-2 sprays into the nose as needed (for allergies).     HYDROcodone -acetaminophen  (NORCO/VICODIN) 5-325 MG tablet Take 1 tablet by mouth every 6 (six) hours as needed for moderate pain (pain score 4-6). 15 tablet 0   Vitamin D , Ergocalciferol , (DRISDOL ) 1.25 MG (50000 UNIT) CAPS capsule Take 1 capsule (50,000 Units total) by mouth every 7 (seven) days. 12 capsule 0   ZEPBOUND 5 MG/0.5ML Pen Inject 5 mg into the skin once a week.     Current Facility-Administered Medications  Medication Dose Route Frequency Provider Last Rate Last Admin   cyanocobalamin  (VITAMIN B12) injection  1,000 mcg  1,000 mcg Intramuscular Once Tabori, Katherine E, MD       cyanocobalamin  (VITAMIN B12) injection 1,000 mcg  1,000 mcg Intramuscular Once Tabori, Katherine E, MD        REVIEW OF SYSTEMS:    Review of Systems - Oncology  All other pertinent systems were reviewed and were negative except as mentioned above.  PHYSICAL EXAMINATION:   Onc Performance Status - 03/09/24 1324       ECOG Perf Status   ECOG Perf Status Restricted in physically strenuous activity but ambulatory and able to carry out work of a light or sedentary nature, e.g., light house work, office work      KPS SCALE   KPS % SCORE Normal activity with effort, some s/s of disease          Vitals:   03/09/24 1303  BP: 120/88  Pulse: 80  Resp: 18  Temp: 98.8 F (37.1 C)  SpO2: 100%   Filed Weights   03/09/24 1303  Weight: 212 lb 1.6 oz (96.2 kg)    Physical Exam Constitutional:      General: She is not in acute distress.    Appearance: Normal appearance.  HENT:     Head: Normocephalic and atraumatic.  Cardiovascular:     Rate and Rhythm: Normal rate.     Heart sounds: Normal heart sounds.  Pulmonary:     Effort: Pulmonary effort is normal. No respiratory distress.     Breath sounds: Normal breath sounds.  Abdominal:     General: There is no distension.  Lymphadenopathy:     Cervical: Cervical adenopathy (Posterior cervical chain LN ~ 2 cm on right side, freely mobile, non-tender) present.     Lower Body: Right inguinal adenopathy present. Left inguinal adenopathy present.     Comments: Bilateral inguinal lymph nodes, measuring at least 1 cm each, nontender and freely mobile  Neurological:     General: No focal deficit present.     Mental Status: She is alert and oriented to person, place, and time.  Psychiatric:        Mood and Affect: Mood normal.        Behavior: Behavior normal.     LABORATORY DATA:   I have reviewed the data as listed.  Results for orders placed or performed  in visit on 03/09/24  Sedimentation rate  Result Value Ref Range   Sed Rate 41 (H) 0 - 22 mm/hr  Ferritin  Result Value Ref Range   Ferritin 27 11 - 307 ng/mL  CBC with Differential (Cancer Center Only)  Result Value Ref Range   WBC Count 6.1 4.0 - 10.5 K/uL   RBC 4.77 3.87 - 5.11 MIL/uL   Hemoglobin 10.5 (L) 12.0 - 15.0 g/dL   HCT 66.3 (L) 63.9 - 53.9 %   MCV 70.4 (L) 80.0 - 100.0 fL   MCH 22.0 (L) 26.0 - 34.0 pg   MCHC 31.3 30.0 - 36.0 g/dL   RDW 79.8 (H) 88.4 - 84.4 %   Platelet Count 521 (H) 150 - 400 K/uL   nRBC 0.0 0.0 - 0.2 %   Neutrophils Relative % 71 %   Neutro Abs 4.3 1.7 - 7.7 K/uL   Lymphocytes Relative 19 %   Lymphs Abs 1.2 0.7 - 4.0 K/uL   Monocytes Relative 9 %   Monocytes Absolute 0.5 0.1 - 1.0 K/uL   Eosinophils Relative 1 %   Eosinophils Absolute 0.1 0.0 - 0.5 K/uL   Basophils Relative 0 %   Basophils Absolute 0.0 0.0 - 0.1 K/uL   Immature Granulocytes 0 %   Abs Immature Granulocytes 0.02 0.00 - 0.07 K/uL   Lab Results  Component Value Date   IRON  24 (L) 01/28/2024   TIBC 376 01/28/2024   FERRITIN 27 03/09/2024      RADIOGRAPHIC STUDIES:  No recent pertinent imaging studies available to review.  Orders Placed This Encounter  Procedures   US  Soft Tissue Head/Neck    Standing Status:   Future    Expected Date:   03/09/2024    Expiration Date:   03/09/2025    Reason for Exam (SYMPTOM  OR DIAGNOSIS REQUIRED):   Non-specific neck lymp node enlargement. Please evaluate further    Preferred imaging location?:   [facility]   CBC with Differential (Cancer Center Only)    Standing Status:   Future    Number of Occurrences:   1    Expiration Date:   03/09/2025   Iron  and TIBC    Standing Status:   Future    Number of Occurrences:   1    Expiration Date:   03/09/2025   Ferritin    Standing Status:   Future    Number of Occurrences:   1    Expiration Date:   03/09/2025   Sedimentation rate    Standing Status:   Future    Number of  Occurrences:   1    Expiration Date:   03/09/2025   C-reactive protein    Standing Status:   Future    Number of Occurrences:   1    Expiration Date:   03/09/2025   JAK2 V617F rfx CALR/MPL/E12-15    Standing Status:   Future    Number of Occurrences:   1    Expiration Date:   03/09/2025   Flow Cytometry, Peripheral Blood (Oncology)    Scattered lymphadenopathy in neck, groin. Please obtain lymphoma panel    Standing Status:   Future    Number of Occurrences:   1    Expiration Date:   03/09/2025    Future Appointments  Date Time Provider Department Center  03/09/2024  5:30 PM [facility]  1 [facility]  [address]  03/13/2024 11:30 AM [facility]  [facility]  03/16/2024  9:30 AM Dennise Kingsley, MD [facility]     I spent a total of 65 minutes during this encounter with the patient including review of chart and various tests results, discussions about plan of care and coordination of care plan.  This document was completed utilizing speech recognition software. Grammatical errors, random word insertions, pronoun errors, and incomplete sentences are an occasional consequence of this system due to software limitations, ambient noise, and hardware issues. Any formal questions or concerns about the content, text or information contained within the body of this dictation should be directly addressed to the provider for clarification.

## 2024-03-09 NOTE — Telephone Encounter (Signed)
 Zoe Soto with [facility] called regarding RPR titer 1:256. They do not have any previous results for her.   Last RPR available in chart is non-reactive from 06/19/22.   Routing to provider for orders.   Ayelet Gruenewald, BSN, RN

## 2024-03-10 ENCOUNTER — Other Ambulatory Visit: Payer: Self-pay | Admitting: Oncology

## 2024-03-10 LAB — COMPLETE METABOLIC PANEL WITHOUT GFR
AG Ratio: 0.9 (calc) — ABNORMAL LOW (ref 1.0–2.5)
ALT: 7 U/L (ref 6–29)
AST: 14 U/L (ref 10–35)
Albumin: 3.7 g/dL (ref 3.6–5.1)
Alkaline phosphatase (APISO): 63 U/L (ref 31–125)
BUN/Creatinine Ratio: 14 (calc) (ref 6–22)
BUN: 15 mg/dL (ref 7–25)
CO2: 23 mmol/L (ref 20–32)
Calcium: 9.2 mg/dL (ref 8.6–10.2)
Chloride: 103 mmol/L (ref 98–110)
Creat: 1.05 mg/dL — ABNORMAL HIGH (ref 0.50–0.99)
Globulin: 3.9 g/dL — ABNORMAL HIGH (ref 1.9–3.7)
Glucose, Bld: 74 mg/dL (ref 65–99)
Potassium: 4.1 mmol/L (ref 3.5–5.3)
Sodium: 136 mmol/L (ref 135–146)
Total Bilirubin: 0.5 mg/dL (ref 0.2–1.2)
Total Protein: 7.6 g/dL (ref 6.1–8.1)

## 2024-03-10 LAB — CBC WITH DIFFERENTIAL/PLATELET
Absolute Lymphocytes: 899 {cells}/uL (ref 850–3900)
Absolute Monocytes: 298 {cells}/uL (ref 200–950)
Basophils Absolute: 12 {cells}/uL (ref 0–200)
Basophils Relative: 0.2 %
Eosinophils Absolute: 81 {cells}/uL (ref 15–500)
Eosinophils Relative: 1.3 %
HCT: 36.4 % (ref 35.0–45.0)
Hemoglobin: 10.6 g/dL — ABNORMAL LOW (ref 11.7–15.5)
MCH: 21.7 pg — ABNORMAL LOW (ref 27.0–33.0)
MCHC: 29.1 g/dL — ABNORMAL LOW (ref 32.0–36.0)
MCV: 74.4 fL — ABNORMAL LOW (ref 80.0–100.0)
MPV: 9.2 fL (ref 7.5–12.5)
Monocytes Relative: 4.8 %
Neutro Abs: 4910 {cells}/uL (ref 1500–7800)
Neutrophils Relative %: 79.2 %
Platelets: 603 Thousand/uL — ABNORMAL HIGH (ref 140–400)
RBC: 4.89 Million/uL (ref 3.80–5.10)
RDW: 18.1 % — ABNORMAL HIGH (ref 11.0–15.0)
Total Lymphocyte: 14.5 %
WBC: 6.2 Thousand/uL (ref 3.8–10.8)

## 2024-03-10 LAB — HEPATITIS PANEL, ACUTE
Hep A IgM: NONREACTIVE
Hep B C IgM: NONREACTIVE
Hepatitis B Surface Ag: NONREACTIVE
Hepatitis C Ab: NONREACTIVE

## 2024-03-10 LAB — EPSTEIN BARR VIRUS DNA, QUANT RTPCR
EBV DNA, QN PCR: NOT DETECTED [IU]/mL
EBV DNA, QN PCR: NOT DETECTED {Log_IU}/mL

## 2024-03-10 LAB — T PALLIDUM AB: T Pallidum Abs: POSITIVE — AB

## 2024-03-10 LAB — CMV (CYTOMEGALOVIRUS) DNA ULTRAQUANT, PCR
CMV DNA, QN PCR: NOT DETECTED [IU]/mL
CMV DNA, QN PCR: NOT DETECTED {Log_IU}/mL

## 2024-03-10 LAB — CULTURE, BLOOD (SINGLE)
MICRO NUMBER:: 17153930
Result:: NO GROWTH
SPECIMEN QUALITY:: ADEQUATE

## 2024-03-10 LAB — RPR: RPR Ser Ql: REACTIVE — AB

## 2024-03-10 LAB — RHEUMATOID FACTOR: Rheumatoid fact SerPl-aCnc: <10 [IU]/mL (ref <14)

## 2024-03-10 LAB — RPR TITER: RPR Titer: 1:256 {titer} — ABNORMAL HIGH

## 2024-03-10 LAB — C-REACTIVE PROTEIN: CRP: 29.3 mg/L — ABNORMAL HIGH (ref <8.0)

## 2024-03-10 LAB — CYCLIC CITRUL PEPTIDE ANTIBODY, IGG: Cyclic Citrullin Peptide Ab: <16 U

## 2024-03-11 ENCOUNTER — Inpatient Hospital Stay (HOSPITAL_COMMUNITY)
Admission: EM | Admit: 2024-03-11 | Discharge: 2024-03-14 | DRG: 869 | Disposition: A | Discharge status: Home / Self Care | Attending: Internal Medicine | Admitting: Internal Medicine

## 2024-03-11 ENCOUNTER — Telehealth: Payer: Self-pay | Admitting: Internal Medicine

## 2024-03-11 ENCOUNTER — Ambulatory Visit: Payer: Self-pay | Admitting: Internal Medicine

## 2024-03-11 DIAGNOSIS — M2559 Pain in other specified joint: Secondary | ICD-10-CM | POA: Diagnosis present

## 2024-03-11 DIAGNOSIS — K219 Gastro-esophageal reflux disease without esophagitis: Secondary | ICD-10-CM | POA: Diagnosis present

## 2024-03-11 DIAGNOSIS — Z9884 Bariatric surgery status: Secondary | ICD-10-CM

## 2024-03-11 DIAGNOSIS — Z9109 Other allergy status, other than to drugs and biological substances: Secondary | ICD-10-CM

## 2024-03-11 DIAGNOSIS — E66811 Obesity, class 1: Secondary | ICD-10-CM | POA: Diagnosis present

## 2024-03-11 DIAGNOSIS — F419 Anxiety disorder, unspecified: Secondary | ICD-10-CM | POA: Diagnosis present

## 2024-03-11 DIAGNOSIS — Z7951 Long term (current) use of inhaled steroids: Secondary | ICD-10-CM

## 2024-03-11 DIAGNOSIS — J4540 Moderate persistent asthma, uncomplicated: Secondary | ICD-10-CM | POA: Diagnosis present

## 2024-03-11 DIAGNOSIS — Z6833 Body mass index (BMI) 33.0-33.9, adult: Secondary | ICD-10-CM

## 2024-03-11 DIAGNOSIS — Z8701 Personal history of pneumonia (recurrent): Secondary | ICD-10-CM

## 2024-03-11 DIAGNOSIS — Z8249 Family history of ischemic heart disease and other diseases of the circulatory system: Secondary | ICD-10-CM

## 2024-03-11 DIAGNOSIS — F32A Depression, unspecified: Secondary | ICD-10-CM | POA: Diagnosis present

## 2024-03-11 DIAGNOSIS — M2550 Pain in unspecified joint: Secondary | ICD-10-CM

## 2024-03-11 DIAGNOSIS — Z91030 Bee allergy status: Secondary | ICD-10-CM

## 2024-03-11 DIAGNOSIS — D509 Iron deficiency anemia, unspecified: Secondary | ICD-10-CM | POA: Diagnosis present

## 2024-03-11 DIAGNOSIS — A530 Latent syphilis, unspecified as early or late: Principal | ICD-10-CM | POA: Diagnosis present

## 2024-03-11 DIAGNOSIS — H43399 Other vitreous opacities, unspecified eye: Secondary | ICD-10-CM | POA: Diagnosis present

## 2024-03-11 DIAGNOSIS — Z888 Allergy status to other drugs, medicaments and biological substances status: Secondary | ICD-10-CM

## 2024-03-11 DIAGNOSIS — Z79899 Other long term (current) drug therapy: Secondary | ICD-10-CM

## 2024-03-11 DIAGNOSIS — D75839 Thrombocytosis, unspecified: Secondary | ICD-10-CM | POA: Diagnosis present

## 2024-03-11 DIAGNOSIS — Z825 Family history of asthma and other chronic lower respiratory diseases: Secondary | ICD-10-CM

## 2024-03-11 DIAGNOSIS — Z87891 Personal history of nicotine dependence: Secondary | ICD-10-CM

## 2024-03-11 DIAGNOSIS — Z8619 Personal history of other infectious and parasitic diseases: Secondary | ICD-10-CM

## 2024-03-11 DIAGNOSIS — Z886 Allergy status to analgesic agent status: Secondary | ICD-10-CM

## 2024-03-11 DIAGNOSIS — Z91038 Other insect allergy status: Secondary | ICD-10-CM

## 2024-03-11 DIAGNOSIS — H5319 Other subjective visual disturbances: Secondary | ICD-10-CM | POA: Diagnosis present

## 2024-03-11 DIAGNOSIS — Z5329 Procedure and treatment not carried out because of patient's decision for other reasons: Secondary | ICD-10-CM | POA: Diagnosis present

## 2024-03-11 LAB — COMPREHENSIVE METABOLIC PANEL WITH GFR
ALT: 8 U/L (ref 0–44)
AST: 17 U/L (ref 15–41)
Albumin: 3.8 g/dL (ref 3.5–5.0)
Alkaline Phosphatase: 60 U/L (ref 38–126)
Anion gap: 10 (ref 5–15)
BUN: 13 mg/dL (ref 6–20)
CO2: 22 mmol/L (ref 22–32)
Calcium: 9.5 mg/dL (ref 8.9–10.3)
Chloride: 106 mmol/L (ref 98–111)
Creatinine, Ser: 1.12 mg/dL — ABNORMAL HIGH (ref 0.44–1.00)
GFR, Estimated: >60 mL/min (ref >60)
Glucose, Bld: 85 mg/dL (ref 70–99)
Potassium: 4 mmol/L (ref 3.5–5.1)
Sodium: 138 mmol/L (ref 135–145)
Total Bilirubin: 0.4 mg/dL (ref 0.0–1.2)
Total Protein: 8.2 g/dL — ABNORMAL HIGH (ref 6.5–8.1)

## 2024-03-11 LAB — URINALYSIS, ROUTINE W REFLEX MICROSCOPIC
Bacteria, UA: NONE SEEN
Bilirubin Urine: NEGATIVE
Glucose, UA: NEGATIVE mg/dL
Ketones, ur: NEGATIVE mg/dL
Nitrite: NEGATIVE
Protein, ur: >=300 mg/dL — AB
RBC / HPF: >50 RBC/hpf (ref 0–5)
Specific Gravity, Urine: 1.02 (ref 1.005–1.030)
WBC, UA: >50 WBC/hpf (ref 0–5)
pH: 5 (ref 5.0–8.0)

## 2024-03-11 LAB — CBC WITH DIFFERENTIAL/PLATELET
Abs Immature Granulocytes: 0.01 K/uL (ref 0.00–0.07)
Basophils Absolute: 0 K/uL (ref 0.0–0.1)
Basophils Relative: 1 %
Eosinophils Absolute: 0.1 K/uL (ref 0.0–0.5)
Eosinophils Relative: 1 %
HCT: 33.3 % — ABNORMAL LOW (ref 36.0–46.0)
Hemoglobin: 10 g/dL — ABNORMAL LOW (ref 12.0–15.0)
Immature Granulocytes: 0 %
Lymphocytes Relative: 24 %
Lymphs Abs: 1.3 K/uL (ref 0.7–4.0)
MCH: 21.6 pg — ABNORMAL LOW (ref 26.0–34.0)
MCHC: 30 g/dL (ref 30.0–36.0)
MCV: 71.8 fL — ABNORMAL LOW (ref 80.0–100.0)
Monocytes Absolute: 0.5 K/uL (ref 0.1–1.0)
Monocytes Relative: 9 %
Neutro Abs: 3.7 K/uL (ref 1.7–7.7)
Neutrophils Relative %: 65 %
Platelets: 584 K/uL — ABNORMAL HIGH (ref 150–400)
RBC: 4.64 MIL/uL (ref 3.87–5.11)
RDW: 20.9 % — ABNORMAL HIGH (ref 11.5–15.5)
WBC: 5.6 K/uL (ref 4.0–10.5)
nRBC: 0 % (ref 0.0–0.2)

## 2024-03-11 NOTE — Progress Notes (Signed)
 Called pt and had an extensive conversation about going to the ED for lp for ocncern fo rneurosyphiliss. I communicated with neuro as well, lp not done in office.

## 2024-03-11 NOTE — ED Provider Triage Note (Signed)
 Emergency Medicine Provider Triage Evaluation Note  Zoe Soto , a 43 y.o. female  was evaluated in triage.  Pt complains of being sent over by ID specialist for LP. Note in chart states that ID physician recommends LP and possibly MRI of brain. Patient has 2-3 month hx of fever, chills, along with fatigue and weakness. Also mention of increasing migraines, floaters, blurry vision. Also recently diagnosed with syphilis.   Review of Systems  Positive: Fever, chills, fatigue, migraines, blurry vision, rash Negative: Current pain, chest pain, shortness of breath, abdominal pain  Physical Exam  BP (!) 127/95 (BP Location: Left Arm)   Pulse 83   Temp 98.7 F (37.1 C) (Oral)   Resp 18   SpO2 100%  Gen:   Awake, no distress   Resp:  Normal effort  MSK:   Moves extremities without difficulty  Other:  Rash present to trunk and abdomen   Medical Decision Making  Medically screening exam initiated at 7:31 PM.  Appropriate orders placed.  Zoe Soto was informed that the remainder of the evaluation will be completed by another provider, this initial triage assessment does not replace that evaluation, and the importance of remaining in the ED until their evaluation is complete.  Orders: CBC, CMP, UA, G/C testing, wet-prep   Zoe Terrall FALCON, PA-C 03/11/24 1934

## 2024-03-11 NOTE — ED Triage Notes (Signed)
 Patient states she was sent here to ED by Dr. Dennise, Infectious Disease MD for a LP. Patient states she has been having issues and sick for past 3 months. Rash, fever, sore throats, anemia for past 2-3 months. Patient denies pain. Patient states she was diagnosed Syphilis this am and she has rash to trunk.

## 2024-03-11 NOTE — Telephone Encounter (Signed)
 Called pt in regards to RPR titer 1:256 in the setting of worsening migraines, floaters with blurry vision. Counseled ot go to ED for LP to look evaluated for neurosyphilis and then start IV penicillin. Can get MRI brain at the time as well.

## 2024-03-12 ENCOUNTER — Emergency Department (HOSPITAL_COMMUNITY)

## 2024-03-12 ENCOUNTER — Encounter (HOSPITAL_COMMUNITY): Payer: Self-pay | Admitting: Internal Medicine

## 2024-03-12 ENCOUNTER — Inpatient Hospital Stay (HOSPITAL_COMMUNITY)

## 2024-03-12 DIAGNOSIS — F32A Depression, unspecified: Secondary | ICD-10-CM

## 2024-03-12 DIAGNOSIS — A5149 Other secondary syphilitic conditions: Secondary | ICD-10-CM | POA: Diagnosis not present

## 2024-03-12 DIAGNOSIS — Z9109 Other allergy status, other than to drugs and biological substances: Secondary | ICD-10-CM | POA: Diagnosis not present

## 2024-03-12 DIAGNOSIS — H5319 Other subjective visual disturbances: Secondary | ICD-10-CM | POA: Diagnosis present

## 2024-03-12 DIAGNOSIS — M2550 Pain in unspecified joint: Secondary | ICD-10-CM | POA: Diagnosis not present

## 2024-03-12 DIAGNOSIS — E66811 Obesity, class 1: Secondary | ICD-10-CM | POA: Diagnosis present

## 2024-03-12 DIAGNOSIS — Z825 Family history of asthma and other chronic lower respiratory diseases: Secondary | ICD-10-CM | POA: Diagnosis not present

## 2024-03-12 DIAGNOSIS — F419 Anxiety disorder, unspecified: Secondary | ICD-10-CM | POA: Diagnosis present

## 2024-03-12 DIAGNOSIS — Z8701 Personal history of pneumonia (recurrent): Secondary | ICD-10-CM | POA: Diagnosis not present

## 2024-03-12 DIAGNOSIS — Z87891 Personal history of nicotine dependence: Secondary | ICD-10-CM | POA: Diagnosis not present

## 2024-03-12 DIAGNOSIS — M2559 Pain in other specified joint: Secondary | ICD-10-CM | POA: Diagnosis present

## 2024-03-12 DIAGNOSIS — Z5329 Procedure and treatment not carried out because of patient's decision for other reasons: Secondary | ICD-10-CM | POA: Diagnosis present

## 2024-03-12 DIAGNOSIS — Z8249 Family history of ischemic heart disease and other diseases of the circulatory system: Secondary | ICD-10-CM | POA: Diagnosis not present

## 2024-03-12 DIAGNOSIS — K219 Gastro-esophageal reflux disease without esophagitis: Secondary | ICD-10-CM | POA: Diagnosis present

## 2024-03-12 DIAGNOSIS — Z7951 Long term (current) use of inhaled steroids: Secondary | ICD-10-CM | POA: Diagnosis not present

## 2024-03-12 DIAGNOSIS — R591 Generalized enlarged lymph nodes: Secondary | ICD-10-CM | POA: Diagnosis not present

## 2024-03-12 DIAGNOSIS — J4540 Moderate persistent asthma, uncomplicated: Secondary | ICD-10-CM | POA: Diagnosis present

## 2024-03-12 DIAGNOSIS — A530 Latent syphilis, unspecified as early or late: Secondary | ICD-10-CM | POA: Diagnosis present

## 2024-03-12 DIAGNOSIS — M549 Dorsalgia, unspecified: Secondary | ICD-10-CM | POA: Diagnosis not present

## 2024-03-12 DIAGNOSIS — Z6833 Body mass index (BMI) 33.0-33.9, adult: Secondary | ICD-10-CM | POA: Diagnosis not present

## 2024-03-12 DIAGNOSIS — Z888 Allergy status to other drugs, medicaments and biological substances status: Secondary | ICD-10-CM | POA: Diagnosis not present

## 2024-03-12 DIAGNOSIS — Z79899 Other long term (current) drug therapy: Secondary | ICD-10-CM | POA: Diagnosis not present

## 2024-03-12 DIAGNOSIS — D509 Iron deficiency anemia, unspecified: Secondary | ICD-10-CM | POA: Diagnosis present

## 2024-03-12 DIAGNOSIS — D75839 Thrombocytosis, unspecified: Secondary | ICD-10-CM | POA: Diagnosis present

## 2024-03-12 DIAGNOSIS — Z8619 Personal history of other infectious and parasitic diseases: Secondary | ICD-10-CM | POA: Diagnosis not present

## 2024-03-12 DIAGNOSIS — Z0389 Encounter for observation for other suspected diseases and conditions ruled out: Secondary | ICD-10-CM | POA: Diagnosis not present

## 2024-03-12 DIAGNOSIS — Z9884 Bariatric surgery status: Secondary | ICD-10-CM | POA: Diagnosis not present

## 2024-03-12 DIAGNOSIS — Z91030 Bee allergy status: Secondary | ICD-10-CM | POA: Diagnosis not present

## 2024-03-12 DIAGNOSIS — Z886 Allergy status to analgesic agent status: Secondary | ICD-10-CM | POA: Diagnosis not present

## 2024-03-12 LAB — CBC WITH DIFFERENTIAL/PLATELET
Abs Immature Granulocytes: 0.01 K/uL (ref 0.00–0.07)
Basophils Absolute: 0 K/uL (ref 0.0–0.1)
Basophils Relative: 0 %
Eosinophils Absolute: 0 K/uL (ref 0.0–0.5)
Eosinophils Relative: 0 %
HCT: 30.7 % — ABNORMAL LOW (ref 36.0–46.0)
Hemoglobin: 9.5 g/dL — ABNORMAL LOW (ref 12.0–15.0)
Immature Granulocytes: 0 %
Lymphocytes Relative: 12 %
Lymphs Abs: 0.7 K/uL (ref 0.7–4.0)
MCH: 22.2 pg — ABNORMAL LOW (ref 26.0–34.0)
MCHC: 30.9 g/dL (ref 30.0–36.0)
MCV: 71.7 fL — ABNORMAL LOW (ref 80.0–100.0)
Monocytes Absolute: 0.4 K/uL (ref 0.1–1.0)
Monocytes Relative: 7 %
Neutro Abs: 4.8 K/uL (ref 1.7–7.7)
Neutrophils Relative %: 81 %
Platelets: 466 K/uL — ABNORMAL HIGH (ref 150–400)
RBC: 4.28 MIL/uL (ref 3.87–5.11)
RDW: 20.4 % — ABNORMAL HIGH (ref 11.5–15.5)
WBC: 6 K/uL (ref 4.0–10.5)
nRBC: 0 % (ref 0.0–0.2)

## 2024-03-12 LAB — MENINGITIS/ENCEPHALITIS PANEL (CSF)

## 2024-03-12 LAB — CSF CELL COUNT WITH DIFFERENTIAL
Eosinophils, CSF: 0 % (ref 0–1)
Eosinophils, CSF: 0 % (ref 0–1)
Lymphs, CSF: 75 % (ref 40–80)
Lymphs, CSF: 73 % (ref 40–80)
Monocyte-Macrophage-Spinal Fluid: 10 % — ABNORMAL LOW (ref 15–45)
Monocyte-Macrophage-Spinal Fluid: 16 % (ref 15–45)
RBC Count, CSF: 48 /mm3 — ABNORMAL HIGH
RBC Count, CSF: 3 /mm3 — ABNORMAL HIGH
Segmented Neutrophils-CSF: 15 % — ABNORMAL HIGH (ref 0–6)
Segmented Neutrophils-CSF: 11 % — ABNORMAL HIGH (ref 0–6)
Tube #: 1
Tube #: 4
WBC, CSF: 17 /mm3 (ref 0–5)
WBC, CSF: 18 /mm3 (ref 0–5)

## 2024-03-12 LAB — BASIC METABOLIC PANEL WITH GFR
Anion gap: 9 (ref 5–15)
BUN: 11 mg/dL (ref 6–20)
CO2: 22 mmol/L (ref 22–32)
Calcium: 9 mg/dL (ref 8.9–10.3)
Chloride: 109 mmol/L (ref 98–111)
Creatinine, Ser: 1 mg/dL (ref 0.44–1.00)
GFR, Estimated: >60 mL/min (ref >60)
Glucose, Bld: 95 mg/dL (ref 70–99)
Potassium: 4.1 mmol/L (ref 3.5–5.1)
Sodium: 140 mmol/L (ref 135–145)

## 2024-03-12 LAB — HEPATIC FUNCTION PANEL
ALT: <5 U/L (ref 0–44)
AST: 21 U/L (ref 15–41)
Albumin: 3.4 g/dL — ABNORMAL LOW (ref 3.5–5.0)
Alkaline Phosphatase: 54 U/L (ref 38–126)
Bilirubin, Direct: 0.1 mg/dL (ref 0.0–0.2)
Indirect Bilirubin: 0.3 mg/dL (ref 0.3–0.9)
Total Bilirubin: 0.4 mg/dL (ref 0.0–1.2)
Total Protein: 7.5 g/dL (ref 6.5–8.1)

## 2024-03-12 LAB — PROTEIN AND GLUCOSE, CSF
Glucose, CSF: 55 mg/dL (ref 40–70)
Total  Protein, CSF: 60 mg/dL — ABNORMAL HIGH (ref 15–45)

## 2024-03-12 LAB — SURGICAL PATHOLOGY

## 2024-03-12 MED ORDER — HYDROMORPHONE HCL 1 MG/ML IJ SOLN
0.5000 mg | Freq: Once | INTRAMUSCULAR | Status: AC
  Administered 2024-03-12: 0.5 mg via INTRAVENOUS
  Filled 2024-03-12: qty 1

## 2024-03-12 MED ORDER — FLUOXETINE HCL 20 MG PO CAPS
20.0000 mg | ORAL_CAPSULE | Freq: Every day | ORAL | Status: DC
  Administered 2024-03-12 – 2024-03-14 (×3): 20 mg via ORAL
  Filled 2024-03-12 (×3): qty 1

## 2024-03-12 MED ORDER — GADOBUTROL 1 MMOL/ML IV SOLN
10.0000 mL | Freq: Once | INTRAVENOUS | Status: AC | PRN
  Administered 2024-03-12: 10 mL via INTRAVENOUS

## 2024-03-12 MED ORDER — LACTATED RINGERS IV SOLN: INTRAVENOUS | Status: AC

## 2024-03-12 MED ORDER — LORAZEPAM 2 MG/ML IJ SOLN
1.0000 mg | Freq: Once | INTRAMUSCULAR | Status: AC
  Administered 2024-03-12: 1 mg via INTRAVENOUS

## 2024-03-12 MED ORDER — LIDOCAINE-EPINEPHRINE 2 %-1:100000 IJ SOLN
20.0000 mL | Freq: Once | INTRAMUSCULAR | Status: AC
  Administered 2024-03-12: 20 mL via INTRADERMAL
  Filled 2024-03-12: qty 1

## 2024-03-12 MED ORDER — ACETAMINOPHEN 325 MG PO TABS
650.0000 mg | ORAL_TABLET | Freq: Four times a day (QID) | ORAL | Status: DC | PRN
Start: 2024-03-12 — End: 2024-03-14
  Administered 2024-03-12 – 2024-03-13 (×3): 650 mg via ORAL
  Filled 2024-03-12 (×4): qty 2

## 2024-03-12 MED ORDER — PENICILLIN G POTASSIUM 20000000 UNITS IJ SOLR
24.0000 10*6.[IU] | INTRAVENOUS | Status: DC
  Administered 2024-03-12 – 2024-03-13 (×2): 24 10*6.[IU] via INTRAVENOUS
  Filled 2024-03-12 (×2): qty 24
  Filled 2024-03-12: qty 20

## 2024-03-12 MED ORDER — ACETAMINOPHEN 650 MG RE SUPP: 650.0000 mg | Freq: Four times a day (QID) | RECTAL | Status: DC | PRN

## 2024-03-12 MED ORDER — TRAMADOL HCL 50 MG PO TABS
50.0000 mg | ORAL_TABLET | Freq: Once | ORAL | Status: AC
  Administered 2024-03-13: 50 mg via ORAL
  Filled 2024-03-12: qty 1

## 2024-03-12 NOTE — Assessment & Plan Note (Addendum)
 Concerning for syphilis particularly neurosyphilis given the visual changes   Infectious disease consultant Dr. Montie Cleverly was consulted by ER physician and Dr. Dea is consulting now   Unsuccessful LP attempt in the ER, repeat under fluoroscopy today, labs pending MRI negative Patient reports an eye exam about 2 weeks ago but will need another exam as inpatient given new information (diagnosis of syphilis) Started on PCN G for empiric coverage of neurosyphilis

## 2024-03-12 NOTE — Hospital Course (Addendum)
 43yo with h/o depression who presented from ID clinic with RPR 1:256 and + for FTA-ABS.  CMV titers were also recently positive.  She has had neurologic symptoms and a rash, concerning for neurosyphilis.  LP was attempted without success; IR LP ordered.  MRI normal.  ID consulted.

## 2024-03-12 NOTE — Assessment & Plan Note (Addendum)
 Body mass index is 33.31 kg/m.SABRA  Weight loss should be encouraged Outpatient PCP/bariatric medicine f/u encouraged Significantly low or high BMI is associated with higher medical risk including morbidity and mortality

## 2024-03-12 NOTE — Consult Note (Signed)
 [facility]                                                                                       Patient Identification: Patient Name: Zoe Soto MRN: [id number] Admit Date: 03/11/2024  6:34 PM Today's Date: 03/12/2024 Reason for consult: possible Neurosyphilis  Requesting provider: Dr Barbarann   Principal Problem:   Polyarthralgia Active Problems:   Iron  deficiency anemia   Depressive disorder   Thrombocytosis   RPR positive   Class 1 obesity due to excess calories with body mass index (BMI) of 33.0 to 33.9 in adult   Antibiotics:  None   Lines/Hardware:  ID labs 10/27 T. pallidum antibodies positive, RPR positive, 1: 256 10/27 CMV DNA negative 10/27 EBV DNA negative 10/27 acute hepatitis panel negative 10/9 HIV negative 10/9 CMV IgM 62.1, CMV antibody IgG 4.3 10/9 EBV VCA IgG greater than 750, EBV VCA IgM less than 36, EBV NA IgG greater than 600 10/9 influenza A/influenza B/mono screen/RSV/SARS-CoV-2 negative  10/9 urine culture 2000 colonies per mL strep agalactiae  Assessment # Possible Neurosyphilis, Ocular syphilis # Secondary syphilis - 11/6 fluoroscopy-guided LP today, RBC 48, WBC 17, lymphocytic, glucose 55, protein 10, Gram stain prelim no organisms predominantly mononuclear  # B/L cervical lymphadenopathy - likely related to above   # Polyarthralgia - concerns for gonococcal infection   # STD screening  - as above   Recommendations  - Patient has undergone LP and was started on penicillin G for possible neurosyphilis.  - Pending studies to follow meningitis/encephalitis panel, CSF Gram stain and culture, Lyme DNA, JC virus PCR, VZV PCR, CSF VDRL - I have also asked microbiology lab to run CSF FTA-ABS - Follow-up urine GC - Would also get ophthalmologic eye exam  - She can be followed up in clinic for PrEP when discharged  - Universal/standard isolation  precaution Dr Eben covering on 11/7. Dr Luiz to follow over the weekend.   Rest of the management as per the primary team. Please call with questions or concerns.  Thank you for the consult  __________________________________________________________________________________________________________ HPI and Hospital Course: 43 year old female with prior medical history as below including anxiety, HSV who presented to the ED on 11/5 for concerns of neurosyphilis per recommendation by Dr. Dennise.   Patient not feeling well for the last 3 months with intermittent fevers, rash, sore throat, joint pains after returning from a cruise to [place] in August. Symptoms worse in the last couple of weeks with subjective fever, chills, headaches, body aches including visual floaters/flashes of light. She thought her migrane had come back when headache have started.   She was seen in the ED on 10/9 for hives, rashes, sore throat with unremarkable workup  Fevers have improved but had persistent generalized body ache, joint pains migrating from her knees to ankles to hands to shoulders for which she took Tylenol , ibuprofen almost every other day.    She is also being followed by oncologist for anemia and thrombocytosis.  She was recently treated for Candida vaginitis with fluconazole .  She reports being seen by ophthalmologist approximately 2 weeks ago and thought to have  ocular migraine She complains of needing to a tube in her right ear  Husband has died and she is sexually active with her boyfriend who is aware about her syphilis diagnosis.  Denies any known prior STI.   Denies nausea, vomiting, diarrhea, abdominal pain, chest pain, shortness of breath or sinus symptoms.  No GU symptoms, vaginal discharge  At ED Afebrile Labs remarkable for creatinine elevated to 1.12, WBC 5.6, hemoglobin 10, platelets 584  UA hazy, large hemoglobin, moderate leukocytes, more than 300 protein, no bacteria, RBC more  than 50, WBC more than 50  LP was attempted but unsuccessful as patient was unable to tolerate the procedure.  US  IMPRESSION: 1. Non-malignant but reactive-appearing bilateral cervical lymph nodes. Recommended follow-up by clinical exam, with neck CT (IV contrast preferred) if any area enlarges or becomes painful.    ROS: General- Denies fever, chills, loss of appetite and loss of weight HEENT - Denies blurry vision, neck pain, sinus pain Chest - Denies any chest pain, SOB or cough CVS- Denies any dizziness/lightheadedness, syncopal attacks, palpitations Abdomen- Denies any nausea, vomiting, abdominal pain, hematochezia and diarrhea Neuro - Denies any weakness, numbness, tingling sensation Psych - Denies any changes in mood irritability or depressive symptoms GU- Denies any burning, dysuria, hematuria or increased frequency of urination Skin -fading rashes in the abdomen and back  MSK - denies any joint pain/swelling or restricted ROM   Past Medical History:  Diagnosis Date   Allergy    Seasonal   Anxiety    Asthma    Bee sting allergy    GERD (gastroesophageal reflux disease)    Herpes simplex virus (HSV) infection    History of chickenpox    Pneumonia 08/2017   RESOLVED   Past Surgical History:  Procedure Laterality Date   ABDOMINOPLASTY     ANKLE SURGERY Right 2011   DILITATION & CURRETTAGE/HYSTROSCOPY WITH NOVASURE ABLATION N/A 06/27/2018   Procedure: DILATATION & CURETTAGE/HYSTEROSCOPY WITH NOVASURE ABLATION;  Surgeon: Marne Kelly Nest, MD;  Location: [facility];  Service: Gynecology;  Laterality: N/A;   GASTRIC BYPASS  07/2015   GASTRIC BYPASS     KNEE ARTHROSCOPY Left 2010   LAPAROSCOPIC TUBAL LIGATION Bilateral 06/27/2018   Procedure: LAPAROSCOPIC TUBAL LIGATION;  Surgeon: Marne Kelly Nest, MD;  Location: [facility];  Service: Gynecology;  Laterality: Bilateral;   SMALL BOWEL REPAIR N/A 06/27/2018   Procedure: SMALL  BOWEL REPAIR;  Surgeon: Marne Kelly Nest, MD;  Location: [facility];  Service: Gynecology;  Laterality: N/A;    Scheduled Meds:  FLUoxetine   20 mg Oral Daily   Continuous Infusions:  lactated ringers  75 mL/hr at 03/12/24 0703   PRN Meds:.acetaminophen  **OR** acetaminophen   Allergies  Allergen Reactions   Wasp Venom Anaphylaxis and Hives   Naproxen Other (See Comments)    Hematuria when taken in large doses   Beeswax    Other     Ants   Lyrica  [Pregabalin ] Rash   Tape Rash    ADHESIVE AND PAPER TAPE REMOVES SKIN   Social History   Socioeconomic History   Marital status: Widowed    Spouse name: Not on file   Number of children: 2   Years of education: 16   Highest education level: Bachelor's degree (e.g., BA, AB, BS)  Occupational History   Occupation: Administration  Tobacco Use   Smoking status: Former   Smokeless tobacco: Never  Vaping Use   Vaping status: Never Used  Substance and Sexual Activity  Alcohol use: Yes    Alcohol/week: 4.0 standard drinks of alcohol    Types: 4 Glasses of wine per week    Comment: 4 glasses of wine per week    Drug use: No   Sexual activity: Not Currently    Birth control/protection: None  Other Topics Concern   Not on file  Social History Narrative   Right handed   Social Drivers of Health   Financial Resource Strain: Low Risk  (02/17/2024)   Overall Financial Resource Strain (CARDIA)    Difficulty of Paying Living Expenses: Not very hard  Food Insecurity: No Food Insecurity (03/09/2024)   Hunger Vital Sign    Worried About Running Out of Food in the Last Year: Never true    Ran Out of Food in the Last Year: Never true  Transportation Needs: No Transportation Needs (03/09/2024)   PRAPARE - Administrator, Civil Service (Medical): No    Lack of Transportation (Non-Medical): No  Physical Activity: Inactive (02/17/2024)   Exercise Vital Sign    Days of Exercise per Week: 0 days    Minutes of  Exercise per Session: Not on file  Stress: No Stress Concern Present (02/17/2024)   [employer] of Occupational Health - Occupational Stress Questionnaire    Feeling of Stress: Not at all  Recent Concern: Stress - Stress Concern Present (12/17/2023)   [employer] of Occupational Health - Occupational Stress Questionnaire    Feeling of Stress: To some extent  Social Connections: Unknown (02/17/2024)   Social Connection and Isolation Panel    Frequency of Communication with Friends and Family: More than three times a week    Frequency of Social Gatherings with Friends and Family: More than three times a week    Attends Religious Services: More than 4 times per year    Active Member of [employer] or Organizations: Patient declined    Attends Banker Meetings: Not on file    Marital Status: Widowed  Recent Concern: Social Connections - Moderately Isolated (12/17/2023)   Social Connection and Isolation Panel    Frequency of Communication with Friends and Family: More than three times a week    Frequency of Social Gatherings with Friends and Family: Once a week    Attends Religious Services: More than 4 times per year    Active Member of [employer] or Organizations: No    Attends Banker Meetings: Not on file    Marital Status: Widowed  Intimate Partner Violence: Not At Risk (03/09/2024)   Humiliation, Afraid, Rape, and Kick questionnaire    Fear of Current or Ex-Partner: No    Emotionally Abused: No    Physically Abused: No    Sexually Abused: No   Family History  Problem Relation Age of Onset   Alcohol abuse Mother    Heart attack Father 69   Heart disease Father    Asthma Brother    Diabetes Brother    Pancreatic cancer Maternal Grandmother    Diabetes Maternal Aunt    Congenital heart disease Son    Esophageal cancer Maternal Uncle    Esophageal cancer Maternal Uncle    Colon cancer Neg Hx    Stomach cancer Neg Hx    Vitals BP 113/78 (BP Location:  Right Arm)   Pulse 71   Temp 98.3 F (36.8 C) (Oral)   Resp 18   Ht 5' 6 (1.676 m)   Wt 93.6 kg   SpO2 100%   BMI  33.31 kg/m    Physical Exam Constitutional: Adult female lying in the bed, not in acute distress    Comments: HEENT WNL.  No conjunctivitis or ocular discharge.  Bilateral palpable cervical lymph nodes  Cardiovascular:     Rate and Rhythm: Normal rate and regular rhythm.     Heart sounds: S1 and S2  Pulmonary:     Effort: Pulmonary effort is normal.     Comments: Normal breath sounds  Abdominal:     Palpations: Abdomen is soft.     Tenderness: Distended and nontender  Musculoskeletal:        General: No swelling or tenderness in peripheral joints  Skin:    Comments: Fading macular rashes in the abdomen and back  Neurological:     General: Awake, alert and oriented, grossly nonfocal.   Psychiatric:        Mood and Affect: Mood normal.    Pertinent Microbiology Results for orders placed or performed in visit on 03/02/24  Culture, blood (single) w Reflex to ID Panel     Status: None   Collection Time: 03/02/24 10:29 AM   Specimen: Blood  Result Value Ref Range Status   MICRO NUMBER: [id number]  Final   SPECIMEN QUALITY: Adequate  Final   Source BLOOD 1  Final   STATUS: FINAL  Final   Result: No growth after 5 days  Final   COMMENT: Aerobic and anaerobic bottle received.  Final  Culture, blood (single) w Reflex to ID Panel     Status: None   Collection Time: 03/02/24 10:35 AM   Specimen: Blood  Result Value Ref Range Status   MICRO NUMBER: [id number]  Final   SPECIMEN QUALITY: Adequate  Final   Source BLOOD 2  Final   STATUS: FINAL  Final   Result: No growth after 5 days  Final   COMMENT: Aerobic and anaerobic bottle received.  Final   Pertinent Lab seen by me:    Latest Ref Rng & Units 03/12/2024    7:02 AM 03/11/2024    7:39 PM 03/09/2024    2:16 PM  CBC  WBC 4.0 - 10.5 K/uL 6.0  5.6  6.1   Hemoglobin 12.0 - 15.0 g/dL 9.5  89.9  89.4    Hematocrit 36.0 - 46.0 % 30.7  33.3  33.6   Platelets 150 - 400 K/uL 466  584  521       Latest Ref Rng & Units 03/12/2024    7:02 AM 03/11/2024    7:39 PM 03/02/2024   10:29 AM  CMP  Glucose 70 - 99 mg/dL 95  85  74   BUN 6 - 20 mg/dL 11  13  15    Creatinine 0.44 - 1.00 mg/dL 8.99  8.87  8.94   Sodium 135 - 145 mmol/L 140  138  136   Potassium 3.5 - 5.1 mmol/L 4.1  4.0  4.1   Chloride 98 - 111 mmol/L 109  106  103   CO2 22 - 32 mmol/L 22  22  23    Calcium 8.9 - 10.3 mg/dL 9.0  9.5  9.2   Total Protein 6.5 - 8.1 g/dL 7.5  8.2  7.6   Total Bilirubin 0.0 - 1.2 mg/dL 0.4  0.4  0.5   Alkaline Phos 38 - 126 U/L 54  60    AST 15 - 41 U/L 21  17  14    ALT 0 - 44 U/L <5  8  7  Pertinent Imagings/Other Imagings Plain films and CT images have been personally visualized and interpreted; radiology reports have been reviewed. Decision making incorporated into the Impression / Recommendations.  DG Lumbar Puncture Fluoro Guide Result Date: 03/12/2024 EXAM: FLUOROSCOPIC GUIDED LUMBAR PUNCTURE 03/12/2024 12:17:00 PM FLUOROSCOPY DOSE AND TYPE: Radiation Dose Index: 16.7 mGy COMPARISON: None available CLINICAL HISTORY: Back pain. PROCEDURE: OPERATOR: Informed consent was obtained after the risks and benefits of the procedure were discussed with the patient and all questions were answered fully. Universal protocol was observed and a standard timeout was performed. The patient was positioned prone and the back was prepped and draped in the normal sterile fashion. 1% lidocaine  was used for local anesthesia. The subarachnoid space was accessed with a 22-gauge 3.5 spinal needle at the L4-5 level under fluoroscopic guidance . Free flow of clear CSF was noted. Approximately 12 ml of CSF was removed and sent for analysis. The stylet was reinserted, spinal needle was removed and brief pressure was applied at the puncture site. There were no immediate complications and the patient tolerated the procedure well.  IMPRESSION: 1. Successful fluoroscopic-guided lumbar puncture. Electronically signed by: Lynwood Seip MD 03/12/2024 12:38 PM EST RP Workstation: [id number]   US  Soft Tissue Head/Neck Result Date: 03/12/2024 EXAM: US  Neck Nonvascular Soft Tissue Ultrasound 03/09/2024 06:26:30 PM TECHNIQUE: Real-time ultrasound scan of the neck with image documentation. COMPARISON: Head CT and brain MRI 03/12/2024, reported separately. CLINICAL HISTORY: 43 year old female. Palpable abnormality along the bilateral neck and groin. FINDINGS: SOFT TISSUES: In general, the background fibromuscular architecture is normal. Bilateral cervical lymph nodes are identified which are subcentimeter short axis and have maintained normal fatty hilum architecture. The largest nodes bilaterally are up to 6 mm short axis, and 1.5 to 3 cm long axis. Some of the nodes do appear mildly hypervascular on Doppler (image 7 on the right, image 18 on the left). No other mass or fluid collection. IMPRESSION: 1. Non-malignant but reactive-appearing bilateral cervical lymph nodes. Recommended follow-up by clinical exam, with neck CT (IV contrast preferred) if any area enlarges or becomes painful. Electronically signed by: Helayne Hurst MD 03/12/2024 07:01 AM EST RP Workstation: [id number]   MR Brain W and Wo Contrast Result Date: 03/12/2024 EXAM: MRI BRAIN WITH AND WITHOUT CONTRAST 03/12/2024 06:49:10 AM TECHNIQUE: Multiplanar multisequence MRI of the head/brain was performed with and without the administration of intravenous contrast. 10 mL of gadobutrol (GADAVIST) 1 MMOL/ML injection was administered. COMPARISON: CT of the head 03/12/2024 reported separately. CLINICAL HISTORY: 42 year old female. Concern for neurosyphilis. FINDINGS: BRAIN AND VENTRICLES: Brain volume is within normal limits. The major dural venous sinuses are enhancing and appear to be patent. No abnormal intracranial enhancement. No dural thickening. No cerebral edema. Elnor and white matter  signal is within normal limits for age ;minimal nonspecific white matter signal changes . No cortical encephalomalacia or chronic cerebral blood products. No acute infarct. No acute intracranial hemorrhage. No mass effect or midline shift. No hydrocephalus. The sella is unremarkable. Normal flow voids. No mass. ORBITS: No acute abnormality. SINUSES: Paranasal sinuses are clear. Mastoids are well aerated. Grossly normal visible internal auditory structures. BONES AND SOFT TISSUES: Normal visible cervical spine. Bone marrow signal is within normal limits. Partially visible reactive appearing level 2 cervical lymph nodes. Otherwise negative visible scalp and face. IMPRESSION: 1. Normal for age MRI appearance of the brain. Electronically signed by: Helayne Hurst MD 03/12/2024 06:56 AM EST RP Workstation: [id number]   CT HEAD WO CONTRAST ( ) Result Date: 03/12/2024  EXAM: CT HEAD WITHOUT CONTRAST 03/12/2024 02:51:36 AM TECHNIQUE: CT of the head was performed without the administration of intravenous contrast. Automated exposure control, iterative reconstruction, and/or weight based adjustment of the mA/kV was utilized to reduce the radiation dose to as low as reasonably achievable. COMPARISON: CT head December 02 2023 CLINICAL HISTORY: Meningitis/CNS infection suspected. FINDINGS: BRAIN AND VENTRICLES: No acute hemorrhage. No evidence of acute infarct. No hydrocephalus. No extra-axial collection. No mass effect or midline shift. ORBITS: No acute abnormality. SINUSES: No acute abnormality. SOFT TISSUES AND SKULL: No acute soft tissue abnormality. No skull fracture. IMPRESSION: 1. No acute intracranial abnormality. Electronically signed by: Gilmore Molt MD 03/12/2024 03:13 AM EST RP Workstation: [id number]    I spent 85 minutes involved in face-to-face and non-face-to-face activities for this patient on the day of the visit. Professional time spent includes the following activities: Preparing to see the patient (review  of tests), Obtaining and reviewing separately obtained history (ED note, H&P, hospitalist progress note), Performing a medically appropriate examination and evaluation , Ordering medications/labs, referring and communicating with other health care professionals, Documenting clinical information in the EMR, Independently interpreting results (not separately reported), Communicating results to the patient, Counseling and educating the patient and Care coordination (not separately reported).  Electronically signed by:   Plan d/w requesting provider as well as ID pharm D  Of note, portions of this note may have been created with voice recognition software. While this note has been edited for accuracy, occasional wrong-word or 'sound-a-like' substitutions may have occurred due to the inherent limitations of voice recognition software.   Annalee Orem, MD Infectious Disease Physician [facility] Pager: [phone]

## 2024-03-12 NOTE — Progress Notes (Signed)
 Ativan  2mg  vial rcd from pharmacy as it was not loaded in the pyxis.  Pt given 1mg  IV per order.  Remaining 1mg  wasted, witnessed by second RN, Ginger Madison.  Pt given IV ativan  per order prior to going down to IR for an LP.

## 2024-03-12 NOTE — ED Provider Notes (Signed)
 [place] EMERGENCY DEPARTMENT AT [facility] Provider Note   CSN: [id number] Arrival date & time: 03/11/24  1802     History No chief complaint on file.   HPI Zoe Soto is a 43 y.o. female presenting for chief complaints of abnormal labs.  She was being evaluated by infectious disease with a myriad of symptoms over the past few months and ultimately tested positive for syphilis yesterday and was told to come to the ER for an MRI and a lumbar puncture Denies any acute symptoms today.  Has been having some visual blurriness intermittently as well as some headaches over the past few weeks but none acutely at this time.  Denies fevers chills nausea vomiting syncope shortness of breath..   Patient's recorded medical, surgical, social, medication list and allergies were reviewed in the Snapshot window as part of the initial history.   Review of Systems   Review of Systems  Constitutional:  Negative for chills and fever.  HENT:  Negative for ear pain and sore throat.   Eyes:  Positive for visual disturbance. Negative for pain.  Respiratory:  Negative for cough and shortness of breath.   Cardiovascular:  Negative for chest pain and palpitations.  Gastrointestinal:  Negative for abdominal pain and vomiting.  Genitourinary:  Negative for dysuria and hematuria.  Musculoskeletal:  Negative for arthralgias and back pain.  Skin:  Negative for color change and rash.  Neurological:  Positive for headaches. Negative for seizures and syncope.  All other systems reviewed and are negative.   Physical Exam Updated Vital Signs BP 130/87   Pulse 68   Temp 97.9 F (36.6 C) (Oral)   Resp 16   SpO2 100%  Physical Exam Vitals and nursing note reviewed.  Constitutional:      General: She is not in acute distress.    Appearance: She is well-developed.  HENT:     Head: Normocephalic and atraumatic.  Eyes:     Conjunctiva/sclera: Conjunctivae normal.  Cardiovascular:     Rate  and Rhythm: Normal rate and regular rhythm.     Heart sounds: No murmur heard. Pulmonary:     Effort: Pulmonary effort is normal. No respiratory distress.     Breath sounds: Normal breath sounds.  Abdominal:     General: There is no distension.     Palpations: Abdomen is soft.     Tenderness: There is no abdominal tenderness. There is no right CVA tenderness or left CVA tenderness.  Musculoskeletal:        General: No swelling or tenderness. Normal range of motion.     Cervical back: Neck supple.  Skin:    General: Skin is warm and dry.  Neurological:     General: No focal deficit present.     Mental Status: She is alert and oriented to person, place, and time. Mental status is at baseline.     Cranial Nerves: No cranial nerve deficit.      ED Course/ Medical Decision Making/ A&P    Procedures Lumbar Puncture  Date/Time: 03/12/2024 5:04 AM  Performed by: Jerral Meth, MD Authorized by: Jerral Meth, MD   Consent:    Consent obtained:  Verbal   Consent given by:  Patient   Risks, benefits, and alternatives were discussed: yes     Risks discussed:  Bleeding, repeat procedure, nerve damage, headache and infection Universal protocol:    Patient identity confirmed:  Verbally with patient Pre-procedure details:    Procedure purpose:  Diagnostic  Preparation: Patient was prepped and draped in usual sterile fashion   Anesthesia:    Anesthesia method:  Local infiltration Procedure details:    Lumbar space:  L4-L5 interspace   Patient position:  Sitting   Needle gauge:  22   Needle type:  Spinal needle - Quincke tip   Needle length (in):  3.5   Number of attempts:  1 Post-procedure details:    Puncture site:  Adhesive bandage applied   Procedure completion:  Procedure terminated at patient's request Comments:     No fluid collected.  Limited by patient's tolerance of procedure.    Medications Ordered in ED Medications  HYDROmorphone  (DILAUDID ) injection 0.5  mg (0.5 mg Intravenous Given 03/12/24 0337)  lidocaine -EPINEPHrine  (XYLOCAINE  W/EPI) 2 %-1:100000 (with pres) injection 20 mL (20 mLs Intradermal Given by Other 03/12/24 0417)    Medical Decision Making:   43 year old female with likely syphilis sent to the emergency room for advanced testing by infectious disease. Per their request, an MRI was ordered, not available by the time patient was bedded in the emergency room.  Ordered for a.m. hours per their request. LP was attempted but patient was not able to tolerate the procedure.  She could not tolerate laying on her side due to leg discomfort despite medication administration.  In the sitting position, patient endorsed and nausea when leaning forward and had very limited spinal mobility secondary to her nausea.  As soon as the needle into the dural space, patient started retching and could not hold still for the procedure and it was terminated at her request.  She stated that she would rather try again in the morning and repeat the procedure. I consulted infectious disease Dr. Luiz who stated to hold treatment until diagnosis confirmed on LP in the morning.  Disposition:   Based on the above findings, I believe this patient is stable for admission.    Patient/family educated about specific findings on our evaluation and explained exact reasons for admission.  Patient/family educated about clinical situation and time was allowed to answer questions.   Admission team communicated with and agreed with need for admission. Patient admitted. Patient ready to move at this time.     Emergency Department Medication Summary:   Medications  HYDROmorphone  (DILAUDID ) injection 0.5 mg (0.5 mg Intravenous Given 03/12/24 0337)  lidocaine -EPINEPHrine  (XYLOCAINE  W/EPI) 2 %-1:100000 (with pres) injection 20 mL (20 mLs Intradermal Given by Other 03/12/24 0417)        Clinical Impression:  1. RPR positive      Admit   Final Clinical Impression(s) / ED  Diagnoses Final diagnoses:  RPR positive    Rx / DC Orders ED Discharge Orders     None         Jerral Meth, MD 03/12/24 [phone]

## 2024-03-12 NOTE — Plan of Care (Signed)

## 2024-03-12 NOTE — ED Notes (Signed)
LP kit at bedside

## 2024-03-12 NOTE — Assessment & Plan Note (Signed)
 Continue fluoxetine 

## 2024-03-12 NOTE — Assessment & Plan Note (Signed)
 likely from anemia. Recently seen by oncologist. JAK2 screen is pending.

## 2024-03-12 NOTE — Progress Notes (Signed)
 Progress Note   Patient: Zoe Soto FMW:[id number] DOB: Sep 11, 1980 DOA: 03/11/2024     0 DOS: the patient was seen and examined on 03/12/2024   Brief hospital course: 43yo with h/o depression who presented from ID clinic with RPR 1:256 and + for FTA-ABS.  CMV titers were also recently positive.  She has had neurologic symptoms and a rash, concerning for neurosyphilis.  LP was attempted without success; IR LP ordered.  MRI normal.  ID consulted.    Assessment & Plan Polyarthralgia RPR positive Concerning for syphilis particularly neurosyphilis given the visual changes   Infectious disease consultant Dr. Montie Cleverly was consulted by ER physician and Dr. Dea is consulting now   Unsuccessful LP attempt in the ER, repeat under fluoroscopy today, labs pending MRI negative Patient reports an eye exam about 2 weeks ago but will need another exam as inpatient given new information (diagnosis of syphilis) Started on PCN G for empiric coverage of neurosyphilis Iron  deficiency anemia On OTC supplements Depressive disorder Continue fluoxetine  Thrombocytosis likely from anemia. Recently seen by oncologist. JAK2 screen is pending.  Class 1 obesity due to excess calories with body mass index (BMI) of 33.0 to 33.9 in adult Body mass index is 33.31 kg/m.SABRA  Weight loss should be encouraged Outpatient PCP/bariatric medicine f/u encouraged Significantly low or high BMI is associated with higher medical risk including morbidity and mortality       Consultants: ID IR  Procedures: LP 11/5  Antibiotics: Penicillin G 11/6-  30 Day Unplanned Readmission Risk Score    Flowsheet Row ED to Hosp-Admission (Current) from 03/11/2024 in [facility]  30 Day Unplanned Readmission Risk Score (%) 9.71 Filed at 03/12/2024 0801    This score is the patient's risk of an unplanned readmission within 30 days of being discharged (0 -100%). The score is based  on dignosis, age, lab data, medications, orders, and past utilization.   Low:  0-14.9   Medium: 15-21.9   High: 22-29.9   Extreme: 30 and above           Subjective: She is feeling ok. She hasn't had much of a headache.  She reports that her husband killed himself on her birthday in 2022 and a number of truths have come to light in the interim; she is concerned that he led to her infection.  She has had 1 partner since and remains in a committed relationship with him (he is going to get tested now).   Objective: Vitals:   03/12/24 0809 03/12/24 1252  BP: 113/78 115/70  Pulse: 71 79  Resp: 18 18  Temp: 98.3 F (36.8 C) 98.1 F (36.7 C)  SpO2: 100% 97%   No intake or output data in the 24 hours ending 03/12/24 1601 Filed Weights   03/12/24 0809  Weight: 93.6 kg    Exam:  General:  Appears calm and comfortable and is in NAD Eyes:  normal lids, iris ENT:  grossly normal hearing, lips & tongue, mmm Cardiovascular:  RRR. No LE edema.  Respiratory:   CTA bilaterally with no wheezes/rales/rhonchi.  Normal respiratory effort. Abdomen:  soft, NT, ND Skin:  no rash or induration seen on limited exam Musculoskeletal:  grossly normal tone BUE/BLE, good ROM, no bony abnormality Psychiatric:  grossly normal/mildly anxious mood and affect, speech fluent and appropriate, AOx3 Neurologic:  CN 2-12 grossly intact, moves all extremities in coordinated fashion  Data Reviewed: I have reviewed the patient's lab results since admission.  Pertinent labs for today include:   Essentially normal CMP WBC 6 Hgb 9.5 Platelets 466     Family Communication: None present     Code Status: Full Code   Disposition: Status is: Inpatient Remains inpatient appropriate because: ongoing management     Time spent: 50 minutes  Unresulted Labs (From admission, onward)     Start     Ordered   03/12/24 1300  Miscellaneous [facility] test (send-out)  Once,   R       Comments: Treponema pallidum  Antibody, IgG by IFA (CSF)   Question:  Test name / description:  Answer:  Code [id number]   03/12/24 1300   03/12/24 1226  Pathologist smear review  Once,   STAT        03/12/24 1226   03/12/24 1226  Pathologist smear review  Once,   STAT        03/12/24 1226   03/12/24 1220  Meningitis/Encephalitis Panel (CSF)  ONCE - STAT,   STAT        03/12/24 1230   03/12/24 0145  Lyme disease dna by pcr(borrelia burg)  ONCE - STAT,   URGENT        03/12/24 0144   03/12/24 0145  T.pallidum Ab, Total  Once,   URGENT        03/12/24 0144   03/12/24 0139  Meningitis/Encephalitis Panel (CSF)  ONCE - STAT,   URGENT        03/12/24 0144   03/12/24 0139  VDRL  ONCE - STAT,   URGENT        03/12/24 0144   03/12/24 0139  VZV PCR, CSF  ONCE - STAT,   URGENT        03/12/24 0144   03/12/24 0139  JC virus, PRC CSF  ONCE - STAT,   URGENT        03/12/24 0144   03/11/24 1931  Wet prep, genital  Once,   URGENT        03/11/24 1931             Author: Delon Herald, MD 03/12/2024 4:01 PM  For on call review [email or web address].

## 2024-03-12 NOTE — Assessment & Plan Note (Signed)
On OTC supplements.

## 2024-03-12 NOTE — H&P (Signed)
 History and Physical    Tinea Nobile FMW:[id number] DOB: [birth year] DOA: 03/11/2024  Patient coming from: Home.  Chief Complaint: Abnormal labs.  HPI: Zoe Soto is a 43 y.o. female with history of depression has been experiencing bodyaches subjective feeling of fever chills headaches and over the last 2 weeks has been having some floaters in the eyes had followed up with infectious disease consultant Dr. Dennise who had done labs which came back positive RPR with a titer of 1 is to 256 and positive for FTA-ABS.  Patient's CMV titers also recently positive.  Patient's symptoms started about in August 2025 when patient returned back from a cruise to [place].  At that time patient has been having fever chills and headaches with bodyaches and polyarthralgia.  Initially she was concerned that her migraines came back.  Patient used to have migraines when she was in the teens but improved after childbirth.  She did go to the ER and CT head was unremarkable.  Her fevers improved but she has been having generalized body ache joint pains and had to take Tylenol  ibuprofen almost every other day.  Patient also recently followed with oncologist for anemia and thrombocytosis.  Denies taking any new medications and has been taking fluoxetine  long time.  No one else who went along with her for the cruise is sick.  Patient denies any shortness of breath chest pain nausea vomiting diarrhea abdominal pain or any vaginal discharge.  Was recently treated for Candida vaginitis with fluconazole .  Patient started developing a macular rash on the back and abdomen.  The rash was not itchy.  Given the vision changes with floaters patient did follow-up with ophthalmologist 2 weeks ago as per the patient was told that she likely has ocular migraine.  Since patient's RPR and FTA-ABS came positive with all the above symptoms infectious disease consult Dr. Dennise was concerned for neurosyphilis and patient was advised to come to the  ER for lumbar puncture.  ED Course: In the ER CT head is unremarkable.  Patient appears nonfocal.  Patient is afebrile.  Labs show WBC of 5.6 hemoglobin 10 platelets 484 creatinine 1.1 LFTs were unremarkable.  Lumbar puncture was attempted but was unsuccessful.  MRI brain which was requested also is pending.  Patient admitted for further management of patient's symptoms primary concerning for neurosyphilis.  Review of Systems: As per HPI, rest all negative.   Past Medical History:  Diagnosis Date   Allergy    Seasonal   Anxiety    Asthma    Bee sting allergy    GERD (gastroesophageal reflux disease)    Herpes simplex virus (HSV) infection    History of chickenpox    Pneumonia 08/2017   RESOLVED    Past Surgical History:  Procedure Laterality Date   ABDOMINOPLASTY     ANKLE SURGERY Right 2011   DILITATION & CURRETTAGE/HYSTROSCOPY WITH NOVASURE ABLATION N/A 06/27/2018   Procedure: DILATATION & CURETTAGE/HYSTEROSCOPY WITH NOVASURE ABLATION;  Surgeon: Marne Kelly Nest, MD;  Location: [facility] [place];  Service: Gynecology;  Laterality: N/A;   GASTRIC BYPASS  07/2015   GASTRIC BYPASS     KNEE ARTHROSCOPY Left 2010   LAPAROSCOPIC TUBAL LIGATION Bilateral 06/27/2018   Procedure: LAPAROSCOPIC TUBAL LIGATION;  Surgeon: Marne Kelly Nest, MD;  Location: [facility];  Service: Gynecology;  Laterality: Bilateral;   SMALL BOWEL REPAIR N/A 06/27/2018   Procedure: SMALL BOWEL REPAIR;  Surgeon: Marne Kelly Nest, MD;  Location: [facility];  Service: Gynecology;  Laterality: N/A;     reports that she has quit smoking. She has never used smokeless tobacco. She reports current alcohol use of about 4.0 standard drinks of alcohol per week. She reports that she does not use drugs.  Allergies  Allergen Reactions   Wasp Venom Anaphylaxis and Hives   Naproxen Other (See Comments)    Hematuria when taken in large doses   Beeswax    Other      Ants   Lyrica  [Pregabalin ] Rash   Tape Rash    ADHESIVE AND PAPER TAPE REMOVES SKIN    Family History  Problem Relation Age of Onset   Alcohol abuse Mother    Heart attack Father 54   Heart disease Father    Asthma Brother    Diabetes Brother    Pancreatic cancer Maternal Grandmother    Diabetes Maternal Aunt    Congenital heart disease Son    Esophageal cancer Maternal Uncle    Esophageal cancer Maternal Uncle    Colon cancer Neg Hx    Stomach cancer Neg Hx     Prior to Admission medications   Medication Sig Start Date End Date Taking? Authorizing Provider  albuterol  (VENTOLIN  HFA) 108 (90 Base) MCG/ACT inhaler Inhale 2 puffs into the lungs every 6 (six) hours as needed for wheezing or shortness of breath. 07/18/22   Tabori, Katherine E, MD  budesonide  (PULMICORT  FLEXHALER) 180 MCG/ACT inhaler Inhale 2 puffs into the lungs daily as needed. 04/05/21   Kip Ade, NP  clonazePAM  (KLONOPIN ) 0.5 MG disintegrating tablet Take 1-2 tablets (0.5-1 mg total) by mouth 3 (three) times daily as needed (anxiety). 04/19/23   Tabori, Katherine E, MD  cyanocobalamin  (VITAMIN B12) 1000 MCG/ML injection Inject 1 mL (1,000 mcg total) into the muscle every 30 (thirty) days. 02/26/24   Tabori, Katherine E, MD  EPINEPHrine  0.3 mg/0.3 mL IJ SOAJ injection Inject 0.3 mg into the muscle as needed for anaphylaxis. 02/13/24   Mesner, Selinda, MD  FLOVENT  HFA 110 MCG/ACT inhaler Inhale 2 puffs into the lungs daily as needed. 07/23/22   Mahlon Comer BRAVO, MD  fluconazole  (DIFLUCAN ) 150 MG tablet Take 1 tablet (150 mg total) by mouth daily. 03/02/24   Dennise Kingsley, MD  FLUoxetine  (PROZAC ) 20 MG capsule TAKE 1 CAPSULE BY MOUTH EVERY DAY 01/10/24   Tabori, Katherine E, MD  FLUTICASONE  PROPIONATE, NASAL, NA Place 1-2 sprays into the nose as needed (for allergies).    [provider]  HYDROcodone -acetaminophen  (NORCO/VICODIN) 5-325 MG tablet Take 1 tablet by mouth every 6 (six) hours as needed for  moderate pain (pain score 4-6). 12/19/23   Levora Reyes SAUNDERS, MD  Vitamin D , Ergocalciferol , (DRISDOL ) 1.25 MG (50000 UNIT) CAPS capsule Take 1 capsule (50,000 Units total) by mouth every 7 (seven) days. 01/29/24   Tabori, Katherine E, MD  ZEPBOUND 5 MG/0.5ML Pen Inject 5 mg into the skin once a week. 02/25/24   [provider]    Physical Exam: Constitutional: Moderately built and nourished. Vitals:   03/12/24 0114 03/12/24 0115 03/12/24 0330 03/12/24 0430  BP:  126/75 130/87   Pulse:  66 68   Resp:  16 16   Temp: 98.5 F (36.9 C)   97.9 F (36.6 C)  TempSrc: Oral   Oral  SpO2:  100% 100%    Eyes: Anicteric no pallor. ENMT: No discharge from the ears eyes nose or mouth. Neck: No mass felt.  No neck rigidity. Respiratory: No rhonchi or crepitations. Cardiovascular:  S1-S2 heard. Abdomen: Soft nontender bowel sounds present. Musculoskeletal: No edema. Skin: Macular rash in the back. Neurologic: Alert awake oriented time place and person.  Moves all extremities. Psychiatric: Appears normal.  Normal affect.   Labs on Admission: I have personally reviewed following labs and imaging studies  CBC: Recent Labs  Lab 03/09/24 1416 03/11/24 1939  WBC 6.1 5.6  NEUTROABS 4.3 3.7  HGB 10.5* 10.0*  HCT 33.6* 33.3*  MCV 70.4* 71.8*  PLT 521* 584*   Basic Metabolic Panel: Recent Labs  Lab 03/11/24 1939  NA 138  K 4.0  CL 106  CO2 22  GLUCOSE 85  BUN 13  CREATININE 1.12*  CALCIUM 9.5   GFR: Estimated Creatinine Clearance: 74.2 mL/min (A) (by C-G formula based on SCr of 1.12 mg/dL (H)). Liver Function Tests: Recent Labs  Lab 03/11/24 1939  AST 17  ALT 8  ALKPHOS 60  BILITOT 0.4  PROT 8.2*  ALBUMIN 3.8   No results for input(s): LIPASE, AMYLASE in the last 168 hours. No results for input(s): AMMONIA in the last 168 hours. Coagulation Profile: No results for input(s): INR, PROTIME in the last 168 hours. Cardiac Enzymes: No results for input(s):  CKTOTAL, CKMB, CKMBINDEX, TROPONINI in the last 168 hours. BNP (last 3 results) No results for input(s): PROBNP in the last 8760 hours. HbA1C: No results for input(s): HGBA1C in the last 72 hours. CBG: No results for input(s): GLUCAP in the last 168 hours. Lipid Profile: No results for input(s): CHOL, HDL, LDLCALC, TRIG, CHOLHDL, LDLDIRECT in the last 72 hours. Thyroid  Function Tests: No results for input(s): TSH, T4TOTAL, FREET4, T3FREE, THYROIDAB in the last 72 hours. Anemia Panel: Recent Labs    03/09/24 1416  FERRITIN 27  TIBC 385  IRON  33   Urine analysis:    Component Value Date/Time   COLORURINE YELLOW 03/11/2024 1939   APPEARANCEUR HAZY (A) 03/11/2024 1939   LABSPEC 1.020 03/11/2024 1939   PHURINE 5.0 03/11/2024 1939   GLUCOSEU NEGATIVE 03/11/2024 1939   HGBUR LARGE (A) 03/11/2024 1939   BILIRUBINUR NEGATIVE 03/11/2024 1939   BILIRUBINUR NEG 02/18/2023 1505   KETONESUR NEGATIVE 03/11/2024 1939   PROTEINUR >=300 (A) 03/11/2024 1939   UROBILINOGEN 0.2 02/18/2023 1505   NITRITE NEGATIVE 03/11/2024 1939   LEUKOCYTESUR MODERATE (A) 03/11/2024 1939   Sepsis Labs: @LABRCNTIP (procalcitonin:4,lacticidven:4) ) Recent Results (from the past 240 hours)  Culture, blood (single) w Reflex to ID Panel     Status: None   Collection Time: 03/02/24 10:29 AM   Specimen: Blood  Result Value Ref Range Status   MICRO NUMBER: [id number]  Final   SPECIMEN QUALITY: Adequate  Final   Source BLOOD 1  Final   STATUS: FINAL  Final   Result: No growth after 5 days  Final   COMMENT: Aerobic and anaerobic bottle received.  Final  Culture, blood (single) w Reflex to ID Panel     Status: None   Collection Time: 03/02/24 10:35 AM   Specimen: Blood  Result Value Ref Range Status   MICRO NUMBER: [id number]  Final   SPECIMEN QUALITY: Adequate  Final   Source BLOOD 2  Final   STATUS: FINAL  Final   Result: No growth after 5 days  Final   COMMENT: Aerobic and  anaerobic bottle received.  Final     Radiological Exams on Admission: CT HEAD WO CONTRAST ( ) Result Date: 03/12/2024 EXAM: CT HEAD WITHOUT CONTRAST 03/12/2024 02:51:36 AM TECHNIQUE: CT of the head was performed without  the administration of intravenous contrast. Automated exposure control, iterative reconstruction, and/or weight based adjustment of the mA/kV was utilized to reduce the radiation dose to as low as reasonably achievable. COMPARISON: CT head December 02 2023 CLINICAL HISTORY: Meningitis/CNS infection suspected. FINDINGS: BRAIN AND VENTRICLES: No acute hemorrhage. No evidence of acute infarct. No hydrocephalus. No extra-axial collection. No mass effect or midline shift. ORBITS: No acute abnormality. SINUSES: No acute abnormality. SOFT TISSUES AND SKULL: No acute soft tissue abnormality. No skull fracture. IMPRESSION: 1. No acute intracranial abnormality. Electronically signed by: Gilmore Molt MD 03/12/2024 03:13 AM EST RP Workstation: [id number]     Assessment/Plan Principal Problem:   Polyarthralgia Active Problems:   Iron  deficiency anemia   Depressive disorder   Thrombocytosis   RPR positive    Polyarthralgia myalgia with subjective feeling of fever chills and positive RPR and FTA-ABS concerning for syphilis particularly neurosyphilis given the visual changes.  Infectious disease consultant Dr. Montie Cleverly was consulted by ER physician.  At this time lumbar puncture was attempted but was unsuccessful.  Will be consulting radiology for fluoroscopic guided lumbar puncture and CSF labs including VDRL Lyme titers, meningitis/encephalitis panel cell count differential protein has been ordered.  MRI brain is pending.  Will await further official recommendations from infectious disease. Depression on fluoxetine . Iron  deficiency anemia on over-the-counter iron  supplements. Thrombocytosis likely from anemia.  Recently seen by oncologist.  JAK2 screen is pending.  Since patient has  features concerning for neurosyphilis will need close monitoring further workup and more than 2 midnight stay.   DVT prophylaxis: SCDs. Code Status: Full code. Family Communication: Discussed with patient. Disposition Plan: Medical floor. Consults called: ER physician discussed with infectious disease consultant. Admission status: Inpatient.

## 2024-03-12 NOTE — Consult Note (Signed)
 Chief Complaint/Reason for Consultation: Rule out ocular involvement of syphilis    HPI: 43 yo F presents for ophthalmology consultation to investigate the possibility of neurosyphilis/ocular involvement of syphilis. She was admitted after various generalized symptoms including body aches, joint pains, fever, chills, headaches. Workup revealed CMV and a positive RPR and patient was sent to ED for possible neurosyphilis. LP was performed, most of which the labs from are still pending.  Pt notes mild new floaters and flashes of light over the last approximately 3 weeks. She was seen by her established optometrist in [place] 3 weeks ago and diagnosed with possible ocular migraines. She also c/o more redness OU over the last 2-3 weeks intermittently, though without any significant ocular pain.    ROS: otherwise as in HPI  Patient Active Problem List   Diagnosis Date Noted   RPR positive 03/12/2024   Polyarthralgia 03/12/2024   Class 1 obesity due to excess calories with body mass index (BMI) of 33.0 to 33.9 in adult 03/12/2024   Thrombocytosis 03/09/2024   Lymphadenopathy 03/09/2024   Vaginal discharge 09/27/2020   Anxiety 09/26/2020   Depressive disorder 09/26/2020   Environmental and seasonal allergies 07/20/2020   Eustachian tube dysfunction 07/20/2020   Asthma    Heart palpitations 05/13/2019   Paroxysmal SVT (supraventricular tachycardia) 05/13/2019   PAC (premature atrial contraction) 05/13/2019   PVC (premature ventricular contraction) 05/13/2019   H/O laparoscopy 06/27/2018   Iron  deficiency anemia 06/04/2018   Herpes simplex virus type 2 (HSV-2) infection affecting pregnancy in third trimester 02/21/2018   Intramural leiomyoma of uterus 05/26/2013   Irregular menstruation 08/04/2010   No current facility-administered medications on file prior to encounter.   Current Outpatient Medications on File Prior to Encounter  Medication Sig Dispense Refill   albuterol  (VENTOLIN   HFA) 108 (90 Base) MCG/ACT inhaler Inhale 2 puffs into the lungs every 6 (six) hours as needed for wheezing or shortness of breath. 8 g 0   budesonide  (PULMICORT  FLEXHALER) 180 MCG/ACT inhaler Inhale 2 puffs into the lungs daily as needed. 1 each 12   clonazePAM  (KLONOPIN ) 0.5 MG disintegrating tablet Take 1-2 tablets (0.5-1 mg total) by mouth 3 (three) times daily as needed (anxiety). 60 tablet 1   cyanocobalamin  (VITAMIN B12) 1000 MCG/ML injection Inject 1 mL (1,000 mcg total) into the muscle every 30 (thirty) days. 10 mL 0   EPINEPHrine  0.3 mg/0.3 mL IJ SOAJ injection Inject 0.3 mg into the muscle as needed for anaphylaxis. 1 each 1   FLOVENT  HFA 110 MCG/ACT inhaler Inhale 2 puffs into the lungs daily as needed. 1 each 12   fluconazole  (DIFLUCAN ) 150 MG tablet Take 1 tablet (150 mg total) by mouth daily. 1 tablet 0   FLUoxetine  (PROZAC ) 20 MG capsule TAKE 1 CAPSULE BY MOUTH EVERY DAY 90 capsule 1   FLUTICASONE  PROPIONATE, NASAL, NA Place 1-2 sprays into the nose as needed (for allergies).     HYDROcodone -acetaminophen  (NORCO/VICODIN) 5-325 MG tablet Take 1 tablet by mouth every 6 (six) hours as needed for moderate pain (pain score 4-6). 15 tablet 0   Vitamin D , Ergocalciferol , (DRISDOL ) 1.25 MG (50000 UNIT) CAPS capsule Take 1 capsule (50,000 Units total) by mouth every 7 (seven) days. 12 capsule 0   ZEPBOUND 5 MG/0.5ML Pen Inject 5 mg into the skin once a week.     Allergies  Allergen Reactions   Wasp Venom Anaphylaxis and Hives   Naproxen Other (See Comments)    Hematuria when taken in large doses  Beeswax    Other     Ants   Lyrica  [Pregabalin ] Rash   Tape Rash    ADHESIVE AND PAPER TAPE REMOVES SKIN    EXAMINATION  VAcc (near): OD: 20/20   OS: 20/25-1    Pupils:  OD: Equal, round, reactive, no APD OS: Equal, round, reactive, no APD  T(iCare): OD:  9  mm Hg OS:  8  mm Hg  CVF: full to finger counting OU EOM: full OU, no limitation or restriction OU  Anterior segment  90D/indirect Exam: Ext/Lids: normal OU Conj/Sclera: 1+ injection peripheral conj OU Cornea: clear OU, no abrasion or infiltrate OU, no corneal haze OU, no obvious keratic precipitates OU AC: Deep and Quiet OU, no hypopyon OU Iris: Round and Flat OU Lens: Clear OU  Dilated OU with phenylephrine and tropicamide OU @ 2043 hours  Dilated Fundus Exam: Vitreous: Clear OU, no vitritis OU, no snoballs or snowbanking OU Disc: sharp and pink with 0.2 c/d OU, no disc edema or hemorrhage OU Macula: flat and dry OU, no chorioretinal infiltrates OU Vessels: normal distribution OU, perfused OU, no vascular sheathing OU Periphery: flat and attached 360 without breaks or tears OU, no chorioretinal infiltrates OU     Imp/Plan:  No conclusive evidence of ocular involvement of syphilis Posterior segment is free of chorioretinitis or vitritis Anterior segment exam is somewhat limited in the hospital setting without regular slit lamp exam in as in an outpatient ophthalmology clinic. I am unable to accurately access anterior chamber for cell/flare, so there is a small chance of anterior uveitis/iritis that is undetected, though typically I would expect a much higher level of redness, pain, and photophobia if this were the case. Can consider re-evaluation in outpatient ophthalmology clinic after patient eventually discharges Complaint of Photopsias and floaters OU I do not have a straightforward explanation of these symptoms. They seem more constant than I would expect from ocular migraine. However, there is no significant retinal or vitreous pathology to associate with this.     Elspeth DOROTHA Aran, M.D. Ophthalmology [facility]

## 2024-03-13 ENCOUNTER — Other Ambulatory Visit: Payer: Self-pay

## 2024-03-13 ENCOUNTER — Inpatient Hospital Stay

## 2024-03-13 ENCOUNTER — Inpatient Hospital Stay: Admission: RE | Admit: 2024-03-13

## 2024-03-13 DIAGNOSIS — M2550 Pain in unspecified joint: Secondary | ICD-10-CM | POA: Diagnosis not present

## 2024-03-13 DIAGNOSIS — H43399 Other vitreous opacities, unspecified eye: Secondary | ICD-10-CM | POA: Diagnosis present

## 2024-03-13 LAB — GC/CHLAMYDIA PROBE AMP (~~LOC~~) NOT AT ARMC
Chlamydia: NEGATIVE
Comment: NEGATIVE
Comment: NORMAL
Neisseria Gonorrhea: NEGATIVE

## 2024-03-13 LAB — VZV PCR, CSF: VZV PCR, CSF: NEGATIVE

## 2024-03-13 LAB — PATHOLOGIST SMEAR REVIEW

## 2024-03-13 LAB — T.PALLIDUM AB, TOTAL: T Pallidum Abs: REACTIVE — AB

## 2024-03-13 MED ORDER — SODIUM CHLORIDE 0.9% FLUSH
10.0000 mL | Freq: Two times a day (BID) | INTRAVENOUS | Status: DC
  Administered 2024-03-13: 20 mL
  Administered 2024-03-14: 10 mL

## 2024-03-13 MED ORDER — CHLORHEXIDINE GLUCONATE CLOTH 2 % EX PADS
6.0000 | MEDICATED_PAD | Freq: Every day | CUTANEOUS | Status: DC
  Administered 2024-03-14: 6 via TOPICAL

## 2024-03-13 MED ORDER — CLONAZEPAM 0.125 MG PO TBDP
0.2500 mg | ORAL_TABLET | Freq: Once | ORAL | Status: AC
  Administered 2024-03-13: 0.25 mg via ORAL
  Filled 2024-03-13: qty 2

## 2024-03-13 MED ORDER — PENICILLIN G POTASSIUM IV (FOR PTA / DISCHARGE USE ONLY): 24.0000 10*6.[IU] | INTRAVENOUS | 0 refills | Status: AC

## 2024-03-13 MED ORDER — SODIUM CHLORIDE 0.9% FLUSH: 10.0000 mL | INTRAVENOUS | Status: DC | PRN

## 2024-03-13 NOTE — Assessment & Plan Note (Signed)
 Continue fluoxetine 

## 2024-03-13 NOTE — Progress Notes (Signed)
 Okay to place PICC per Dr. Luiz with infectious disease.

## 2024-03-13 NOTE — Assessment & Plan Note (Signed)
 Seen by ophthalmology for this issue a couple of weeks ago Given new diagnosis of syphilis, ophthalmology was consulted here Seen by Dr. Marcey on 11/6 No conclusive evidence of ocular involvement of syphilis Also does not appear to be c/w ocular migraine Recommend outpatient ophthalmology f/u for slit lamp exam

## 2024-03-13 NOTE — TOC Initial Note (Signed)
 Transition of Care [facility]) - Initial/Assessment Note    Patient Details  Name: Zoe Soto MRN: [id number] Date of Birth: [birth year]  [facility]) CM/SW Contact:    Doneta Glenys DASEN, RN Phone Number: 03/13/2024, 3:14 PM  Clinical Narrative:                 Requested by Dr. Dennise, Infectious Disease to have a lumbar puncture. PTA lives in a house with daughter (45 y.o.);Denies DME,HH, oxygen, or SDOH risk. Patient plans to drive self home at discharge. CM discussed plan of care about home IV axt. Patient agreeable to [facility] providing infusion and RN.  Expected Discharge Plan: Home w Home Health Services Barriers to Discharge: Continued Medical Work up   Patient Goals and CMS Choice Patient states their goals for this hospitalization and ongoing recovery are:: Home with my daughter CMS Medicare.gov Compare Post Acute Care list provided to:: Patient Choice offered to / list presented to : Patient [place] ownership interest in [facility].provided to:: Parent NA    Expected Discharge Plan and Services   Discharge Planning Services: CM Consult   Living arrangements for the past 2 months: Single Family Home                 DME Arranged: N/A DME Agency: NA       HH Arranged: RN HH Agency: [facility] Date HH Agency Contacted: 03/13/24 Time HH Agency Contacted: 1445 Representative spoke with at [facility] Agency: Holley Herring  Prior Living Arrangements/Services Living arrangements for the past 2 months: Single Family Home Lives with:: Minor Children (51 yo daughter) Patient language and need for interpreter reviewed:: Yes Do you feel safe going back to the place where you live?: Yes      Need for Family Participation in Patient Care: Yes (Comment) Care giver support system in place?: Yes (comment) Current home services:  (NA) Criminal Activity/Legal Involvement Pertinent to Current Situation/Hospitalization: No - Comment as needed  Activities of Daily  Living      Permission Sought/Granted Permission sought to share information with : Case Manager, Magazine Features Editor Permission granted to share information with : Yes, Verbal Permission Granted     Permission granted to share info w AGENCY: [facility]        Emotional Assessment Appearance:: Appears stated age Attitude/Demeanor/Rapport: Engaged, Gracious Affect (typically observed): Appropriate Orientation: : Oriented to Self, Oriented to Place, Oriented to  Time, Oriented to Situation Alcohol / Substance Use: Not Applicable Psych Involvement: No (comment)  Admission diagnosis:  RPR positive [A53.0] Patient Active Problem List   Diagnosis Date Noted   Floaters with photopsia 03/13/2024   RPR positive 03/12/2024   Polyarthralgia 03/12/2024   Class 1 obesity due to excess calories with body mass index (BMI) of 33.0 to 33.9 in adult 03/12/2024   Thrombocytosis 03/09/2024   Lymphadenopathy 03/09/2024   Vaginal discharge 09/27/2020   Anxiety 09/26/2020   Depressive disorder 09/26/2020   Environmental and seasonal allergies 07/20/2020   Eustachian tube dysfunction 07/20/2020   Asthma    Heart palpitations 05/13/2019   Paroxysmal SVT (supraventricular tachycardia) 05/13/2019   PAC (premature atrial contraction) 05/13/2019   PVC (premature ventricular contraction) 05/13/2019   H/O laparoscopy 06/27/2018   Iron  deficiency anemia 06/04/2018   Herpes simplex virus type 2 (HSV-2) infection affecting pregnancy in third trimester 02/21/2018   Intramural leiomyoma of uterus 05/26/2013   Irregular menstruation 08/04/2010   PCP:  Mahlon Comer BRAVO, MD Pharmacy:   [facility] [phone] -  [place], [place] - [address] [place] [address] Phone: [phone] Fax: [phone]  [facility] [address] [place] - [address] [place] [address] Phone: [phone] Fax:  [phone]     Social Drivers of Health (SDOH) Social History: SDOH Screenings   Food Insecurity: No Food Insecurity (03/13/2024)  Housing: Low Risk  (03/13/2024)  Recent Concern: Housing - High Risk (02/17/2024)  Transportation Needs: No Transportation Needs (03/13/2024)  Utilities: Not At Risk (03/13/2024)  Alcohol Screen: Low Risk  (02/17/2024)  Depression (PHQ2-9): Medium Risk (03/09/2024)  Financial Resource Strain: Low Risk  (02/17/2024)  Physical Activity: Inactive (02/17/2024)  Social Connections: Unknown (02/17/2024)  Recent Concern: Social Connections - Moderately Isolated (12/17/2023)  Stress: No Stress Concern Present (02/17/2024)  Recent Concern: Stress - Stress Concern Present (12/17/2023)  Tobacco Use: Medium Risk (03/12/2024)   SDOH Interventions:     Readmission Risk Interventions    03/13/2024    3:06 PM  Readmission Risk Prevention Plan  Post Dischage Appt Complete  Medication Screening Complete  Transportation Screening Complete

## 2024-03-13 NOTE — Plan of Care (Signed)

## 2024-03-13 NOTE — Assessment & Plan Note (Addendum)
 Body mass index is 33.31 kg/m.SABRA  Weight loss should be encouraged Outpatient PCP/bariatric medicine f/u encouraged Significantly low or high BMI is associated with higher medical risk including morbidity and mortality

## 2024-03-13 NOTE — Progress Notes (Signed)
 Progress Note   Patient: Zoe Soto FMW:[id number] DOB: 1980/11/12 DOA: 03/11/2024     1 DOS: the patient was seen and examined on 03/13/2024   Brief hospital course: 43yo with h/o depression who presented from ID clinic with RPR 1:256 and + for FTA-ABS.  CMV titers were also recently positive.  She has had neurologic symptoms and a rash, concerning for neurosyphilis.  LP was attempted without success; IR LP ordered.  MRI normal.  ID consulted.   Assessment & Plan Polyarthralgia RPR positive Concerning for syphilis particularly neurosyphilis given the visual changes   Infectious disease consultant Dr. Montie Cleverly was consulted by ER physician and Dr. Dea is consulting now   Unsuccessful LP attempt in the ER, repeat under fluoroscopy today, labs pending MRI negative Patient reports an eye exam about 2 weeks ago but will need another exam as inpatient given new information (diagnosis of syphilis) Started on PCN G for empiric coverage of neurosyphilis She will have a PICC line placed and dc to home with OPAT Floaters with photopsia Seen by ophthalmology for this issue a couple of weeks ago Given new diagnosis of syphilis, ophthalmology was consulted here Seen by Dr. Marcey on 11/6 No conclusive evidence of ocular involvement of syphilis Also does not appear to be c/w ocular migraine Recommend outpatient ophthalmology f/u for slit lamp exam Iron  deficiency anemia On OTC supplements Depressive disorder Continue fluoxetine  Thrombocytosis likely from anemia. Recently seen by oncologist. JAK2 screen is pending.  Class 1 obesity due to excess calories with body mass index (BMI) of 33.0 to 33.9 in adult Body mass index is 33.31 kg/m.SABRA  Weight loss should be encouraged Outpatient PCP/bariatric medicine f/u encouraged Significantly low or high BMI is associated with higher medical risk including morbidity and mortality       Consultants: ID IR   Procedures: LP 11/5    Antibiotics: Penicillin G 11/6-  30 Day Unplanned Readmission Risk Score    Flowsheet Row ED to Hosp-Admission (Current) from 03/11/2024 in [facility]  30 Day Unplanned Readmission Risk Score (%) 9.97 Filed at 03/13/2024 0801    This score is the patient's risk of an unplanned readmission within 30 days of being discharged (0 -100%). The score is based on dignosis, age, lab data, medications, orders, and past utilization.   Low:  0-14.9   Medium: 15-21.9   High: 22-29.9   Extreme: 30 and above           Subjective: Feeling better, eager to go home.  Lives at home with her 16yo daughter.   Objective: Vitals:   03/13/24 0443 03/13/24 1209  BP: 107/68 118/77  Pulse: 72 67  Resp: 18 18  Temp: 98 F (36.7 C) 98.1 F (36.7 C)  SpO2: 97% 100%    Intake/Output Summary (Last 24 hours) at 03/13/2024 1933 Last data filed at 03/13/2024 1030 Gross per 24 hour  Intake 180 ml  Output --  Net 180 ml   Filed Weights   03/12/24 0809  Weight: 93.6 kg    Exam:  General:  Appears calm and comfortable and is in NAD Eyes:  normal lids, iris ENT:  grossly normal hearing, lips & tongue, mmm Cardiovascular:  RRR. No LE edema.  Respiratory:   CTA bilaterally with no wheezes/rales/rhonchi.  Normal respiratory effort. Abdomen:  soft, NT, ND Skin:  no rash or induration seen on limited exam Musculoskeletal:  grossly normal tone BUE/BLE, good ROM, no bony abnormality Psychiatric:  grossly  normal mood and affect, speech fluent and appropriate, AOx3 Neurologic:  CN 2-12 grossly intact, moves all extremities in coordinated fashion  Data Reviewed: I have reviewed the patient's lab results since admission.  Pertinent labs for today include:     None today   Family Communication: None present      Code Status: Full Code   Disposition: Status is: Inpatient Remains inpatient appropriate because: ongoing management     Time spent: 50  minutes  Unresulted Labs (From admission, onward)     Start     Ordered   03/14/24 0500  CBC  Tomorrow morning,   R       Question:  Specimen collection method  Answer:  Lab=Lab collect   03/13/24 1933   03/12/24 0145  Lyme disease dna by pcr(borrelia burg)  ONCE - STAT,   URGENT        03/12/24 0144   03/12/24 0145  T.pallidum Ab, Total  Once,   URGENT        03/12/24 0144   03/12/24 0139  VDRL  ONCE - STAT,   URGENT        03/12/24 0144   03/12/24 0139  JC virus, PRC CSF  ONCE - STAT,   URGENT        03/12/24 0144   03/11/24 1931  Wet prep, genital  Once,   URGENT        03/11/24 1931             Author: Delon Herald, MD 03/13/2024 7:33 PM  For on call review [email or web address].

## 2024-03-13 NOTE — Progress Notes (Signed)
 PHARMACY CONSULT NOTE FOR:  OUTPATIENT  PARENTERAL ANTIBIOTIC THERAPY (OPAT)  Indication: Neurosyphilis Regimen: Penicillin 24 million units daily as a continuous infusion End date: 03/25/24  IV antibiotic discharge orders are pended. To discharging provider:  please sign these orders via discharge navigator,  Select New Orders & click on the button choice - Manage This Unsigned Work.     Thank you for allowing pharmacy to be a part of this patient's care.  Almarie Lunger, PharmD, BCPS, BCIDP Infectious Diseases Clinical Pharmacist 03/13/2024 1:57 PM   **Pharmacist phone directory can now be found on [email or web address] ([email or web address]).  Listed under [facility].

## 2024-03-13 NOTE — Assessment & Plan Note (Addendum)
 Concerning for syphilis particularly neurosyphilis given the visual changes   Infectious disease consultant Dr. Montie Cleverly was consulted by ER physician and Dr. Dea is consulting now   Unsuccessful LP attempt in the ER, repeat under fluoroscopy today, labs pending MRI negative Patient reports an eye exam about 2 weeks ago but will need another exam as inpatient given new information (diagnosis of syphilis) Started on PCN G for empiric coverage of neurosyphilis She will have a PICC line placed and dc to home with OPAT

## 2024-03-13 NOTE — Assessment & Plan Note (Signed)
 likely from anemia. Recently seen by oncologist. JAK2 screen is pending.

## 2024-03-13 NOTE — Assessment & Plan Note (Signed)
On OTC supplements.

## 2024-03-13 NOTE — Progress Notes (Signed)
 Peripherally Inserted Central Catheter Placement  The IV Nurse has discussed with the patient and/or persons authorized to consent for the patient, the purpose of this procedure and the potential benefits and risks involved with this procedure.  The benefits include less needle sticks, lab draws from the catheter, and the patient may be discharged home with the catheter. Risks include, but not limited to, infection, bleeding, blood clot (thrombus formation), and puncture of an artery; nerve damage and irregular heartbeat and possibility to perform a PICC exchange if needed/ordered by physician.  Alternatives to this procedure were also discussed.  Bard Power PICC patient education guide, fact sheet on infection prevention and patient information card has been provided to patient /or left at bedside.  [name], RN aware to change tubings prior to accessing PICC.  PICC Placement Documentation  PICC Double Lumen 03/13/24 Right Brachial 36 cm 0 cm (Active)  Indication for Insertion or Continuance of Line Home intravenous therapies (PICC only);Prolonged intravenous therapies 03/13/24 2212  Exposed Catheter (cm) 0 cm 03/13/24 2212  Site Assessment Clean, Dry, Intact 03/13/24 2212  Lumen #1 Status Flushed;Saline locked;Blood return noted 03/13/24 2212  Lumen #2 Status Flushed;Saline locked;Blood return noted 03/13/24 2212  Dressing Type Transparent;Securing device 03/13/24 2212  Dressing Status Antimicrobial disc/dressing in place;Clean, Dry, Intact 03/13/24 2212  Line Care Connections checked and tightened 03/13/24 2212  Line Adjustment (NICU/IV Team Only) No 03/13/24 2212  Dressing Intervention New dressing 03/13/24 2212  Dressing Change Due 03/20/24 03/13/24 2212       Zoe Soto, [name] 03/13/2024, 10:12 PM

## 2024-03-14 DIAGNOSIS — J4540 Moderate persistent asthma, uncomplicated: Secondary | ICD-10-CM | POA: Diagnosis present

## 2024-03-14 DIAGNOSIS — A523 Neurosyphilis, unspecified: Secondary | ICD-10-CM | POA: Diagnosis not present

## 2024-03-14 DIAGNOSIS — M2550 Pain in unspecified joint: Secondary | ICD-10-CM | POA: Diagnosis not present

## 2024-03-14 LAB — CBC
HCT: 29.3 % — ABNORMAL LOW (ref 36.0–46.0)
Hemoglobin: 8.9 g/dL — ABNORMAL LOW (ref 12.0–15.0)
MCH: 21.9 pg — ABNORMAL LOW (ref 26.0–34.0)
MCHC: 30.4 g/dL (ref 30.0–36.0)
MCV: 72.2 fL — ABNORMAL LOW (ref 80.0–100.0)
Platelets: 472 K/uL — ABNORMAL HIGH (ref 150–400)
RBC: 4.06 MIL/uL (ref 3.87–5.11)
RDW: 20.7 % — ABNORMAL HIGH (ref 11.5–15.5)
WBC: 4.9 K/uL (ref 4.0–10.5)
nRBC: 0 % (ref 0.0–0.2)

## 2024-03-14 LAB — LYME DISEASE DNA BY PCR(BORRELIA BURG): Lyme Disease(B.burgdorferi)PCR: NEGATIVE

## 2024-03-14 MED ORDER — HEPARIN SOD (PORK) LOCK FLUSH 100 UNIT/ML IV SOLN
250.0000 [IU] | INTRAVENOUS | Status: AC | PRN
  Administered 2024-03-14: 250 [IU]
  Filled 2024-03-14: qty 3

## 2024-03-14 MED ORDER — HYDROCODONE-ACETAMINOPHEN 5-325 MG PO TABS
1.0000 | ORAL_TABLET | Freq: Once | ORAL | Status: AC
  Administered 2024-03-14: 1 via ORAL
  Filled 2024-03-14: qty 1

## 2024-03-14 NOTE — Plan of Care (Signed)
   Problem: Education: Goal: Knowledge of General Education information will improve Description: Including pain rating scale, medication(s)/side effects and non-pharmacologic comfort measures Outcome: Progressing   Problem: Pain Managment: Goal: General experience of comfort will improve and/or be controlled Outcome: Progressing   Problem: Safety: Goal: Ability to remain free from injury will improve Outcome: Progressing

## 2024-03-14 NOTE — Assessment & Plan Note (Signed)
 Seen by ophthalmology for this issue a couple of weeks ago Given new diagnosis of syphilis, ophthalmology was consulted here Seen by Dr. Marcey on 11/6 No conclusive evidence of ocular involvement of syphilis Also does not appear to be c/w ocular migraine Recommend outpatient ophthalmology f/u for slit lamp exam

## 2024-03-14 NOTE — TOC Transition Note (Addendum)
 Transition of Care [facility]) - Discharge Note   Patient Details  Name: Zoe Soto MRN: [id number] Date of Birth: 12-14-1980  Transition of Care [facility]) CM/SW Contact:  Sonda Manuella Quill, RN Phone Number: 03/14/2024, 3:37 PM   Clinical Narrative:    D/C orders received; HHRN and IV antibiotics previously arranged w/ [facility]; Holley Herring at agency notified; agency contact info placed in follow up provider section of d/c instructions; no IP CM needs.   Final next level of care: Home w Home Health Services Barriers to Discharge: No Barriers Identified   Patient Goals and CMS Choice Patient states their goals for this hospitalization and ongoing recovery are:: Home with my daughter CMS Medicare.gov Compare Post Acute Care list provided to:: Patient Choice offered to / list presented to : Patient [place] ownership interest in [facility].provided to:: Parent NA    Discharge Placement                       Discharge Plan and Services Additional resources added to the After Visit Summary for     Discharge Planning Services: CM Consult            DME Arranged: N/A DME Agency: NA       HH Arranged: NA HH Agency: NA Date HH Agency Contacted: 03/13/24 Time HH Agency Contacted: 1445 Representative spoke with at [facility] Agency: Holley Herring  Social Drivers of Health (SDOH) Interventions SDOH Screenings   Food Insecurity: No Food Insecurity (03/13/2024)  Housing: Low Risk  (03/13/2024)  Recent Concern: Housing - High Risk (02/17/2024)  Transportation Needs: No Transportation Needs (03/13/2024)  Utilities: Not At Risk (03/13/2024)  Alcohol Screen: Low Risk  (02/17/2024)  Depression (PHQ2-9): Medium Risk (03/09/2024)  Financial Resource Strain: Low Risk  (02/17/2024)  Physical Activity: Inactive (02/17/2024)  Social Connections: Unknown (02/17/2024)  Recent Concern: Social Connections - Moderately Isolated (12/17/2023)  Stress: No Stress Concern Present  (02/17/2024)  Recent Concern: Stress - Stress Concern Present (12/17/2023)  Tobacco Use: Medium Risk (03/12/2024)     Readmission Risk Interventions    03/13/2024    3:06 PM  Readmission Risk Prevention Plan  Post Dischage Appt Complete  Medication Screening Complete  Transportation Screening Complete

## 2024-03-14 NOTE — Assessment & Plan Note (Addendum)
 Continue daily fluoxetine , Klonopin  as needed

## 2024-03-14 NOTE — Assessment & Plan Note (Addendum)
 Concerning for syphilis particularly neurosyphilis given the visual changes   Infectious disease consultant Dr. Montie Cleverly was consulted by ER physician and Dr. Dea is consulting now   Unsuccessful LP attempt in the ER, repeat under fluoroscopy today, labs pending MRI negative Patient reports an eye exam about 2 weeks ago but will need another exam as inpatient given new information (diagnosis of syphilis) Started on PCN G for empiric coverage of neurosyphilis She had a PICC line placed and will dc to home with OPAT

## 2024-03-14 NOTE — Assessment & Plan Note (Signed)
 Continue albuterol , Pulmicort , Flovent , Flonase 

## 2024-03-14 NOTE — Assessment & Plan Note (Signed)
On OTC supplements.

## 2024-03-14 NOTE — Assessment & Plan Note (Signed)
 likely from anemia. Recently seen by oncologist. JAK2 screen is pending.

## 2024-03-14 NOTE — Assessment & Plan Note (Addendum)
 Body mass index is 33.31 kg/m.Zoe Soto  Weight loss should be encouraged on an ongoing basis Continue Zepbound Outpatient PCP/bariatric medicine f/u encouraged Significantly low or high BMI is associated with higher medical risk including morbidity and mortality

## 2024-03-14 NOTE — Discharge Summary (Addendum)
 Physician Discharge Summary   Patient: Zoe Soto MRN: [id number] DOB: 1981-03-21  Admit date:     03/11/2024  Discharge date: 03/14/24  Discharge Physician: Delon Herald   PCP: Mahlon Comer BRAVO, MD   Recommendations at discharge:   You are being discharged with home antibiotics, Penicillin as a continuous infusion daily for 12 days Follow up with infectious disease as instructed Follow up with Dr. Mahlon next week, call for an appointment Follow up with ophthalmology; call for an appointment  Discharge Diagnoses: Principal Problem:   Polyarthralgia Active Problems:   Iron  deficiency anemia   Depressive disorder   Thrombocytosis   RPR positive   Class 1 obesity due to excess calories with body mass index (BMI) of 33.0 to 33.9 in adult   Floaters with photopsia   Moderate persistent asthma    Hospital Course: 43yo with h/o depression who presented from ID clinic with RPR 1:256 and + for FTA-ABS.  CMV titers were also recently positive.  She has had neurologic symptoms and a rash, concerning for neurosyphilis.  LP was attempted without success; IR LP ordered.  MRI normal.  ID consulted.  Assessment and Plan:  Assessment & Plan Polyarthralgia RPR positive Concerning for syphilis particularly neurosyphilis given the visual changes   Infectious disease consultant Dr. Montie Cleverly was consulted by ER physician and Dr. Dea is consulting now   Unsuccessful LP attempt in the ER, repeat under fluoroscopy today, labs pending MRI negative Patient reports an eye exam about 2 weeks ago but will need another exam as inpatient given new information (diagnosis of syphilis) Started on PCN G for empiric coverage of neurosyphilis She had a PICC line placed and will dc to home with OPAT Floaters with photopsia Seen by ophthalmology for this issue a couple of weeks ago Given new diagnosis of syphilis, ophthalmology was consulted here Seen by Dr. Marcey on 11/6 No conclusive  evidence of ocular involvement of syphilis Also does not appear to be c/w ocular migraine Recommend outpatient ophthalmology f/u for slit lamp exam Iron  deficiency anemia On OTC supplements Depressive disorder Continue daily fluoxetine , Klonopin  as needed Thrombocytosis likely from anemia. Recently seen by oncologist. JAK2 screen is pending.  Moderate persistent asthma Continue albuterol , Pulmicort , Flovent , Flonase  Class 1 obesity due to excess calories with body mass index (BMI) of 33.0 to 33.9 in adult Body mass index is 33.31 kg/m.SABRA  Weight loss should be encouraged on an ongoing basis Continue Zepbound Outpatient PCP/bariatric medicine f/u encouraged Significantly low or high BMI is associated with higher medical risk including morbidity and mortality      Consultants: ID IR TOC team   Procedures: LP 11/5 PICC line 11/7   Antibiotics: Penicillin G 11/6-   Pain control - [place]  Controlled Substance Reporting System database was reviewed. and patient was instructed, not to drive, operate heavy machinery, perform activities at heights, swimming or participation in water activities or provide baby-sitting services while on Pain, Sleep and Anxiety Medications; until their outpatient Physician has advised to do so again. Also recommended to not to take more than prescribed Pain, Sleep and Anxiety Medications.   Disposition: Home Diet recommendation:  Regular diet DISCHARGE MEDICATION: Allergies as of 03/14/2024       Reactions   Wasp Venom Anaphylaxis, Hives   Naproxen Other (See Comments)   Hematuria/blood in urine when taken in large doses   Bee Venom    Fire Ant    Lyrica  [pregabalin ] Rash, Other (See Comments)   Questionable, per  patient   Tape Rash   ADHESIVE AND PAPER TAPE REMOVES SKIN        Medication List     STOP taking these medications    fluconazole  150 MG tablet Commonly known as: DIFLUCAN    HYDROcodone -acetaminophen  5-325 MG  tablet Commonly known as: NORCO/VICODIN   Zepbound 5 MG/0.5ML Pen Generic drug: tirzepatide       TAKE these medications    albuterol  108 (90 Base) MCG/ACT inhaler Commonly known as: VENTOLIN  HFA Inhale 2 puffs into the lungs every 6 (six) hours as needed for wheezing or shortness of breath.   clonazePAM  0.5 MG disintegrating tablet Commonly known as: KLONOPIN  Take 1-2 tablets (0.5-1 mg total) by mouth 3 (three) times daily as needed (anxiety).   cyanocobalamin  1000 MCG/ML injection Commonly known as: VITAMIN B12 Inject 1 mL (1,000 mcg total) into the muscle every 30 (thirty) days.   EPINEPHrine  0.3 mg/0.3 mL Soaj injection Commonly known as: EPI-PEN Inject 0.3 mg into the muscle as needed for anaphylaxis.   Flovent  HFA 110 MCG/ACT inhaler Generic drug: fluticasone  Inhale 2 puffs into the lungs daily as needed.   FLUoxetine  20 MG capsule Commonly known as: PROZAC  TAKE 1 CAPSULE BY MOUTH EVERY DAY   FLUTICASONE  PROPIONATE (NASAL) NA Place 1-2 sprays into the nose as needed (for allergies).   penicillin G IVPB Inject 24 Million Units into the vein daily for 12 days. Indication:  Neurosyphilis First Dose: Yes Last Day of Therapy:  03/25/24 Labs - Once weekly:  CBC/D and BMP, Labs - Once weekly: ESR and CRP Method of administration: Elastomeric (Continuous infusion) Method of administration may be changed at the discretion of home infusion pharmacist based upon assessment of the patient and/or caregiver's ability to self-administer the medication ordered.   Pulmicort  Flexhaler 180 MCG/ACT inhaler Generic drug: budesonide  Inhale 2 puffs into the lungs daily as needed.   Vitamin D  (Ergocalciferol ) 1.25 MG (50000 UNIT) Caps capsule Commonly known as: DRISDOL  Take 1 capsule (50,000 Units total) by mouth every 7 (seven) days.               Discharge Care Instructions  (From admission, onward)           Start     Ordered   03/14/24 0000  Discharge wound  care:       Comments: Keep PICC line clean and dry   03/14/24 1522   03/13/24 0000  Change dressing on IV access line weekly and PRN  (Home infusion instructions - [facility] )        03/13/24 1359            Follow-up Information     Dennise Kingsley, MD Follow up.   Specialty: Infectious Diseases Contact information: [address] [place] [address] [phone]         Mahlon Comer BRAVO, MD. Schedule an appointment as soon as possible for a visit in 1 week(s).   Specialty: Family Medicine Contact information: [address] [place] [address] [phone]                Discharge Exam:   Subjective: Some headache today but feels very excited to go home.  Daughter present.   Objective: Vitals:   03/14/24 0524 03/14/24 1223  BP: (!) 131/94 (!) 124/98  Pulse: 64 70  Resp: 17 17  Temp: 97.7 F (36.5 C) 98.2 F (36.8 C)  SpO2: 100%     Intake/Output Summary (  Last 24 hours) at 03/14/2024 1522 Last data filed at 03/14/2024 1050 Gross per 24 hour  Intake 240 ml  Output --  Net 240 ml   Filed Weights   03/12/24 0809  Weight: 93.6 kg    Exam:  General:  Appears calm and comfortable and is in NAD Eyes:  normal lids, iris ENT:  grossly normal hearing, lips & tongue, mmm Cardiovascular:  RRR. No LE edema.  Respiratory:   CTA bilaterally with no wheezes/rales/rhonchi.  Normal respiratory effort. Abdomen:  soft, NT, ND Skin:  no rash or induration seen on limited exam Musculoskeletal:  grossly normal tone BUE/BLE, good ROM, no bony abnormality Psychiatric:  grossly normal mood and affect, speech fluent and appropriate, AOx3 Neurologic:  CN 2-12 grossly intact, moves all extremities in coordinated fashion  Data Reviewed: I have reviewed the patient's lab results since admission.  Pertinent labs for today include:   None today    Condition at discharge: improving  The results of significant diagnostics  from this hospitalization (including imaging, microbiology, ancillary and laboratory) are listed below for reference.   Imaging Studies: US  EKG SITE RITE Result Date: 03/13/2024 If Site Rite image not attached, placement could not be confirmed due to current cardiac rhythm.  DG Lumbar Puncture Fluoro Guide Result Date: 03/12/2024 EXAM: FLUOROSCOPIC GUIDED LUMBAR PUNCTURE 03/12/2024 12:17:00 PM FLUOROSCOPY DOSE AND TYPE: Radiation Dose Index: 16.7 mGy COMPARISON: None available CLINICAL HISTORY: Back pain. PROCEDURE: OPERATOR: Informed consent was obtained after the risks and benefits of the procedure were discussed with the patient and all questions were answered fully. Universal protocol was observed and a standard timeout was performed. The patient was positioned prone and the back was prepped and draped in the normal sterile fashion. 1% lidocaine  was used for local anesthesia. The subarachnoid space was accessed with a 22-gauge 3.5 spinal needle at the L4-5 level under fluoroscopic guidance . Free flow of clear CSF was noted. Approximately 12 ml of CSF was removed and sent for analysis. The stylet was reinserted, spinal needle was removed and brief pressure was applied at the puncture site. There were no immediate complications and the patient tolerated the procedure well. IMPRESSION: 1. Successful fluoroscopic-guided lumbar puncture. Electronically signed by: Lynwood Seip MD 03/12/2024 12:38 PM EST RP Workstation: [id number]   US  Soft Tissue Head/Neck Result Date: 03/12/2024 EXAM: US  Neck Nonvascular Soft Tissue Ultrasound 03/09/2024 06:26:30 PM TECHNIQUE: Real-time ultrasound scan of the neck with image documentation. COMPARISON: Head CT and brain MRI 03/12/2024, reported separately. CLINICAL HISTORY: 43 year old female. Palpable abnormality along the bilateral neck and groin. FINDINGS: SOFT TISSUES: In general, the background fibromuscular architecture is normal. Bilateral cervical lymph nodes are  identified which are subcentimeter short axis and have maintained normal fatty hilum architecture. The largest nodes bilaterally are up to 6 mm short axis, and 1.5 to 3 cm long axis. Some of the nodes do appear mildly hypervascular on Doppler (image 7 on the right, image 18 on the left). No other mass or fluid collection. IMPRESSION: 1. Non-malignant but reactive-appearing bilateral cervical lymph nodes. Recommended follow-up by clinical exam, with neck CT (IV contrast preferred) if any area enlarges or becomes painful. Electronically signed by: Helayne Hurst MD 03/12/2024 07:01 AM EST RP Workstation: [id number]   MR Brain W and Wo Contrast Result Date: 03/12/2024 EXAM: MRI BRAIN WITH AND WITHOUT CONTRAST 03/12/2024 06:49:10 AM TECHNIQUE: Multiplanar multisequence MRI of the head/brain was performed with and without the administration of intravenous contrast. 10 mL of gadobutrol (GADAVIST)  1 MMOL/ML injection was administered. COMPARISON: CT of the head 03/12/2024 reported separately. CLINICAL HISTORY: 43 year old female. Concern for neurosyphilis. FINDINGS: BRAIN AND VENTRICLES: Brain volume is within normal limits. The major dural venous sinuses are enhancing and appear to be patent. No abnormal intracranial enhancement. No dural thickening. No cerebral edema. Elnor and white matter signal is within normal limits for age ;minimal nonspecific white matter signal changes . No cortical encephalomalacia or chronic cerebral blood products. No acute infarct. No acute intracranial hemorrhage. No mass effect or midline shift. No hydrocephalus. The sella is unremarkable. Normal flow voids. No mass. ORBITS: No acute abnormality. SINUSES: Paranasal sinuses are clear. Mastoids are well aerated. Grossly normal visible internal auditory structures. BONES AND SOFT TISSUES: Normal visible cervical spine. Bone marrow signal is within normal limits. Partially visible reactive appearing level 2 cervical lymph nodes. Otherwise  negative visible scalp and face. IMPRESSION: 1. Normal for age MRI appearance of the brain. Electronically signed by: Helayne Hurst MD 03/12/2024 06:56 AM EST RP Workstation: [id number]   CT HEAD WO CONTRAST ( ) Result Date: 03/12/2024 EXAM: CT HEAD WITHOUT CONTRAST 03/12/2024 02:51:36 AM TECHNIQUE: CT of the head was performed without the administration of intravenous contrast. Automated exposure control, iterative reconstruction, and/or weight based adjustment of the mA/kV was utilized to reduce the radiation dose to as low as reasonably achievable. COMPARISON: CT head December 02 2023 CLINICAL HISTORY: Meningitis/CNS infection suspected. FINDINGS: BRAIN AND VENTRICLES: No acute hemorrhage. No evidence of acute infarct. No hydrocephalus. No extra-axial collection. No mass effect or midline shift. ORBITS: No acute abnormality. SINUSES: No acute abnormality. SOFT TISSUES AND SKULL: No acute soft tissue abnormality. No skull fracture. IMPRESSION: 1. No acute intracranial abnormality. Electronically signed by: Gilmore Molt MD 03/12/2024 03:13 AM EST RP Workstation: [id number]    Microbiology: Results for orders placed or performed during the hospital encounter of 03/11/24  CSF culture w Gram Stain     Status: None (Preliminary result)   Collection Time: 03/12/24 12:26 PM   Specimen: CSF; Cerebrospinal Fluid  Result Value Ref Range Status   Specimen Description   Final    CSF Performed at [facility], [address] [place], [place] [address]    Special Requests   Final    NONE Performed at [facility], [address], [place], [place] [address]    Gram Stain   Final    WBC PRESENT, PREDOMINANTLY MONONUCLEAR NO ORGANISMS SEEN CYTOSPIN SMEAR Gram Stain Report Called to,Read Back By and Verified With: SURLES,M RN ON 03/12/24 AT 1515 BY GOLSONM Performed at [facility], [address] [place], [place] [address]    Culture   Final    NO  GROWTH 2 DAYS Performed at [facility], [address] [place], [place] [address]    Report Status PENDING  Incomplete  VZV PCR, CSF     Status: None   Collection Time: 03/12/24 12:26 PM   Specimen: Lumbar Puncture; Cerebrospinal Fluid  Result Value Ref Range Status   VZV PCR, CSF Negative Negative Final    Comment: (NOTE) No Varicella Zoster Virus DNA detected. Performed At: [facility] [address] [place], [place] [id number] Jennette Shorter MD Ey:[id number]     Labs: CBC: Recent Labs  Lab 03/09/24 1416 03/11/24 1939 03/12/24 0702 03/14/24 0129  WBC 6.1 5.6 6.0 4.9  NEUTROABS 4.3 3.7 4.8  --   HGB 10.5* 10.0* 9.5* 8.9*  HCT 33.6* 33.3* 30.7* 29.3*  MCV 70.4* 71.8* 71.7* 72.2*  PLT 521* 584* 466* 472*   Basic Metabolic Panel: Recent Labs  Lab 03/11/24 1939 03/12/24 0702  NA 138 140  K 4.0 4.1  CL 106 109  CO2 22 22  GLUCOSE 85 95  BUN 13 11  CREATININE 1.12* 1.00  CALCIUM 9.5 9.0   Liver Function Tests: Recent Labs  Lab 03/11/24 1939 03/12/24 0702  AST 17 21  ALT 8 <5  ALKPHOS 60 54  BILITOT 0.4 0.4  PROT 8.2* 7.5  ALBUMIN 3.8 3.4*   CBG: No results for input(s): GLUCAP in the last 168 hours.  Discharge time spent: greater than 30 minutes.  Signed: Delon Herald, MD [facility] 03/14/2024

## 2024-03-15 ENCOUNTER — Encounter: Payer: Self-pay | Admitting: Oncology

## 2024-03-15 DIAGNOSIS — A523 Neurosyphilis, unspecified: Secondary | ICD-10-CM | POA: Diagnosis not present

## 2024-03-16 ENCOUNTER — Telehealth (INDEPENDENT_AMBULATORY_CARE_PROVIDER_SITE_OTHER): Admitting: Internal Medicine

## 2024-03-16 ENCOUNTER — Telehealth: Payer: Self-pay | Admitting: Surgery

## 2024-03-16 ENCOUNTER — Other Ambulatory Visit: Payer: Self-pay

## 2024-03-16 ENCOUNTER — Telehealth: Payer: Self-pay

## 2024-03-16 ENCOUNTER — Encounter: Payer: Self-pay | Admitting: Internal Medicine

## 2024-03-16 DIAGNOSIS — R509 Fever, unspecified: Secondary | ICD-10-CM | POA: Diagnosis not present

## 2024-03-16 DIAGNOSIS — A523 Neurosyphilis, unspecified: Secondary | ICD-10-CM | POA: Diagnosis not present

## 2024-03-16 LAB — CSF CULTURE W GRAM STAIN: Culture: NO GROWTH

## 2024-03-16 NOTE — Telephone Encounter (Signed)
 Patient reached out regarding recent hospitalization. Patient states she is doing well and has follow up scheduled already. Advised patient that she already has iron  infusions scheduled for each Friday, so her iron  infusions will just be pushed back by one week. Patient verbalized understanding and had no other concerns at this time.

## 2024-03-16 NOTE — Telephone Encounter (Signed)
 Per Dr. Dennise picc can be pulled after last dose 11/19. Community message sent to [employer] team.  Lorenda CHRISTELLA Code, RMA

## 2024-03-16 NOTE — Progress Notes (Signed)
 Virtual Visit via Telephone/Video Note   I connected with Zoe Soto   On 03/24/2024 at 7:24 AM  by Video and verified that I am speaking with the correct person using two identifiers.   I discussed the limitations, risks, security and privacy concerns of performing an evaluation and management service by telephone and the availability of in person appointments. I also discussed with the patient that there may be a patient responsible charge related to this service. The patient expressed understanding and agreed to proceed.   Location:   Patient: Home Provider: [facility]     Patient Active Problem List   Diagnosis Date Noted   Moderate persistent asthma 03/14/2024   Floaters with photopsia 03/13/2024   RPR positive 03/12/2024   Polyarthralgia 03/12/2024   Class 1 obesity due to excess calories with body mass index (BMI) of 33.0 to 33.9 in adult 03/12/2024   Thrombocytosis 03/09/2024   Lymphadenopathy 03/09/2024   Vaginal discharge 09/27/2020   Anxiety 09/26/2020   Depressive disorder 09/26/2020   Environmental and seasonal allergies 07/20/2020   Eustachian tube dysfunction 07/20/2020   Asthma    Heart palpitations 05/13/2019   Paroxysmal SVT (supraventricular tachycardia) 05/13/2019   PAC (premature atrial contraction) 05/13/2019   PVC (premature ventricular contraction) 05/13/2019   H/O laparoscopy 06/27/2018   Iron  deficiency anemia 06/04/2018   Herpes simplex virus type 2 (HSV-2) infection affecting pregnancy in third trimester 02/21/2018   Intramural leiomyoma of uterus 05/26/2013   Irregular menstruation 08/04/2010    Patient's Medications  New Prescriptions   No medications on file  Previous Medications   ALBUTEROL  (VENTOLIN  HFA) 108 (90 BASE) MCG/ACT INHALER    Inhale 2 puffs into the lungs every 6 (six) hours as needed for wheezing or shortness of breath.   BUDESONIDE  (PULMICORT  FLEXHALER) 180 MCG/ACT INHALER    Inhale 2 puffs into the lungs daily  as needed.   CLONAZEPAM  (KLONOPIN ) 0.5 MG DISINTEGRATING TABLET    Take 1-2 tablets (0.5-1 mg total) by mouth 3 (three) times daily as needed (anxiety).   CYANOCOBALAMIN  (VITAMIN B12) 1000 MCG/ML INJECTION    Inject 1 mL (1,000 mcg total) into the muscle every 30 (thirty) days.   EPINEPHRINE  0.3 MG/0.3 ML IJ SOAJ INJECTION    Inject 0.3 mg into the muscle as needed for anaphylaxis.   FLOVENT  HFA 110 MCG/ACT INHALER    Inhale 2 puffs into the lungs daily as needed.   FLUOXETINE  (PROZAC ) 20 MG CAPSULE    TAKE 1 CAPSULE BY MOUTH EVERY DAY   FLUTICASONE  (FLONASE ) 50 MCG/ACT NASAL SPRAY    Place 1 spray into both nostrils 2 (two) times daily as needed for allergies or rhinitis.   PENICILLIN G IVPB    Inject 24 Million Units into the vein daily for 12 days. Indication:  Neurosyphilis First Dose: Yes Last Day of Therapy:  03/25/24 Labs - Once weekly:  CBC/D and BMP, Labs - Once weekly: ESR and CRP Method of administration: Elastomeric (Continuous infusion) Method of administration may be changed at the discretion of home infusion pharmacist based upon assessment of the patient and/or caregiver's ability to self-administer the medication ordered.   TRIAMCINOLONE  OINTMENT (KENALOG ) 0.1 %    Apply 1 Application topically 2 (two) times daily as needed (for itching or irritation- as directed to affected sites).   VITAMIN D , ERGOCALCIFEROL , (DRISDOL ) 1.25 MG (50000 UNIT) CAPS CAPSULE    Take 1 capsule (50,000 Units total) by mouth every 7 (seven) days.  Modified Medications  No medications on file  Discontinued Medications   No medications on file    Subjective: Zoe Soto is a 43 year old female who presents with persistent symptoms following a positive CMV test.   Following a cruise on January 04, 2024, she experienced facial numbness and high blood pressure, leading to a hospital visit where she was diagnosed with a migraine. She has a history of migraines occurring once or twice a year about 16-20  years ago, but they ceased after her daughter's birth until July 2025. Sees neurology-brain mri   Upon returning from the cruise, she developed symptoms resembling strep throat, including a sore throat, white patches, and fever. Despite negative strep tests, she was treated with antibiotics. Further tests for other conditions were negative until a CMV test returned positive. She experienced these symptoms approximately one to two days after returning from the cruise.   She describes persistent body aches and joint pain, particularly in her feet, knees, and elbows, with pain levels reaching 7 out of 10 in her joints and 5 out of 10 in her body. The pain is exacerbated by cold and is present daily. She also reports swelling in her feet and ankles, making it difficult to walk.   She experiences fevers, particularly after taking hot baths, with temperatures reaching up to 101F. These fevers are not consistent every night but occur frequently after baths. Her body temperature seems to regulate when she dresses warmly before bed.   She has a history of low iron  and vitamin deficiencies, including vitamin D , which contribute to her feeling cold. She also reports a rash that started more than a month ago, which is improving but still present. The rash has been documented with photographs.   She mentions a history of yeast infections following antibiotic use and notes a recent episode coinciding with her menstrual cycle, which she attributes to perimenopause. Her periods have been irregular, with spotting and prolonged duration. Acitve with BF.    She works at a education [employer], environmental as a pensions consultant. She does not smoke or use drugs and drinks wine socially. Today 03/16/24: Discharged form hospital with IV pcn. Slight HA but not too bothersome   Review of Systems: Review of Systems  All other systems reviewed and are negative.   Past Medical History:  Diagnosis Date   Allergy    Seasonal   Anxiety    Asthma     Bee sting allergy    GERD (gastroesophageal reflux disease)    Herpes simplex virus (HSV) infection    History of chickenpox    Pneumonia 08/2017   RESOLVED    Social History   Tobacco Use   Smoking status: Former   Smokeless tobacco: Never  Vaping Use   Vaping status: Never Used  Substance Use Topics   Alcohol use: Yes    Alcohol/week: 4.0 standard drinks of alcohol    Types: 4 Glasses of wine per week    Comment: 4 glasses of wine per week    Drug use: No    Family History  Problem Relation Age of Onset   Alcohol abuse Mother    Heart attack Father 76   Heart disease Father    Asthma Brother    Diabetes Brother    Pancreatic cancer Maternal Grandmother    Diabetes Maternal Aunt    Congenital heart disease Son    Esophageal cancer Maternal Uncle    Esophageal cancer Maternal Uncle    Colon cancer Neg Hx  Stomach cancer Neg Hx     Allergies  Allergen Reactions   Bee Venom Anaphylaxis and Hives   Fire Ant Anaphylaxis, Hives and Other (See Comments)    Ants, in general   Tape Rash and Other (See Comments)    ADHESIVE AND PAPER TAPE REMOVE SKIN!!   Wasp Venom Anaphylaxis and Hives   Naproxen Other (See Comments)    Hematuria/blood in urine when taken in large doses   Lyrica  [Pregabalin ] Rash and Other (See Comments)    Possibly questionable, per patient    Health Maintenance  Topic Date Due   Pneumococcal Vaccine (1 of 2 - PCV) Never done   Mammogram  Never done   Influenza Vaccine  12/06/2023   Cervical Cancer Screening (HPV/Pap Cotest)  08/29/2026   Colonoscopy  06/01/2027   DTaP/Tdap/Td (5 - Tdap) 01/21/2031   Hepatitis B Vaccines 19-59 Average Risk  Completed   HPV VACCINES  Completed   Hepatitis C Screening  Completed   HIV Screening  Completed   Meningococcal B Vaccine  Aged Out   COVID-19 Vaccine  Discontinued    Objective:  There were no vitals filed for this visit. There is no height or weight on file to calculate BMI.  Lab  Results Lab Results  Component Value Date   WBC 4.9 03/14/2024   HGB 8.9 (L) 03/14/2024   HCT 29.3 (L) 03/14/2024   MCV 72.2 (L) 03/14/2024   PLT 472 (H) 03/14/2024    Lab Results  Component Value Date   CREATININE 1.00 03/12/2024   BUN 11 03/12/2024   NA 140 03/12/2024   K 4.1 03/12/2024   CL 109 03/12/2024   CO2 22 03/12/2024    Lab Results  Component Value Date   ALT <5 03/12/2024   AST 21 03/12/2024   ALKPHOS 54 03/12/2024   BILITOT 0.4 03/12/2024    Lab Results  Component Value Date   CHOL 151 04/05/2021   HDL 61.70 04/05/2021   LDLCALC 75 04/05/2021   TRIG 71.0 04/05/2021   CHOLHDL 2 04/05/2021   Lab Results  Component Value Date   LABRPR REACTIVE (A) 03/02/2024   RPRTITER 1:256 (H) 03/02/2024   No results found for: HIV1RNAQUANT, HIV1RNAVL, CD4TABS   Problem List Items Addressed This Visit   None  Results   Assessment/Plan #Concern for neurosyphilis with latent syphilis -LP showed 18 wbc, 11%neuro syphillis, 73% lymphs, 60 ptn 55 glc ME panel negative, vdrl pending. RPR 1:256 in serum -iv pcn till 11/19-> pull picc -notes she feels better, some HA -11/20 pcn vs doxy for latent tb tx -f/u with ID towards end of therapy   OPAT ORDERS:  Diagnosis: neurosyphiilis  Allergies  Allergen Reactions   Bee Venom Anaphylaxis and Hives   Fire Ant Anaphylaxis, Hives and Other (See Comments)    Ants, in general   Tape Rash and Other (See Comments)    ADHESIVE AND PAPER TAPE REMOVE SKIN!!   Wasp Venom Anaphylaxis and Hives   Naproxen Other (See Comments)    Hematuria/blood in urine when taken in large doses   Lyrica  [Pregabalin ] Rash and Other (See Comments)    Possibly questionable, per patient     Discharge antibiotics to be given via PICC line:  Per pharmacy protocol  Aim for Vancomycin trough 15-20 or AUC 400-550 (unless otherwise indicated)   Duration: 2 weeks End Date: 11/19  [facility] Care Per Protocol with Biopatch Use: Penicillin  24 million units daily as a continuous infusion  Labs weekly while on IV antibiotics: _x_ CBC with differential __ BMP **TWICE WEEKLY ON VANCOMYCIN  _x_ CMP __ CRP __ ESR __ Vancomycin trough TWICE WEEKLY __ CK  __x Please pull PIC at completion of IV antibiotics __ Please leave PIC in place until doctor has seen patient or been notified  Fax weekly labs to [phone]  Clinic Follow Up Appt: 11/20  @ [facility] with Judyann Casasola      Loney Stank, MD [facility] 03/16/2024, 9:43 AM  I have personally spent 35 minutes involved in face-to-face and non-face-to-face activities for this patient on the day of the visit.

## 2024-03-16 NOTE — Transitions of Care (Post Inpatient/ED Visit) (Signed)
 03/16/2024  Name: Zoe Soto MRN: [id number] DOB: 1980-12-08  Today's TOC FU Call Status: Today's TOC FU Call Status:: Successful TOC FU Call Completed TOC FU Call Complete Date: 03/16/24 Patient's Name and Date of Birth confirmed.  Transition Care Management Follow-up Telephone Call Date of Discharge: 03/14/24 Discharge Facility: [facility]) Type of Discharge: Inpatient Admission Primary Inpatient Discharge Diagnosis:: Polyarthralgia How have you been since you were released from the hospital?: Better Any questions or concerns?: No  Items Reviewed: Did you receive and understand the discharge instructions provided?: Yes Medications obtained,verified, and reconciled?: Yes (Medications Reviewed) Any new allergies since your discharge?: No Dietary orders reviewed?: NA Do you have support at home?: Yes  Medications Reviewed Today: Medications Reviewed Today     Reviewed by Lavelle Charmaine NOVAK, LPN (Licensed Practical Nurse) on 03/16/24 at 1059  Med List Status: <None>   Medication Order Taking? Sig Documenting Provider Last Dose Status Informant  albuterol  (VENTOLIN  HFA) 108 (90 Base) MCG/ACT inhaler [id number]  Inhale 2 puffs into the lungs every 6 (six) hours as needed for wheezing or shortness of breath. Tabori, Katherine E, MD  Active Self  budesonide  (PULMICORT  [facility]) 180 MCG/ACT inhaler [id number]  Inhale 2 puffs into the lungs daily as needed.  Patient taking differently: Inhale 2 puffs into the lungs daily as needed (for respiratory flares).   Kip Ade, NP  Active Self  clonazePAM  (KLONOPIN ) 0.5 MG disintegrating tablet [id number]  Take 1-2 tablets (0.5-1 mg total) by mouth 3 (three) times daily as needed (anxiety).  Patient taking differently: Take 0.5-1 mg by mouth 3 (three) times daily as needed (for anxiety - dossolve orally).   Mahlon Comer BRAVO, MD  Active Self  cyanocobalamin  (VITAMIN B12) 1000 MCG/ML injection [id number]  Inject 1 mL (1,000  mcg total) into the muscle every 30 (thirty) days. Tabori, Katherine E, MD  Active Self  EPINEPHrine  0.3 mg/0.3 mL IJ SOAJ injection [id number]  Inject 0.3 mg into the muscle as needed for anaphylaxis. Mesner, Selinda, MD  Active Self  FLOVENT  HFA 110 MCG/ACT inhaler [id number]  Inhale 2 puffs into the lungs daily as needed. Tabori, Katherine E, MD  Active   FLUoxetine  (PROZAC ) 20 MG capsule [id number]  TAKE 1 CAPSULE BY MOUTH EVERY DAY  Patient taking differently: Take 20 mg by mouth daily.   Tabori, Katherine E, MD  Active Self  fluticasone  (FLONASE ) 50 MCG/ACT nasal spray [id number]  Place 1 spray into both nostrils 2 (two) times daily as needed for allergies or rhinitis. [provider]  Active Self  penicillin G IVPB [id number]  Inject 24 Million Units into the vein daily for 12 days. Indication:  Neurosyphilis First Dose: Yes Last Day of Therapy:  03/25/24 Labs - Once weekly:  CBC/D and BMP, Labs - Once weekly: ESR and CRP Method of administration: Elastomeric (Continuous infusion) Method of administration may be changed at the discretion of home infusion pharmacist based upon assessment of the patient and/or caregiver's ability to self-administer the medication ordered. Eben Reyes BROCKS, MD  Active   triamcinolone  ointment (KENALOG ) 0.1 % [id number]  Apply 1 Application topically 2 (two) times daily as needed (for itching or irritation- as directed to affected sites). [provider]  Active Self  Vitamin D , Ergocalciferol , (DRISDOL ) 1.25 MG (50000 UNIT) CAPS capsule [id number]  Take 1 capsule (50,000 Units total) by mouth every 7 (seven) days.  Patient taking differently: Take 50,000 Units by mouth See admin instructions. Take 50,000 units by mouth once weekly- on  EITHER Tuesday OR Thursday   Mahlon Comer BRAVO, MD  Active Self            Home Care and Equipment/Supplies: Were Home Health Services Ordered?: Yes Name of Home Health Agency:: [facility] Has Agency set up a time to come to your home?: Yes Any new equipment or medical supplies ordered?: NA  Functional Questionnaire: Do you need assistance with bathing/showering or dressing?: No Do you need assistance with meal preparation?: No Do you need assistance with eating?: No Do you have difficulty maintaining continence: No Do you need assistance with getting out of bed/getting out of a chair/moving?: No Do you have difficulty managing or taking your medications?: No  Follow up appointments reviewed: PCP Follow-up appointment confirmed?: Yes Date of PCP follow-up appointment?: 03/20/24 Follow-up Provider: Dr. Mahlon Specialist [facility] Follow-up appointment confirmed?: Yes Date of Specialist follow-up appointment?: 03/26/24 Follow-Up Specialty Provider:: Infectious Disease- Dr. Dennise Do you need transportation to your follow-up appointment?: No Do you understand care options if your condition(s) worsen?: Yes-patient verbalized understanding    SIGNATURE Charmaine Bloodgood, LPN [facility] Health Advisor [place] [facility] You Are. We Are. [facility] Direct Dial [phone]

## 2024-03-17 ENCOUNTER — Encounter: Payer: Self-pay | Admitting: Internal Medicine

## 2024-03-17 ENCOUNTER — Telehealth: Payer: Self-pay

## 2024-03-17 DIAGNOSIS — A523 Neurosyphilis, unspecified: Secondary | ICD-10-CM | POA: Diagnosis not present

## 2024-03-17 DIAGNOSIS — G971 Other reaction to spinal and lumbar puncture: Secondary | ICD-10-CM

## 2024-03-17 LAB — JAK2 V617F RFX CALR/MPL/E12-15

## 2024-03-17 LAB — CALR +MPL + E12-E15  (REFLEX)

## 2024-03-17 LAB — FLOW CYTOMETRY

## 2024-03-17 NOTE — Telephone Encounter (Signed)
 Patient called asking for letter for work to excuse her through 11/21 while on iv antbx. Would like letter sent to [facility] so she can print from home.  Lorenda CHRISTELLA Code, RMA

## 2024-03-17 NOTE — Telephone Encounter (Signed)
 Approval received.   Noticed patient No showed for MRI.   Spoke to the patient, Patient was seen in The ED the day of her visit. MRI Brain W/WO Performed while she was there.

## 2024-03-18 ENCOUNTER — Ambulatory Visit (INDEPENDENT_AMBULATORY_CARE_PROVIDER_SITE_OTHER): Admitting: Internal Medicine

## 2024-03-18 ENCOUNTER — Telehealth: Payer: Self-pay | Admitting: Internal Medicine

## 2024-03-18 ENCOUNTER — Other Ambulatory Visit: Payer: Self-pay

## 2024-03-18 ENCOUNTER — Encounter: Payer: Self-pay | Admitting: Internal Medicine

## 2024-03-18 VITALS — BP 135/86 | HR 71 | Temp 98.7°F | Ht 66.0 in | Wt 200.0 lb

## 2024-03-18 DIAGNOSIS — A530 Latent syphilis, unspecified as early or late: Secondary | ICD-10-CM

## 2024-03-18 DIAGNOSIS — A523 Neurosyphilis, unspecified: Secondary | ICD-10-CM | POA: Diagnosis not present

## 2024-03-18 DIAGNOSIS — R519 Headache, unspecified: Secondary | ICD-10-CM

## 2024-03-18 LAB — MISC LABCORP TEST (SEND OUT): Labcorp test code: 55273

## 2024-03-18 LAB — JC VIRUS, PCR CSF: JC Virus PCR, CSF: NEGATIVE

## 2024-03-18 MED ORDER — DOXYCYCLINE HYCLATE 100 MG PO TABS: 100.0000 mg | ORAL_TABLET | Freq: Two times a day (BID) | ORAL | 0 refills | Status: AC

## 2024-03-18 MED ORDER — VALACYCLOVIR HCL 1 G PO TABS: 1000.0000 mg | ORAL_TABLET | Freq: Three times a day (TID) | ORAL | 0 refills | Status: AC

## 2024-03-18 NOTE — Progress Notes (Signed)
 Patient Active Problem List   Diagnosis Date Noted   Moderate persistent asthma 03/14/2024   Floaters with photopsia 03/13/2024   RPR positive 03/12/2024   Polyarthralgia 03/12/2024   Class 1 obesity due to excess calories with body mass index (BMI) of 33.0 to 33.9 in adult 03/12/2024   Thrombocytosis 03/09/2024   Lymphadenopathy 03/09/2024   Vaginal discharge 09/27/2020   Anxiety 09/26/2020   Depressive disorder 09/26/2020   Environmental and seasonal allergies 07/20/2020   Eustachian tube dysfunction 07/20/2020   Asthma    Heart palpitations 05/13/2019   Paroxysmal SVT (supraventricular tachycardia) 05/13/2019   PAC (premature atrial contraction) 05/13/2019   PVC (premature ventricular contraction) 05/13/2019   H/O laparoscopy 06/27/2018   Iron  deficiency anemia 06/04/2018   Herpes simplex virus type 2 (HSV-2) infection affecting pregnancy in third trimester 02/21/2018   Intramural leiomyoma of uterus 05/26/2013   Irregular menstruation 08/04/2010    Patient's Medications  New Prescriptions   No medications on file  Previous Medications   ALBUTEROL  (VENTOLIN  HFA) 108 (90 BASE) MCG/ACT INHALER    Inhale 2 puffs into the lungs every 6 (six) hours as needed for wheezing or shortness of breath.   BUDESONIDE  (PULMICORT  FLEXHALER) 180 MCG/ACT INHALER    Inhale 2 puffs into the lungs daily as needed.   CLONAZEPAM  (KLONOPIN ) 0.5 MG DISINTEGRATING TABLET    Take 1-2 tablets (0.5-1 mg total) by mouth 3 (three) times daily as needed (anxiety).   CYANOCOBALAMIN  (VITAMIN B12) 1000 MCG/ML INJECTION    Inject 1 mL (1,000 mcg total) into the muscle every 30 (thirty) days.   EPINEPHRINE  0.3 MG/0.3 ML IJ SOAJ INJECTION    Inject 0.3 mg into the muscle as needed for anaphylaxis.   FLOVENT  HFA 110 MCG/ACT INHALER    Inhale 2 puffs into the lungs daily as needed.   FLUOXETINE  (PROZAC ) 20 MG CAPSULE    TAKE 1 CAPSULE BY MOUTH EVERY DAY   FLUTICASONE  (FLONASE ) 50 MCG/ACT NASAL SPRAY     Place 1 spray into both nostrils 2 (two) times daily as needed for allergies or rhinitis.   PENICILLIN  G IVPB    Inject 24 Million Units into the vein daily for 12 days. Indication:  Neurosyphilis First Dose: Yes Last Day of Therapy:  03/25/24 Labs - Once weekly:  CBC/D and BMP, Labs - Once weekly: ESR and CRP Method of administration: Elastomeric (Continuous infusion) Method of administration may be changed at the discretion of home infusion pharmacist based upon assessment of the patient and/or caregiver's ability to self-administer the medication ordered.   TRIAMCINOLONE  OINTMENT (KENALOG ) 0.1 %    Apply 1 Application topically 2 (two) times daily as needed (for itching or irritation- as directed to affected sites).   VITAMIN D , ERGOCALCIFEROL , (DRISDOL ) 1.25 MG (50000 UNIT) CAPS CAPSULE    Take 1 capsule (50,000 Units total) by mouth every 7 (seven) days.  Modified Medications   No medications on file  Discontinued Medications   No medications on file    Subjective: 43 YF on treatment for neurosyphlllis on penicillin  x 2 weeks  till 11/19 presents for HA. CSF : wbc 78 rbc, wbc 17, ptn 60, glc 55 Cx ng, ME panel negative, lyme negative, vzv negative jc negative, MRI showed no acute abnormality. PT was initillay referered for HA . She sttes she has HA all the time since she left the hospital, Notes ear pressure. Frontal, perorbirtal , she states its due to pressure. GO  to fri HPI: Following a cruise on January 04, 2024, she experienced facial numbness and high blood pressure, leading to a hospital visit where she was diagnosed with a migraine. She has a history of migraines occurring once or twice a year about 16-20 years ago, but they ceased after her daughter's birth until July 2025. Sees neurology-brain mri   Upon returning from the cruise, she developed symptoms resembling strep throat, including a sore throat, white patches, and fever. Despite negative strep tests, she was treated with  antibiotics. Further tests for other conditions were negative until a CMV test returned positive. She experienced these symptoms approximately one to two days after returning from the cruise.   She describes persistent body aches and joint pain, particularly in her feet, knees, and elbows, with pain levels reaching 7 out of 10 in her joints and 5 out of 10 in her body. The pain is exacerbated by cold and is present daily. She also reports swelling in her feet and ankles, making it difficult to walk.   She experiences fevers, particularly after taking hot baths, with temperatures reaching up to 101F. These fevers are not consistent every night but occur frequently after baths. Her body temperature seems to regulate when she dresses warmly before bed.   She has a history of low iron  and vitamin deficiencies, including vitamin D , which contribute to her feeling cold. She also reports a rash that started more than a month ago, which is improving but still present. The rash has been documented with photographs.   She mentions a history of yeast infections following antibiotic use and notes a recent episode coinciding with her menstrual cycle, which she attributes to perimenopause. Her periods have been irregular, with spotting and prolonged duration. Acitve with BF.    She works at a education [employer], environmental as a pensions consultant. She does not smoke or use drugs and drinks wine socially.  Today: reports worsening HA since leaving the hospital. She feels ok laying down. Feels wore when she moves. Does not feel like normal migraines.  Review of Systems: Review of Systems  All other systems reviewed and are negative.   Past Medical History:  Diagnosis Date   Allergy    Seasonal   Anxiety    Asthma    Bee sting allergy    GERD (gastroesophageal reflux disease)    Herpes simplex virus (HSV) infection    History of chickenpox    Pneumonia 08/2017   RESOLVED    Social History   Tobacco Use   Smoking status:  Former   Smokeless tobacco: Never  Vaping Use   Vaping status: Never Used  Substance Use Topics   Alcohol use: Yes    Alcohol/week: 4.0 standard drinks of alcohol    Types: 4 Glasses of wine per week    Comment: 4 glasses of wine per week    Drug use: No    Family History  Problem Relation Age of Onset   Alcohol abuse Mother    Heart attack Father 55   Heart disease Father    Asthma Brother    Diabetes Brother    Pancreatic cancer Maternal Grandmother    Diabetes Maternal Aunt    Congenital heart disease Son    Esophageal cancer Maternal Uncle    Esophageal cancer Maternal Uncle    Colon cancer Neg Hx    Stomach cancer Neg Hx     Allergies  Allergen Reactions   Bee Venom Anaphylaxis and Hives   Fire  Ant Anaphylaxis, Hives and Other (See Comments)    Ants, in general   Tape Rash and Other (See Comments)    ADHESIVE AND PAPER TAPE REMOVE SKIN!!   Wasp Venom Anaphylaxis and Hives   Naproxen Other (See Comments)    Hematuria/blood in urine when taken in large doses   Lyrica  [Pregabalin ] Rash and Other (See Comments)    Possibly questionable, per patient    Health Maintenance  Topic Date Due   Pneumococcal Vaccine (1 of 2 - PCV) Never done   Mammogram  Never done   Influenza Vaccine  12/06/2023   Cervical Cancer Screening (HPV/Pap Cotest)  08/29/2026   Colonoscopy  06/01/2027   DTaP/Tdap/Td (5 - Tdap) 01/21/2031   Hepatitis B Vaccines 19-59 Average Risk  Completed   HPV VACCINES  Completed   Hepatitis C Screening  Completed   HIV Screening  Completed   Meningococcal B Vaccine  Aged Out   COVID-19 Vaccine  Discontinued    Objective:  There were no vitals filed for this visit. There is no height or weight on file to calculate BMI.  Physical Exam HENT:     Head: Normocephalic and atraumatic.     Right Ear: Tympanic membrane normal.     Left Ear: Tympanic membrane normal.     Nose: Nose normal.     Mouth/Throat:     Mouth: Mucous membranes are moist.   Eyes:     Extraocular Movements: Extraocular movements intact.     Conjunctiva/sclera: Conjunctivae normal.     Pupils: Pupils are equal, round, and reactive to light.  Cardiovascular:     Rate and Rhythm: Normal rate and regular rhythm.     Heart sounds: No murmur heard.    No friction rub. No gallop.  Pulmonary:     Effort: Pulmonary effort is normal.     Breath sounds: Normal breath sounds.  Abdominal:     General: Abdomen is flat.     Palpations: Abdomen is soft.  Musculoskeletal:        General: Normal range of motion.  Skin:    General: Skin is warm and dry.  Neurological:     General: No focal deficit present.     Mental Status: She is alert and oriented to person, place, and time.  Psychiatric:        Mood and Affect: Mood normal.    Lab Results Lab Results  Component Value Date   WBC 4.9 03/14/2024   HGB 8.9 (L) 03/14/2024   HCT 29.3 (L) 03/14/2024   MCV 72.2 (L) 03/14/2024   PLT 472 (H) 03/14/2024    Lab Results  Component Value Date   CREATININE 1.00 03/12/2024   BUN 11 03/12/2024   NA 140 03/12/2024   K 4.1 03/12/2024   CL 109 03/12/2024   CO2 22 03/12/2024    Lab Results  Component Value Date   ALT <5 03/12/2024   AST 21 03/12/2024   ALKPHOS 54 03/12/2024   BILITOT 0.4 03/12/2024    Lab Results  Component Value Date   CHOL 151 04/05/2021   HDL 61.70 04/05/2021   LDLCALC 75 04/05/2021   TRIG 71.0 04/05/2021   CHOLHDL 2 04/05/2021   Lab Results  Component Value Date   LABRPR REACTIVE (A) 03/02/2024   RPRTITER 1:256 (H) 03/02/2024   No results found for: HIV1RNAQUANT, HIV1RNAVL, CD4TABS   Problem List Items Addressed This Visit   None  Results   Assessment/Plan #Concern for neurosyphilis with latent  syphilis -LP showed 18 wbc, 11%neuro syphillis, 73% lymphs, 60 ptn 55 glc ME panel negative, vdrl pending. RPR 1:256 in serum #Worsening headache - Patient reports worsening headaches since discharge.  She states her headaches  get worse when she moves around.  Does not feel like her typical migraine. - Counseled to follow-up with neurology - Will switch penicillin  to ceftriaxone although she is later in the course to have JHR reaction.  Discussed medication change and pharmacy - OPAT orders in a separate note - Start Valtrex  x 10 days if no improvement by Friday, although ME panel was negative F/U on 11/20, start Doxy for treatment of latent syphilis following completion of ceftriaxone    Loney Stank, MD [facility] 03/18/2024, 2:39 PM   I personally spent a total of 60 minutes in the care of the patient today including preparing to see the patient, getting/reviewing separately obtained history, performing a medically appropriate exam/evaluation, counseling and educating, placing orders, referring and communicating with other health care professionals, documenting clinical information in the EHR, and independently interpreting results.

## 2024-03-18 NOTE — Patient Instructions (Addendum)
 Stop penicillin and start ceftriaxone Start doxy twice a day on 11/20 Start valtrex  if HA does not improve by Friday F/U with ID on 11/20 F/U with neurology

## 2024-03-18 NOTE — Telephone Encounter (Signed)
 Sent message to Holley Herring RN at [facility] about opat IV abx change.

## 2024-03-18 NOTE — Telephone Encounter (Signed)
 Stop Penicillin, start ceftriaxone  OPAT ORDERS:  Diagnosis: Neurosyphilis  Allergies  Allergen Reactions   Bee Venom Anaphylaxis and Hives   Fire Ant Anaphylaxis, Hives and Other (See Comments)    Ants, in general   Tape Rash and Other (See Comments)    ADHESIVE AND PAPER TAPE REMOVE SKIN!!   Wasp Venom Anaphylaxis and Hives   Naproxen Other (See Comments)    Hematuria/blood in urine when taken in large doses   Lyrica  [Pregabalin ] Rash and Other (See Comments)    Possibly questionable, per patient     Discharge antibiotics to be given via PICC line:  Per pharmacy protocol ceftiraxone 2gm per day    End Date: 11/19  [facility] Care Per Protocol with Biopatch Use: Home health RN for IV administration and teaching, line care and labs.    Labs weekly while on IV antibiotics: x__ CBC with differential __ BMP **TWICE WEEKLY ON VANCOMYCIN  __x CMP __ CRP __ ESR __ Vancomycin trough TWICE WEEKLY __ CK  _xx_ Please pull PIC at completion of IV antibiotics __ Please leave PIC in place until doctor has seen patient or been notified  Fax weekly labs to [phone]  Clinic Follow Up Appt: 11/20  @ [facility] with 11/20

## 2024-03-18 NOTE — Telephone Encounter (Signed)
 Patient called to schedule appt regarding pressure in head and ear popping. Will come in today at [address] [place], [place]

## 2024-03-19 ENCOUNTER — Telehealth: Payer: Self-pay | Admitting: Neurology

## 2024-03-19 DIAGNOSIS — A523 Neurosyphilis, unspecified: Secondary | ICD-10-CM | POA: Diagnosis not present

## 2024-03-19 NOTE — Telephone Encounter (Signed)
 MRI is normal. She was in the hospital for workup of neurosyphilis. The test in the spinal fluid specifically for syphilis is still pending. When I saw her, she no longer had a headache but she has told the ID doctor that she has had a headache since leaving the hospital. Sometimes people develop a headache after getting a spinal tap. Is the headache better when she lies down but worse when she is upright? If there is a significant change with position, we can order a blood patch where they draw some of her blood and inject it in the lower back where they stuck the needle for the spinal tap, in order to patch up the remaining hole that may be sill leaking spinal fluid and causing the headache.    Per patient yes, the headache is really bad when she stands. When she is laying she is good. Over the last couple of days it has gotten worse.   Patient wants to know if getting he Blood patch done will the headache go away right away?     Ceftriaxone was prescribed this by Dennise Kingsley

## 2024-03-19 NOTE — Telephone Encounter (Signed)
 Pt cld back Zoe Soto, no reason given

## 2024-03-19 NOTE — Telephone Encounter (Signed)
 Patient advised of Blood Patch order.

## 2024-03-20 ENCOUNTER — Inpatient Hospital Stay

## 2024-03-20 ENCOUNTER — Inpatient Hospital Stay: Admitting: Family Medicine

## 2024-03-20 ENCOUNTER — Other Ambulatory Visit: Payer: Self-pay | Admitting: Medical Genetics

## 2024-03-20 ENCOUNTER — Ambulatory Visit
Admission: RE | Admit: 2024-03-20 | Discharge: 2024-03-20 | Disposition: A | Discharge status: Home / Self Care | Source: Ambulatory Visit | Attending: Neurology | Admitting: Neurology

## 2024-03-20 ENCOUNTER — Encounter: Payer: Self-pay | Admitting: Oncology

## 2024-03-20 DIAGNOSIS — G971 Other reaction to spinal and lumbar puncture: Secondary | ICD-10-CM

## 2024-03-20 DIAGNOSIS — A523 Neurosyphilis, unspecified: Secondary | ICD-10-CM | POA: Diagnosis not present

## 2024-03-20 MED ORDER — IOPAMIDOL (ISOVUE-M 200) INJECTION 41%
1.0000 mL | Freq: Once | INTRAMUSCULAR | Status: AC
  Administered 2024-03-20: 1 mL via EPIDURAL

## 2024-03-20 MED ORDER — DIAZEPAM 5 MG PO TABS
10.0000 mg | ORAL_TABLET | Freq: Once | ORAL | Status: AC
  Administered 2024-03-20: 10 mg via ORAL

## 2024-03-20 NOTE — Discharge Instructions (Signed)
 Blood Patch Discharge Instructions ? ?Go home and rest quietly for the next 24 hours.  It is important to lie flat for the next 24 hours.  Get up only to go to the restroom.  You may lie in the bed or on a couch on your back, your stomach, your left side or your right side.  You may have one pillow under your head.  You may have pillows between your knees while you are on your side or under your knees while you are on your back. ? ?DO NOT drive today.  Recline the seat as far back as it will go, while still wearing your seat belt, on the way home. ? ?You may get up to go to the bathroom as needed.  You may sit up for 10 minutes to eat.  You may resume your normal diet and medications unless otherwise indicated.  Drink lots of extra fluids today and tomorrow..  ? ?You may resume normal activities after your 24 hours of bed rest is over; however, do not exert yourself strongly or do any heavy lifting tomorrow. ? ?Call your physician for a follow-up appointment.  ? ?If you have any questions  after you arrive home, please call [phone]. ? ?Discharge instructions have been explained to the patient.  The patient, or the person responsible for the patient, fully understands these instructions. ? ?   ?

## 2024-03-21 DIAGNOSIS — A523 Neurosyphilis, unspecified: Secondary | ICD-10-CM | POA: Diagnosis not present

## 2024-03-22 DIAGNOSIS — A523 Neurosyphilis, unspecified: Secondary | ICD-10-CM | POA: Diagnosis not present

## 2024-03-23 ENCOUNTER — Encounter: Payer: Self-pay | Admitting: Oncology

## 2024-03-23 DIAGNOSIS — F419 Anxiety disorder, unspecified: Secondary | ICD-10-CM | POA: Diagnosis not present

## 2024-03-23 DIAGNOSIS — A523 Neurosyphilis, unspecified: Secondary | ICD-10-CM | POA: Diagnosis not present

## 2024-03-23 LAB — VDRL, CSF: VDRL Quant, CSF: NONREACTIVE

## 2024-03-24 ENCOUNTER — Telehealth: Payer: Self-pay

## 2024-03-24 ENCOUNTER — Inpatient Hospital Stay

## 2024-03-24 DIAGNOSIS — A523 Neurosyphilis, unspecified: Secondary | ICD-10-CM | POA: Diagnosis not present

## 2024-03-24 NOTE — Telephone Encounter (Signed)
 Pt notified per Dr. Autumn that she will need to be finished with IV antibiotics prior to starting her iron  infusions here but IT IS OK TO BE ON PO ANTIBIOTICS when starting infusions. Her first appt is next week on 11/26 and she will keep that appointment unless her IV antibiotics are continued for some reason.

## 2024-03-25 DIAGNOSIS — A523 Neurosyphilis, unspecified: Secondary | ICD-10-CM | POA: Diagnosis not present

## 2024-03-26 ENCOUNTER — Encounter: Payer: Self-pay | Admitting: Internal Medicine

## 2024-03-26 ENCOUNTER — Other Ambulatory Visit: Payer: Self-pay

## 2024-03-26 ENCOUNTER — Telehealth: Payer: Self-pay

## 2024-03-26 ENCOUNTER — Ambulatory Visit (INDEPENDENT_AMBULATORY_CARE_PROVIDER_SITE_OTHER): Payer: Self-pay | Admitting: Internal Medicine

## 2024-03-26 ENCOUNTER — Encounter: Payer: Self-pay | Admitting: Oncology

## 2024-03-26 VITALS — BP 147/74 | HR 72 | Temp 97.1°F | Resp 16 | Wt 200.0 lb

## 2024-03-26 DIAGNOSIS — A530 Latent syphilis, unspecified as early or late: Secondary | ICD-10-CM

## 2024-03-26 DIAGNOSIS — A523 Neurosyphilis, unspecified: Secondary | ICD-10-CM | POA: Diagnosis not present

## 2024-03-26 NOTE — Telephone Encounter (Signed)
 Dr.Singh spoke to RN with patient today -- RN will go out this afternoon to pull PICC line.    Zoe Soto SHAUNNA Letters, CMA

## 2024-03-26 NOTE — Progress Notes (Signed)
 Patient: Zoe Soto  DOB: 1981-03-24 MRN: [id number] PCP: Mahlon Comer BRAVO, MD    Chief Complaint  Patient presents with   Hospitalization Follow-up    Fever -- patient reports she is feeling amazing.      Patient Active Problem List   Diagnosis Date Noted   Moderate persistent asthma 03/14/2024   Floaters with photopsia 03/13/2024   RPR positive 03/12/2024   Polyarthralgia 03/12/2024   Class 1 obesity due to excess calories with body mass index (BMI) of 33.0 to 33.9 in adult 03/12/2024   Thrombocytosis 03/09/2024   Lymphadenopathy 03/09/2024   Vaginal discharge 09/27/2020   Anxiety 09/26/2020   Depressive disorder 09/26/2020   Environmental and seasonal allergies 07/20/2020   Eustachian tube dysfunction 07/20/2020   Asthma    Heart palpitations 05/13/2019   Paroxysmal SVT (supraventricular tachycardia) 05/13/2019   PAC (premature atrial contraction) 05/13/2019   PVC (premature ventricular contraction) 05/13/2019   H/O laparoscopy 06/27/2018   Iron  deficiency anemia 06/04/2018   Herpes simplex virus type 2 (HSV-2) infection affecting pregnancy in third trimester 02/21/2018   Intramural leiomyoma of uterus 05/26/2013   Irregular menstruation 08/04/2010     Subjective:  Zoe Soto is a 43 YF on treatment for neurosyphlllis on penicillin  x 2 weeks  till 11/19 presents for HA. CSF : wbc 78 rbc, wbc 17, ptn 60, glc 55 Cx ng, ME panel negative, lyme negative, vzv negative jc negative, MRI showed no acute abnormality. PT was initillay referered for HA . She sttes she has HA all the time since she left the hospital, Notes ear pressure. Frontal, perorbirtal , she states its due to pressure. GO to fri HPI: Following a cruise on January 04, 2024, she experienced facial numbness and high blood pressure, leading to a hospital visit where she was diagnosed with a migraine. She has a history of migraines occurring once or twice a year about 16-20 years ago,  but they ceased after her daughter's birth until July 2025. Sees neurology-brain mri   Upon returning from the cruise, she developed symptoms resembling strep throat, including a sore throat, white patches, and fever. Despite negative strep tests, she was treated with antibiotics. Further tests for other conditions were negative until a CMV test returned positive. She experienced these symptoms approximately one to two days after returning from the cruise.   She describes persistent body aches and joint pain, particularly in her feet, knees, and elbows, with pain levels reaching 7 out of 10 in her joints and 5 out of 10 in her body. The pain is exacerbated by cold and is present daily. She also reports swelling in her feet and ankles, making it difficult to walk.   She experiences fevers, particularly after taking hot baths, with temperatures reaching up to 101F. These fevers are not consistent every night but occur frequently after baths. Her body temperature seems to regulate when she dresses warmly before bed.   She has a history of low iron  and vitamin deficiencies, including vitamin D , which contribute to her feeling cold. She also reports a rash that started more than a month ago, which is improving but still present. The rash has been documented with photographs.   She mentions a history of yeast infections following antibiotic use and notes a recent episode coinciding with her menstrual cycle, which she attributes to perimenopause. Her periods have been irregular, with spotting and prolonged duration. Acitve with BF.    She works at a education [employer], environmental  as a pensions consultant. She does not smoke or use drugs and drinks wine socially.  Today: reports worsening HA since leaving the hospital. She feels ok laying down. Feels wore when she moves. Does not feel like normal migraines.   Seen by Neruolgy on 11/13 had blood patch for csf leak, HA have resolved  after blood  patch. Did not start valtrex .  Review of  Systems  All other systems reviewed and are negative.   Past Medical History:  Diagnosis Date   Allergy    Seasonal   Anxiety    Asthma    Bee sting allergy    GERD (gastroesophageal reflux disease)    Herpes simplex virus (HSV) infection    History of chickenpox    Pneumonia 08/2017   RESOLVED    Outpatient Medications Prior to Visit  Medication Sig Dispense Refill   albuterol  (VENTOLIN  HFA) 108 (90 Base) MCG/ACT inhaler Inhale 2 puffs into the lungs every 6 (six) hours as needed for wheezing or shortness of breath. 8 g 0   budesonide  (PULMICORT  FLEXHALER) 180 MCG/ACT inhaler Inhale 2 puffs into the lungs daily as needed. (Patient taking differently: Inhale 2 puffs into the lungs daily as needed (for respiratory flares).) 1 each 12   clonazePAM  (KLONOPIN ) 0.5 MG disintegrating tablet Take 1-2 tablets (0.5-1 mg total) by mouth 3 (three) times daily as needed (anxiety). (Patient taking differently: Take 0.5-1 mg by mouth 3 (three) times daily as needed (for anxiety - dossolve orally).) 60 tablet 1   cyanocobalamin  (VITAMIN B12) 1000 MCG/ML injection Inject 1 mL (1,000 mcg total) into the muscle every 30 (thirty) days. 10 mL 0   doxycycline  (VIBRA -TABS) 100 MG tablet Take 1 tablet (100 mg total) by mouth 2 (two) times daily for 28 days. 56 tablet 0   EPINEPHrine  0.3 mg/0.3 mL IJ SOAJ injection Inject 0.3 mg into the muscle as needed for anaphylaxis. 1 each 1   FLOVENT  HFA 110 MCG/ACT inhaler Inhale 2 puffs into the lungs daily as needed. 1 each 12   FLUoxetine  (PROZAC ) 20 MG capsule TAKE 1 CAPSULE BY MOUTH EVERY DAY (Patient taking differently: Take 20 mg by mouth daily.) 90 capsule 1   fluticasone  (FLONASE ) 50 MCG/ACT nasal spray Place 1 spray into both nostrils 2 (two) times daily as needed for allergies or rhinitis.     triamcinolone  ointment (KENALOG ) 0.1 % Apply 1 Application topically 2 (two) times daily as needed (for itching or irritation- as directed to affected sites).      valACYclovir  (VALTREX ) 1000 MG tablet Take 1 tablet (1,000 mg total) by mouth 3 (three) times daily for 10 days. 30 tablet 0   Vitamin D , Ergocalciferol , (DRISDOL ) 1.25 MG (50000 UNIT) CAPS capsule Take 1 capsule (50,000 Units total) by mouth every 7 (seven) days. (Patient taking differently: Take 50,000 Units by mouth See admin instructions. Take 50,000 units by mouth once weekly- on EITHER Tuesday OR Thursday) 12 capsule 0   No facility-administered medications prior to visit.     Allergies  Allergen Reactions   Bee Venom Anaphylaxis and Hives   Fire Ant Anaphylaxis, Hives and Other (See Comments)    Ants, in general   Tape Rash and Other (See Comments)    ADHESIVE AND PAPER TAPE REMOVE SKIN!!   Wasp Venom Anaphylaxis and Hives   Naproxen Other (See Comments)    Hematuria/blood in urine when taken in large doses   Lyrica  [Pregabalin ] Rash and Other (See Comments)    Possibly questionable, per patient  Social History   Tobacco Use   Smoking status: Former   Smokeless tobacco: Never  Vaping Use   Vaping status: Never Used  Substance Use Topics   Alcohol use: Yes    Alcohol/week: 4.0 standard drinks of alcohol    Types: 4 Glasses of wine per week    Comment: 4 glasses of wine per week    Drug use: No    Family History  Problem Relation Age of Onset   Alcohol abuse Mother    Heart attack Father 33   Heart disease Father    Asthma Brother    Diabetes Brother    Pancreatic cancer Maternal Grandmother    Diabetes Maternal Aunt    Congenital heart disease Son    Esophageal cancer Maternal Uncle    Esophageal cancer Maternal Uncle    Colon cancer Neg Hx    Stomach cancer Neg Hx     Objective:   Vitals:   03/26/24 0859  Weight: 200 lb (90.7 kg)   Body mass index is 32.28 kg/m.  Physical Exam Constitutional:      Appearance: Normal appearance.  HENT:     Head: Normocephalic and atraumatic.     Right Ear: Tympanic membrane normal.     Left Ear: Tympanic  membrane normal.     Nose: Nose normal.     Mouth/Throat:     Mouth: Mucous membranes are moist.  Eyes:     Extraocular Movements: Extraocular movements intact.     Conjunctiva/sclera: Conjunctivae normal.     Pupils: Pupils are equal, round, and reactive to light.  Cardiovascular:     Rate and Rhythm: Normal rate and regular rhythm.     Heart sounds: No murmur heard.    No friction rub. No gallop.  Pulmonary:     Effort: Pulmonary effort is normal.     Breath sounds: Normal breath sounds.  Abdominal:     General: Abdomen is flat.     Palpations: Abdomen is soft.  Musculoskeletal:        General: Normal range of motion.  Skin:    General: Skin is warm and dry.  Neurological:     General: No focal deficit present.     Mental Status: She is alert and oriented to person, place, and time.  Psychiatric:        Mood and Affect: Mood normal.     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 RPR 1:256 in serum #Worsening headache - Patient reports worsening headaches since discharge.  She states her headaches get worse when she moves around.  Does not feel like her typical migraine.  At last visit presumable changes of her increased levels contributing to her headaches.  She has subsequently  been seen by neurology, found to have possible CSF leak and blood patch placed.   Patient states headaches resolved after blood patch.  Did not start Valtrex . - Ceftriaxone due to be completed today.,  Communicated with home health they will come by later to pull PICC. - Start Doxy tomorrow x 28 days for latent syphilis  -Follow-up with infectious disease in one month   Loney Stank, MD [facility]   03/26/24  9:01 AM  I personally spent a total of 32 minutes in the care of the patient today including preparing to see the patient, getting/reviewing separately obtained history, performing a medically appropriate exam/evaluation, counseling and educating, referring and communicating with other health care professionals, documenting clinical information in the EHR, independently interpreting results, communicating results, and coordinating care.

## 2024-03-27 ENCOUNTER — Ambulatory Visit (INDEPENDENT_AMBULATORY_CARE_PROVIDER_SITE_OTHER): Admitting: Family Medicine

## 2024-03-27 ENCOUNTER — Inpatient Hospital Stay

## 2024-03-27 ENCOUNTER — Encounter: Payer: Self-pay | Admitting: Family Medicine

## 2024-03-27 VITALS — BP 108/64 | HR 71 | Temp 97.9°F | Resp 16 | Ht 66.0 in | Wt 204.1 lb

## 2024-03-27 DIAGNOSIS — A523 Neurosyphilis, unspecified: Secondary | ICD-10-CM

## 2024-03-27 DIAGNOSIS — E538 Deficiency of other specified B group vitamins: Secondary | ICD-10-CM

## 2024-03-27 MED ORDER — TIRZEPATIDE-WEIGHT MANAGEMENT 7.5 MG/0.5ML ~~LOC~~ SOLN: 7.5000 mg | SUBCUTANEOUS | Status: DC

## 2024-03-27 MED ORDER — CYANOCOBALAMIN 1000 MCG/ML IJ SOLN
1000.0000 ug | Freq: Once | INTRAMUSCULAR | Status: AC
  Administered 2024-03-27: 1000 ug via INTRAMUSCULAR

## 2024-03-27 NOTE — Progress Notes (Unsigned)
   Subjective:    Patient ID: Zoe Soto, female    DOB: Dec 20, 1980, 43 y.o.   MRN: [id number]  HPI Hospital f/u- pt was admitted 11/5-11/8 after having + RPR and FTA-ABS w/ neurologic sxs and a rash that were concerning for neurosyphilis.  MRI was normal, ID involved.  LP done.  Was started on abx for syphilis.  Had PICC line for home abx.  Had inpt eye exam that did not show ocular involvement but outpt f/u was recommended for slit lamp exam.  Had ID f/u yesterday and decision was made to pull PICC line.  She is going to start outpt Doxycycline  tx.  Saw Neuro on 11/13 for ongoing HA after LP and had a blood patch.  HA resolved.  Continues to have dizziness- plan is for iron  infusions to start next week.  Otherwise reports feeling well.  No longer having fevers and body aches.   Review of Systems For ROS see HPI     Objective:   Physical Exam Vitals reviewed.  Constitutional:      General: She is not in acute distress.    Appearance: Normal appearance. She is well-developed. She is not ill-appearing.  HENT:     Head: Normocephalic and atraumatic.  Eyes:     Conjunctiva/sclera: Conjunctivae normal.     Pupils: Pupils are equal, round, and reactive to light.  Neck:     Thyroid : No thyromegaly.  Cardiovascular:     Rate and Rhythm: Normal rate and regular rhythm.     Heart sounds: Normal heart sounds. No murmur heard. Pulmonary:     Effort: Pulmonary effort is normal. No respiratory distress.     Breath sounds: Normal breath sounds.  Abdominal:     General: There is no distension.     Palpations: Abdomen is soft.     Tenderness: There is no abdominal tenderness.  Musculoskeletal:     Cervical back: Normal range of motion and neck supple.  Lymphadenopathy:     Cervical: No cervical adenopathy.  Skin:    General: Skin is warm and dry.  Neurological:     General: No focal deficit present.     Mental Status: She is alert and oriented to person, place, and time.   Psychiatric:        Mood and Affect: Mood normal.        Behavior: Behavior normal.        Thought Content: Thought content normal.           Assessment & Plan:

## 2024-03-27 NOTE — Patient Instructions (Signed)
 Schedule your complete physical in 3 months No need to do labs today- you're certainly had enough! Keep up the good work!  You look great! Call with any questions or concerns Stay Safe!  Stay Healthy! I am SO proud of you!!! Happy Holidays!!!

## 2024-03-29 DIAGNOSIS — A523 Neurosyphilis, unspecified: Secondary | ICD-10-CM | POA: Insufficient documentation

## 2024-03-29 NOTE — Assessment & Plan Note (Signed)
 New.  Reviewed ID notes, hospital H&P, labs, imaging.  Pt is asymptomatic today and feeling much better.  She is emotionally traumatized that this is the cause of her sxs and that she got this from her now deceased husband.  She is in therapy and thankfully her long term current partner is negative.  She reports good support from family and friends.  Finish abx per ID.  No changes at this time.

## 2024-03-30 ENCOUNTER — Telehealth: Payer: Self-pay

## 2024-03-30 NOTE — Telephone Encounter (Signed)
 Noted. Called again and was able to get connected

## 2024-03-30 NOTE — Telephone Encounter (Signed)
 A52.3 is the code for Neurosyphilis

## 2024-03-30 NOTE — Telephone Encounter (Signed)
 Called pt and she states ok to speak with anyone from [facility]  Called number given for [facility], states the are experience high call volume and unable to take call at this time.  Attached PCP in this message for dx code.   This information can be given at time of call back

## 2024-03-30 NOTE — Telephone Encounter (Signed)
 Copied from CRM [id number]. Topic: General - Other >> Mar 30, 2024  1:06 PM Rea ORN wrote: Reason for CRM: [facility] with [facility] called to verify pt last appt, upcoming appt and asked if there was a diagnosis code for pt having Neurosyphilis.   [facility] also wanted to advise PCP that she does not have to fill out physician form that they faxed to the office. If there are any additional questions please call [phone], claim # [id number]

## 2024-04-01 ENCOUNTER — Inpatient Hospital Stay

## 2024-04-01 VITALS — BP 134/92 | HR 61 | Temp 98.1°F | Resp 17 | Wt 205.0 lb

## 2024-04-01 DIAGNOSIS — M25471 Effusion, right ankle: Secondary | ICD-10-CM | POA: Diagnosis not present

## 2024-04-01 DIAGNOSIS — M25472 Effusion, left ankle: Secondary | ICD-10-CM | POA: Diagnosis not present

## 2024-04-01 DIAGNOSIS — R591 Generalized enlarged lymph nodes: Secondary | ICD-10-CM | POA: Diagnosis not present

## 2024-04-01 DIAGNOSIS — N920 Excessive and frequent menstruation with regular cycle: Secondary | ICD-10-CM | POA: Diagnosis not present

## 2024-04-01 DIAGNOSIS — D75839 Thrombocytosis, unspecified: Secondary | ICD-10-CM | POA: Diagnosis not present

## 2024-04-01 DIAGNOSIS — Z87891 Personal history of nicotine dependence: Secondary | ICD-10-CM | POA: Diagnosis not present

## 2024-04-01 DIAGNOSIS — D509 Iron deficiency anemia, unspecified: Secondary | ICD-10-CM

## 2024-04-01 DIAGNOSIS — Z800 Family history of malignant neoplasm of digestive organs: Secondary | ICD-10-CM | POA: Diagnosis not present

## 2024-04-01 MED ORDER — SODIUM CHLORIDE 0.9 % IV SOLN: INTRAVENOUS | Status: DC

## 2024-04-01 MED ORDER — ACETAMINOPHEN 325 MG PO TABS
650.0000 mg | ORAL_TABLET | Freq: Once | ORAL | Status: AC
  Administered 2024-04-01: 650 mg via ORAL
  Filled 2024-04-01: qty 2

## 2024-04-01 MED ORDER — IRON SUCROSE 20 MG/ML IV SOLN
200.0000 mg | Freq: Once | INTRAVENOUS | Status: AC
  Administered 2024-04-01: 200 mg via INTRAVENOUS
  Filled 2024-04-01: qty 10

## 2024-04-01 MED ORDER — DIPHENHYDRAMINE HCL 25 MG PO CAPS
25.0000 mg | ORAL_CAPSULE | Freq: Once | ORAL | Status: AC
  Administered 2024-04-01: 25 mg via ORAL
  Filled 2024-04-01: qty 1

## 2024-04-01 NOTE — Patient Instructions (Signed)

## 2024-04-06 ENCOUNTER — Encounter: Payer: Self-pay | Admitting: Family Medicine

## 2024-04-06 MED ORDER — TIRZEPATIDE 10 MG/0.5ML ~~LOC~~ SOAJ: 10.0000 mg | SUBCUTANEOUS | 1 refills | Status: DC

## 2024-04-06 NOTE — Telephone Encounter (Signed)
 Patient wanted to let you know she completed her second tirzepatide  on 11/28 of 7.5mg  and stated based on the plan she is currently on she will need to go up to 10mg . Patient is also traveling the 12/12-12/20 and is wondering a good vitamin to take for immunity support?

## 2024-04-07 NOTE — Telephone Encounter (Signed)
 PA is needed for medication.

## 2024-04-08 ENCOUNTER — Telehealth: Payer: Self-pay

## 2024-04-08 ENCOUNTER — Other Ambulatory Visit (HOSPITAL_COMMUNITY): Payer: Self-pay

## 2024-04-08 ENCOUNTER — Encounter: Payer: Self-pay | Admitting: Oncology

## 2024-04-08 MED ORDER — ZEPBOUND 10 MG/0.5ML ~~LOC~~ SOAJ: 10.0000 mg | SUBCUTANEOUS | 3 refills | Status: AC

## 2024-04-08 NOTE — Telephone Encounter (Signed)
 Pharmacy Patient Advocate Encounter   Received notification from RX Request Messages that prior authorization for Zepbound  10MG /0.5ML Auto-injectors is required/requested.   Insurance verification completed.   The patient is insured through [employer].   Per test claim: The current 28 day co-pay is, $24.99.  No PA needed at this time. This test claim was processed through [facility]- copay amounts may vary at other pharmacies due to pharmacy/plan contracts, or as the patient moves through the different stages of their insurance plan.

## 2024-04-08 NOTE — Addendum Note (Signed)
 Addended by: Danasia Baker E on: 04/08/2024 08:44 PM   Modules accepted: Orders

## 2024-04-08 NOTE — Telephone Encounter (Signed)
 Patient would like to increase Zepbound  to 10mg . She does not want to wait on PA for [facility] .

## 2024-04-10 ENCOUNTER — Inpatient Hospital Stay: Attending: Oncology

## 2024-04-10 VITALS — BP 131/78 | HR 74 | Temp 98.8°F | Resp 18

## 2024-04-10 DIAGNOSIS — Z87891 Personal history of nicotine dependence: Secondary | ICD-10-CM | POA: Insufficient documentation

## 2024-04-10 DIAGNOSIS — D75839 Thrombocytosis, unspecified: Secondary | ICD-10-CM | POA: Diagnosis not present

## 2024-04-10 DIAGNOSIS — D509 Iron deficiency anemia, unspecified: Secondary | ICD-10-CM | POA: Diagnosis present

## 2024-04-10 DIAGNOSIS — Z800 Family history of malignant neoplasm of digestive organs: Secondary | ICD-10-CM | POA: Diagnosis not present

## 2024-04-10 DIAGNOSIS — M25472 Effusion, left ankle: Secondary | ICD-10-CM | POA: Insufficient documentation

## 2024-04-10 DIAGNOSIS — R591 Generalized enlarged lymph nodes: Secondary | ICD-10-CM | POA: Diagnosis not present

## 2024-04-10 DIAGNOSIS — M25471 Effusion, right ankle: Secondary | ICD-10-CM | POA: Diagnosis not present

## 2024-04-10 DIAGNOSIS — N920 Excessive and frequent menstruation with regular cycle: Secondary | ICD-10-CM | POA: Diagnosis not present

## 2024-04-10 MED ORDER — IRON SUCROSE 20 MG/ML IV SOLN
200.0000 mg | Freq: Once | INTRAVENOUS | Status: AC
  Administered 2024-04-10: 200 mg via INTRAVENOUS
  Filled 2024-04-10: qty 10

## 2024-04-10 MED ORDER — SODIUM CHLORIDE 0.9 % IV SOLN: INTRAVENOUS | Status: DC

## 2024-04-10 NOTE — Progress Notes (Signed)
 Pt took tylenol  before coming to infusion appointment. Pt refusing the benadryl  before iron  infusion. Pt waited 30 minutes after infusion. VSS. Had drink and snack. No complaints. Informed to call clinic with any issues or questions.

## 2024-04-10 NOTE — Patient Instructions (Signed)

## 2024-04-17 ENCOUNTER — Inpatient Hospital Stay

## 2024-04-24 ENCOUNTER — Inpatient Hospital Stay

## 2024-05-08 ENCOUNTER — Inpatient Hospital Stay: Attending: Oncology

## 2024-05-08 ENCOUNTER — Encounter: Payer: Self-pay | Admitting: Oncology

## 2024-05-08 VITALS — BP 121/95 | HR 64 | Temp 98.2°F | Resp 13

## 2024-05-08 DIAGNOSIS — M25471 Effusion, right ankle: Secondary | ICD-10-CM | POA: Insufficient documentation

## 2024-05-08 DIAGNOSIS — D75839 Thrombocytosis, unspecified: Secondary | ICD-10-CM | POA: Insufficient documentation

## 2024-05-08 DIAGNOSIS — D509 Iron deficiency anemia, unspecified: Secondary | ICD-10-CM | POA: Insufficient documentation

## 2024-05-08 DIAGNOSIS — R591 Generalized enlarged lymph nodes: Secondary | ICD-10-CM | POA: Diagnosis not present

## 2024-05-08 DIAGNOSIS — Z800 Family history of malignant neoplasm of digestive organs: Secondary | ICD-10-CM | POA: Diagnosis not present

## 2024-05-08 DIAGNOSIS — Z87891 Personal history of nicotine dependence: Secondary | ICD-10-CM | POA: Insufficient documentation

## 2024-05-08 DIAGNOSIS — M25472 Effusion, left ankle: Secondary | ICD-10-CM | POA: Insufficient documentation

## 2024-05-08 DIAGNOSIS — N920 Excessive and frequent menstruation with regular cycle: Secondary | ICD-10-CM | POA: Diagnosis not present

## 2024-05-08 MED ORDER — DIPHENHYDRAMINE HCL 25 MG PO CAPS
25.0000 mg | ORAL_CAPSULE | Freq: Once | ORAL | Status: AC
  Administered 2024-05-08: 25 mg via ORAL
  Filled 2024-05-08: qty 1

## 2024-05-08 MED ORDER — ACETAMINOPHEN 325 MG PO TABS
650.0000 mg | ORAL_TABLET | Freq: Once | ORAL | Status: AC
  Administered 2024-05-08: 650 mg via ORAL
  Filled 2024-05-08: qty 2

## 2024-05-08 MED ORDER — IRON SUCROSE 20 MG/ML IV SOLN
200.0000 mg | Freq: Once | INTRAVENOUS | Status: AC
  Administered 2024-05-08: 200 mg via INTRAVENOUS
  Filled 2024-05-08: qty 10

## 2024-05-08 MED ORDER — SODIUM CHLORIDE 0.9 % IV SOLN: INTRAVENOUS | Status: DC

## 2024-05-08 NOTE — Patient Instructions (Signed)

## 2024-05-15 ENCOUNTER — Inpatient Hospital Stay

## 2024-05-15 VITALS — BP 126/83 | HR 71 | Temp 98.1°F | Resp 15

## 2024-05-15 DIAGNOSIS — D509 Iron deficiency anemia, unspecified: Secondary | ICD-10-CM

## 2024-05-15 MED ORDER — IRON SUCROSE 20 MG/ML IV SOLN
200.0000 mg | Freq: Once | INTRAVENOUS | Status: AC
  Administered 2024-05-15: 200 mg via INTRAVENOUS
  Filled 2024-05-15: qty 10

## 2024-05-15 MED ORDER — SODIUM CHLORIDE 0.9 % IV SOLN: INTRAVENOUS | Status: DC

## 2024-05-15 NOTE — Patient Instructions (Signed)

## 2024-05-22 ENCOUNTER — Inpatient Hospital Stay

## 2024-05-22 VITALS — BP 142/91 | HR 58 | Temp 98.0°F | Resp 18

## 2024-05-22 DIAGNOSIS — D509 Iron deficiency anemia, unspecified: Secondary | ICD-10-CM

## 2024-05-22 MED ORDER — SODIUM CHLORIDE 0.9 % IV SOLN: INTRAVENOUS | Status: DC

## 2024-05-22 MED ORDER — DIPHENHYDRAMINE HCL 25 MG PO CAPS: 25.0000 mg | ORAL_CAPSULE | Freq: Once | ORAL | Status: DC

## 2024-05-22 MED ORDER — IRON SUCROSE 20 MG/ML IV SOLN
200.0000 mg | Freq: Once | INTRAVENOUS | Status: AC
  Administered 2024-05-22: 200 mg via INTRAVENOUS
  Filled 2024-05-22: qty 10

## 2024-05-22 MED ORDER — ACETAMINOPHEN 325 MG PO TABS: 650.0000 mg | ORAL_TABLET | Freq: Once | ORAL | Status: DC

## 2024-05-22 NOTE — Patient Instructions (Signed)

## 2024-05-22 NOTE — Progress Notes (Signed)
 Pt tolerated IV iron  without adverse effects. Completed a 30 minute wait. Dr. Autumn did advise her to remain on oral iron  until her March f/u appt.

## 2024-05-27 ENCOUNTER — Telehealth: Admitting: Physician Assistant

## 2024-05-27 DIAGNOSIS — N760 Acute vaginitis: Secondary | ICD-10-CM | POA: Diagnosis not present

## 2024-05-27 DIAGNOSIS — B379 Candidiasis, unspecified: Secondary | ICD-10-CM

## 2024-05-27 DIAGNOSIS — T3695XA Adverse effect of unspecified systemic antibiotic, initial encounter: Secondary | ICD-10-CM | POA: Diagnosis not present

## 2024-05-27 DIAGNOSIS — B9689 Other specified bacterial agents as the cause of diseases classified elsewhere: Secondary | ICD-10-CM | POA: Diagnosis not present

## 2024-05-27 MED ORDER — METRONIDAZOLE 500 MG PO TABS: 500.0000 mg | ORAL_TABLET | Freq: Two times a day (BID) | ORAL | 0 refills | Status: AC

## 2024-05-27 MED ORDER — FLUCONAZOLE 150 MG PO TABS: 150.0000 mg | ORAL_TABLET | ORAL | 0 refills | Status: AC | PRN

## 2024-05-27 NOTE — Progress Notes (Signed)
 We are sorry that you are not feeling well. Here is how we plan to help! Based on what you shared with me it looks like you: May have a vaginosis due to bacteria  Vaginosis is an inflammation of the vagina that can result in discharge, itching and pain. The cause is usually a change in the normal balance of vaginal bacteria or an infection. Vaginosis can also result from reduced estrogen levels after menopause.  The most common causes of vaginosis are:   Bacterial vaginosis which results from an overgrowth of one on several organisms that are normally present in your vagina.   Yeast infections which are caused by a naturally occurring fungus called candida.   Vaginal atrophy (atrophic vaginosis) which results from the thinning of the vagina from reduced estrogen levels after menopause.   Trichomoniasis which is caused by a parasite and is commonly transmitted by sexual intercourse.  Factors that increase your risk of developing vaginosis include: Medications, such as antibiotics and steroids Uncontrolled diabetes Use of hygiene products such as bubble bath, vaginal spray or vaginal deodorant Douching Wearing damp or tight-fitting clothing Using an intrauterine device (IUD) for birth control Hormonal changes, such as those associated with pregnancy, birth control pills or menopause Sexual activity Having a sexually transmitted infection  Your treatment plan is Metronidazole  or Flagyl  500mg  twice a day for 7 days.  I have electronically sent this prescription into the pharmacy that you have chosen.  Diflucan  given as prophylaxis as patient tends to get vaginal yeast infections with antibiotic use.  Be sure to take all of the medication as directed. Stop taking any medication if you develop a rash, tongue swelling or shortness of breath. Mothers who are breast feeding should consider pumping and discarding their breast milk while on these antibiotics. However, there is no consensus that  infant exposure at these doses would be harmful.  Remember that medication creams can weaken latex condoms.   HOME CARE:  Good hygiene may prevent some types of vaginosis from recurring and may relieve some symptoms:  Avoid baths, hot tubs and whirlpool spas. Rinse soap from your outer genital area after a shower, and dry the area well to prevent irritation. Don't use scented or harsh soaps, such as those with deodorant or antibacterial action. Avoid irritants. These include scented tampons and pads. Wipe from front to back after using the toilet. Doing so avoids spreading fecal bacteria to your vagina.  Other things that may help prevent vaginosis include:  Don't douche. Your vagina doesn't require cleansing other than normal bathing. Repetitive douching disrupts the normal organisms that reside in the vagina and can actually increase your risk of vaginal infection. Douching won't clear up a vaginal infection. Use a latex condom. Both female and female latex condoms may help you avoid infections spread by sexual contact. Wear cotton underwear. Also wear pantyhose with a cotton crotch. If you feel comfortable without it, skip wearing underwear to bed. Yeast thrives in [employer] Your symptoms should improve in the next day or two.  GET HELP RIGHT AWAY IF:  You have pain in your lower abdomen ( pelvic area or over your ovaries) You develop nausea or vomiting You develop a fever Your discharge changes or worsens You have persistent pain with intercourse You develop shortness of breath, a rapid pulse, or you faint.  These symptoms could be signs of problems or infections that need to be evaluated by a medical provider now.  MAKE SURE YOU   Understand  these instructions. Will watch your condition. Will get help right away if you are not doing well or get worse.  Your e-visit answers were reviewed by a board certified advanced clinical practitioner to complete your personal care  plan. Depending upon the condition, your plan could have included both over the counter or prescription medications. Please review your pharmacy choice to make sure that you have choses a pharmacy that is open for you to pick up any needed prescription, Your safety is important to us . If you have drug allergies check your prescription carefully.   You can use MyChart to ask questions about today's visit, request a non-urgent call back, or ask for a work or school excuse for 24 hours related to this e-Visit. If it has been greater than 24 hours you will need to follow up with your provider, or enter a new e-Visit to address those concerns. You will get a MyChart message within the next two days asking about your experience. I hope that your e-visit has been valuable and will speed your recovery.   I have spent 5 minutes in review of e-visit questionnaire, review and updating patient chart, medical decision making and response to patient.   Delon CHRISTELLA Dickinson, PA-C

## 2024-05-31 ENCOUNTER — Encounter: Payer: Self-pay | Admitting: Oncology

## 2024-06-30 ENCOUNTER — Encounter: Admitting: Family Medicine

## 2024-07-07 ENCOUNTER — Inpatient Hospital Stay: Admitting: Oncology

## 2024-07-07 ENCOUNTER — Inpatient Hospital Stay
